# Patient Record
Sex: Male | Born: 1940 | Race: White | Hispanic: No | Marital: Married | Smoking: Former smoker
Health system: Southern US, Community
[De-identification: ages and names within clinical notes are randomized; demographics above are authoritative.]

## PROBLEM LIST (undated history)

## (undated) DIAGNOSIS — I5032 Chronic diastolic (congestive) heart failure: Secondary | ICD-10-CM

## (undated) DIAGNOSIS — I482 Chronic atrial fibrillation, unspecified: Secondary | ICD-10-CM

## (undated) DIAGNOSIS — E119 Type 2 diabetes mellitus without complications: Secondary | ICD-10-CM

## (undated) DIAGNOSIS — K56699 Other intestinal obstruction unspecified as to partial versus complete obstruction: Secondary | ICD-10-CM

## (undated) DIAGNOSIS — I2699 Other pulmonary embolism without acute cor pulmonale: Secondary | ICD-10-CM

## (undated) DIAGNOSIS — J9621 Acute and chronic respiratory failure with hypoxia: Secondary | ICD-10-CM

## (undated) DIAGNOSIS — K5120 Ulcerative (chronic) proctitis without complications: Secondary | ICD-10-CM

## (undated) DIAGNOSIS — K5190 Ulcerative colitis, unspecified, without complications: Secondary | ICD-10-CM

## (undated) DIAGNOSIS — U071 COVID-19: Secondary | ICD-10-CM

## (undated) DIAGNOSIS — Z85828 Personal history of other malignant neoplasm of skin: Secondary | ICD-10-CM

## (undated) DIAGNOSIS — I4891 Unspecified atrial fibrillation: Secondary | ICD-10-CM

## (undated) DIAGNOSIS — J84112 Idiopathic pulmonary fibrosis: Secondary | ICD-10-CM

## (undated) DIAGNOSIS — I2510 Atherosclerotic heart disease of native coronary artery without angina pectoris: Secondary | ICD-10-CM

## (undated) HISTORY — DX: Ulcerative colitis, unspecified, without complications: K51.90

## (undated) HISTORY — PX: OTHER SURGICAL HISTORY: SHX169

## (undated) HISTORY — DX: Personal history of other malignant neoplasm of skin: Z85.828

## (undated) HISTORY — DX: Unspecified atrial fibrillation: I48.91

## (undated) HISTORY — DX: Type 2 diabetes mellitus without complications: E11.9

## (undated) HISTORY — DX: Atherosclerotic heart disease of native coronary artery without angina pectoris: I25.10

## (undated) HISTORY — DX: Ulcerative (chronic) proctitis without complications: K51.20

---

## 2003-09-02 ENCOUNTER — Emergency Department (HOSPITAL_COMMUNITY): Admission: EM | Admit: 2003-09-02 | Discharge: 2003-09-02 | Payer: Self-pay | Admitting: Emergency Medicine

## 2003-09-02 ENCOUNTER — Encounter: Payer: Self-pay | Admitting: Emergency Medicine

## 2003-09-03 ENCOUNTER — Inpatient Hospital Stay (HOSPITAL_COMMUNITY): Admission: RE | Admit: 2003-09-03 | Discharge: 2003-09-11 | Payer: Self-pay | Admitting: Internal Medicine

## 2007-03-03 ENCOUNTER — Ambulatory Visit: Payer: Self-pay | Admitting: Gastroenterology

## 2007-03-03 LAB — CONVERTED CEMR LAB
ALT: 14 units/L (ref 0–40)
AST: 13 units/L (ref 0–37)
Albumin: 3.7 g/dL (ref 3.5–5.2)
Alkaline Phosphatase: 39 units/L (ref 39–117)
Amylase: 42 units/L (ref 27–131)
BUN: 15 mg/dL (ref 6–23)
Basophils Absolute: 0 10*3/uL (ref 0.0–0.1)
Basophils Relative: 0.5 % (ref 0.0–1.0)
Bilirubin, Direct: 0.1 mg/dL (ref 0.0–0.3)
CO2: 29 meq/L (ref 19–32)
Calcium: 9 mg/dL (ref 8.4–10.5)
Chloride: 106 meq/L (ref 96–112)
Creatinine, Ser: 0.9 mg/dL (ref 0.4–1.5)
Eosinophils Absolute: 0.3 10*3/uL (ref 0.0–0.6)
Eosinophils Relative: 3.3 % (ref 0.0–5.0)
Folate: >20 ng/mL
GFR calc Af Amer: 109 mL/min
GFR calc non Af Amer: 90 mL/min
Glucose, Bld: 108 mg/dL — ABNORMAL HIGH (ref 70–99)
HCT: 40.9 % (ref 39.0–52.0)
Hemoglobin: 14 g/dL (ref 13.0–17.0)
Lipase: 22 units/L (ref 11.0–59.0)
Lymphocytes Relative: 25.9 % (ref 12.0–46.0)
MCHC: 34.1 g/dL (ref 30.0–36.0)
MCV: 94.7 fL (ref 78.0–100.0)
Monocytes Absolute: 0.8 10*3/uL — ABNORMAL HIGH (ref 0.2–0.7)
Monocytes Relative: 9.6 % (ref 3.0–11.0)
Neutro Abs: 4.8 10*3/uL (ref 1.4–7.7)
Neutrophils Relative %: 60.7 % (ref 43.0–77.0)
Platelets: 310 10*3/uL (ref 150–400)
Potassium: 4.2 meq/L (ref 3.5–5.1)
RBC: 4.32 M/uL (ref 4.22–5.81)
RDW: 12.3 % (ref 11.5–14.6)
Sed Rate: 17 mm/hr (ref 0–20)
Sodium: 140 meq/L (ref 135–145)
TSH: 2.52 microintl units/mL (ref 0.35–5.50)
Tissue Transglutaminase Ab, IgA: <3 units (ref <5)
Total Bilirubin: 0.6 mg/dL (ref 0.3–1.2)
Total Protein: 6.6 g/dL (ref 6.0–8.3)
Vitamin B-12: 437 pg/mL (ref 211–911)
WBC: 8 10*3/uL (ref 4.5–10.5)

## 2007-03-06 ENCOUNTER — Encounter: Payer: Self-pay | Admitting: Gastroenterology

## 2007-03-10 ENCOUNTER — Encounter (INDEPENDENT_AMBULATORY_CARE_PROVIDER_SITE_OTHER): Payer: Self-pay | Admitting: Specialist

## 2007-03-10 ENCOUNTER — Ambulatory Visit: Payer: Self-pay | Admitting: Gastroenterology

## 2007-03-14 ENCOUNTER — Ambulatory Visit: Payer: Self-pay | Admitting: Cardiology

## 2008-07-31 ENCOUNTER — Ambulatory Visit: Payer: Self-pay | Admitting: Cardiology

## 2008-08-06 ENCOUNTER — Ambulatory Visit: Payer: Self-pay | Admitting: Cardiology

## 2008-08-13 ENCOUNTER — Ambulatory Visit: Payer: Self-pay | Admitting: Cardiology

## 2008-08-20 ENCOUNTER — Ambulatory Visit: Payer: Self-pay | Admitting: Cardiology

## 2008-08-23 ENCOUNTER — Ambulatory Visit: Payer: Self-pay | Admitting: Cardiology

## 2008-09-03 ENCOUNTER — Ambulatory Visit: Payer: Self-pay | Admitting: Cardiology

## 2009-01-13 ENCOUNTER — Emergency Department (HOSPITAL_COMMUNITY): Admission: EM | Admit: 2009-01-13 | Discharge: 2009-01-13 | Payer: Self-pay | Admitting: Emergency Medicine

## 2009-01-14 ENCOUNTER — Telehealth: Payer: Self-pay | Admitting: Gastroenterology

## 2009-03-15 ENCOUNTER — Ambulatory Visit: Payer: Self-pay | Admitting: Cardiology

## 2009-03-15 ENCOUNTER — Encounter: Payer: Self-pay | Admitting: Cardiology

## 2009-03-15 ENCOUNTER — Inpatient Hospital Stay (HOSPITAL_COMMUNITY): Admission: EM | Admit: 2009-03-15 | Discharge: 2009-03-19 | Payer: Self-pay | Admitting: Cardiology

## 2009-03-16 ENCOUNTER — Ambulatory Visit: Payer: Self-pay | Admitting: Gastroenterology

## 2009-03-16 ENCOUNTER — Encounter: Payer: Self-pay | Admitting: Cardiology

## 2009-04-02 ENCOUNTER — Encounter: Payer: Self-pay | Admitting: Cardiology

## 2009-04-09 ENCOUNTER — Ambulatory Visit: Payer: Self-pay | Admitting: Cardiology

## 2009-04-14 ENCOUNTER — Encounter: Payer: Self-pay | Admitting: Cardiology

## 2009-04-25 ENCOUNTER — Encounter: Payer: Self-pay | Admitting: Cardiology

## 2009-05-14 DIAGNOSIS — I482 Chronic atrial fibrillation, unspecified: Secondary | ICD-10-CM

## 2009-05-14 DIAGNOSIS — I2510 Atherosclerotic heart disease of native coronary artery without angina pectoris: Secondary | ICD-10-CM

## 2009-05-14 DIAGNOSIS — Z85828 Personal history of other malignant neoplasm of skin: Secondary | ICD-10-CM

## 2009-05-28 ENCOUNTER — Encounter (INDEPENDENT_AMBULATORY_CARE_PROVIDER_SITE_OTHER): Payer: Self-pay | Admitting: *Deleted

## 2009-06-23 ENCOUNTER — Encounter: Payer: Self-pay | Admitting: *Deleted

## 2009-12-12 ENCOUNTER — Telehealth (INDEPENDENT_AMBULATORY_CARE_PROVIDER_SITE_OTHER): Payer: Self-pay | Admitting: *Deleted

## 2009-12-16 ENCOUNTER — Encounter: Payer: Self-pay | Admitting: Cardiology

## 2010-03-30 ENCOUNTER — Ambulatory Visit: Payer: Self-pay | Admitting: Cardiology

## 2010-03-30 ENCOUNTER — Encounter: Payer: Self-pay | Admitting: Cardiology

## 2010-03-31 ENCOUNTER — Encounter: Payer: Self-pay | Admitting: Cardiology

## 2010-04-24 ENCOUNTER — Encounter: Payer: Self-pay | Admitting: Cardiology

## 2010-04-27 ENCOUNTER — Ambulatory Visit: Payer: Self-pay | Admitting: Cardiology

## 2010-04-30 ENCOUNTER — Telehealth (INDEPENDENT_AMBULATORY_CARE_PROVIDER_SITE_OTHER): Payer: Self-pay | Admitting: *Deleted

## 2010-05-07 ENCOUNTER — Encounter: Payer: Self-pay | Admitting: Physician Assistant

## 2010-05-07 ENCOUNTER — Ambulatory Visit: Payer: Self-pay | Admitting: Cardiology

## 2010-05-07 DIAGNOSIS — R0602 Shortness of breath: Secondary | ICD-10-CM | POA: Insufficient documentation

## 2010-05-07 DIAGNOSIS — R55 Syncope and collapse: Secondary | ICD-10-CM | POA: Insufficient documentation

## 2010-05-14 ENCOUNTER — Encounter: Payer: Self-pay | Admitting: Cardiology

## 2010-05-15 ENCOUNTER — Encounter: Payer: Self-pay | Admitting: Cardiology

## 2010-05-19 ENCOUNTER — Ambulatory Visit: Payer: Self-pay | Admitting: Cardiology

## 2010-05-21 ENCOUNTER — Encounter: Payer: Self-pay | Admitting: Cardiology

## 2010-05-25 ENCOUNTER — Encounter: Payer: Self-pay | Admitting: Cardiology

## 2010-05-28 ENCOUNTER — Telehealth (INDEPENDENT_AMBULATORY_CARE_PROVIDER_SITE_OTHER): Payer: Self-pay | Admitting: *Deleted

## 2010-06-03 ENCOUNTER — Encounter: Payer: Self-pay | Admitting: Cardiology

## 2010-06-08 ENCOUNTER — Encounter (INDEPENDENT_AMBULATORY_CARE_PROVIDER_SITE_OTHER): Payer: Self-pay | Admitting: *Deleted

## 2010-12-08 NOTE — Assessment & Plan Note (Signed)
Summary: per dr. Andee Lineman- seen in ER on 6-17  Medications Added NITROSTAT 0.4 MG SUBL (NITROGLYCERIN) 1 under tongue every up to 3 doses for chest pain,if no relief proceed to ED VALIUM 5 MG TABS (DIAZEPAM) Take 1 tablet by mouth three times a day as needed VICODIN 5-500 MG TABS (HYDROCODONE-ACETAMINOPHEN) as needed AMOXICILLIN 500 MG CAPS (AMOXICILLIN) Take 1 tablet by mouth three times a day AMLODIPINE BESYLATE 5 MG TABS (AMLODIPINE BESYLATE) Take 1 tablet by mouth once a day SIMVASTATIN 20 MG TABS (SIMVASTATIN) Take 1 tab by mouth at bedtime      Allergies Added: NKDA  Visit Type:  ED follow-up Primary Provider:  [facility]([place])   History of Present Illness: the patient is a 70 year old male who was recently seen in the emergency room department because of substernal chest pain.  The patient apparently was in the hyperbaric chamber for a wound to the left foot and had an episode of chest discomfort neck and throat pain.  The patient took two sublingual nitroglycerin which relieved his pain.  The patient denied any chest discomfort at the time when he was seen in the emergency room.  There are no acute ischemic changes his vital signs were within normal limits.  His oxygen saturation was within normal limits.  A cardiac component was within normal limits and the emergency room physician did not report any EKG changes.  Subsequently patient was scheduled for Cardiolite imaging study the patient had normal ejection fraction with no evidence of ischemia.  The patient states that since he has been discharged from emergency room has had no further chest pain.  He does report myalgias related to Lipitor.  He also reports some reflux symptoms.  The patient has a prior history of non-ST elevation myocardial infarction.  His last catheterization was in the 2010 and was found to have an occluded RCA at the prior stent site.yet good collaterals from the left to the right.  The  patient also reports significant anxiety.  The patient does have history of dizzy spells and has worn a Holter monitor in the past which showed no significant arrhythmia.    The patient is a prior history of pulmonary embolism and ulcerative colitis.  He also has permanent atrial fibrillation.  Interestingly however when his EKG during stress testing had normal sinus rhythm.  Preventive Screening-Counseling & Management  Alcohol-Tobacco     Smoking Status: quit     Year Quit: 1980's  Current Medications (verified): 1)  Apriso 0.375 Gm Xr24h-Cap (Mesalamine) .... Take 1 Tablet By Mouth Four Times A Day 2)  Aspir-Low 81 Mg Tbec (Aspirin) .... Take 1 Tablet By Mouth Once A Day 3)  Nitrostat 0.4 Mg Subl (Nitroglycerin) .Marland Kitchen.. 1 Under Tongue Every Up To 3 Doses For Chest Pain,if No Relief Proceed To Ed 4)  Valium 5 Mg Tabs (Diazepam) .... Take 1 Tablet By Mouth Three Times A Day As Needed 5)  Vicodin 5-500 Mg Tabs (Hydrocodone-Acetaminophen) .... As Needed 6)  Amoxicillin 500 Mg Caps (Amoxicillin) .... Take 1 Tablet By Mouth Three Times A Day 7)  Amlodipine Besylate 5 Mg Tabs (Amlodipine Besylate) .... Take 1 Tablet By Mouth Once A Day 8)  Simvastatin 20 Mg Tabs (Simvastatin) .... Take 1 Tab By Mouth At Bedtime  Allergies (verified): No Known Drug Allergies  Comments:  Nurse/Medical Assistant: The patient's medications and allergies were verbally reviewed with the patient and were updated in the Medication and Allergy Lists.  Past History:  Past Medical History: Last updated: 05/14/2009 ATRIAL FIBRILLATION (ICD-427.31) DM (ICD-250.00) SKIN CANCER, HX OF (ICD-V10.83) ULCERATIVE COLITIS (ICD-556.9) CAD (ICD-414.00)  Past Surgical History: Last updated: 05/14/2009  Multiple left leg surgeries secondary to   traumas, umbilical hernia repair  Family History: Last updated: 05/14/2009  No inflammatory bowel disease.  Grandfather had   colorectal cancer.   Social History: Last  updated: 05/14/2009  He is married.  He has 2 children.  He does not drink   alcohol, smoke cigarettes.   Social History: Smoking Status:  quit  Review of Systems       The patient complains of chest pain, dizziness, and anxiety.  The patient denies fatigue, malaise, fever, weight gain/loss, vision loss, decreased hearing, hoarseness, palpitations, shortness of breath, prolonged cough, wheezing, sleep apnea, coughing up blood, abdominal pain, blood in stool, nausea, vomiting, diarrhea, heartburn, incontinence, blood in urine, muscle weakness, joint pain, leg swelling, rash, skin lesions, headache, fainting, depression, enlarged lymph nodes, easy bruising or bleeding, and environmental allergies.    Vital Signs:  Patient profile:   70 year old male Height:      72 inches Weight:      257 pounds BMI:     34.98 Pulse rate:   75 / minute BP sitting:   144 / 73  (left arm) Cuff size:   large  Vitals Entered By: Carlye Grippe (April 27, 2010 10:34 AM)  Nutrition Counseling: Patient's BMI is greater than 25 and therefore counseled on weight management options.  Physical Exam  Additional Exam:  General: Well-developed, well-nourished in no distress head: Normocephalic and atraumatic eyes PERRLA/EOMI intact, conjunctiva and lids normal nose: No deformity or lesions mouth normal dentition, normal posterior pharynx neck: Supple, no JVD.  No masses, thyromegaly or abnormal cervical nodes lungs: Normal breath sounds bilaterally without wheezing.  Normal percussion heart: regular rate and rhythm with normal S1 and S2, no S3 or S4.  PMI is normal.  No pathological murmurs abdomen: Normal bowel sounds, abdomen is soft and nontender without masses, organomegaly or hernias noted.  No hepatosplenomegaly musculoskeletal: Back normal, normal gait muscle strength and tone normal pulsus: Pulse is normal in all 4 extremities Extremities: No peripheral pitting edema neurologic: Alert and oriented x  3 skin: Intact without lesions or rashes cervical nodes: No significant adenopathy psychologic: Normal affect    Impression & Recommendations:  Problem # 1:  ATRIAL FIBRILLATION (ICD-427.31) the patient reportedly has history of permanent atrial fibrillation however during his last stress testhe was in normal sinus rhythm.  In the emergency room however he was in atrial fibrillation.  The patient has a history of dizziness but reports no definite syncopal episodes or presyncope His updated medication list for this problem includes:    Aspir-low 81 Mg Tbec (Aspirin) .Marland Kitchen... Take 1 tablet by mouth once a day  Problem # 2:  CAD (ICD-414.00) the patient has known coronary artery disease.  He had chest pain in the hyperbaric chamber the cardiac enzymes were negative and there were no EKG changes.  We will intensify the patient's antianginal regimen with amlodipine 5 mg p.o. daily.  He will also be started on statin 20 mg p.o. nightly.  A lipid panel LFTs will be obtained in 4 weeks. His updated medication list for this problem includes:    Aspir-low 81 Mg Tbec (Aspirin) .Marland Kitchen... Take 1 tablet by mouth once a day    Nitrostat 0.4 Mg Subl (Nitroglycerin) .Marland Kitchen... 1 under tongue every up to 3 doses  for chest pain,if no relief proceed to ed    Amlodipine Besylate 5 Mg Tabs (Amlodipine besylate) .Marland Kitchen... Take 1 tablet by mouth once a day  Future Orders: T-Lipid Profile ([id number]) ... 05/18/2010 T-Hepatic Function [id number]) ... 05/18/2010  Problem # 3:  ULCERATIVE COLITIS (ICD-556.9) Assessment: Comment Only  Patient Instructions: 1)  Amlodipine 5mg  daily 2)  Simvastatin 20mg  at bedtime  3)  Labs just before next visit - already scheduled for Monday, July 18 at 3:15. 4)  Follow up in  as above. Prescriptions: SIMVASTATIN 20 MG TABS (SIMVASTATIN) Take 1 tab by mouth at bedtime  #30 x 6   Entered by:   Hoover Brunette, LPN   Authorized by:   Lewayne Bunting, MD, [facility]   Signed by:   Hoover Brunette, LPN on  24/40/1027   Method used:   Electronically to        [facility]  [address] [address] [id number] (retail)       [address]       [place], [place]  [address]       Ph: [phone] or [phone]       Fax: [phone]   RxID:   [id number] AMLODIPINE BESYLATE 5 MG TABS (AMLODIPINE BESYLATE) Take 1 tablet by mouth once a day  #30 x 6   Entered by:   Hoover Brunette, LPN   Authorized by:   Lewayne Bunting, MD, [facility]   Signed by:   Hoover Brunette, LPN on 32/20/2542   Method used:   Electronically to        [facility]  [address] [id number] (retail)       [address]       [place], [place]  [address]       Ph: [phone] or [phone]       Fax: [phone]   RxID:   [id number]   I have personnaly reviewed all medications changes and approved new prescriptions and refills. Lewayne Bunting, MD, [facility]  April 27, 2010 11:08 AM

## 2010-12-08 NOTE — Progress Notes (Signed)
Summary: d/c cardionet monitor   Phone Note Outgoing Call   Summary of Call: Spoke with daughter Irish Elders) States she is going to call [employer] to let them know she will be returning monitor.  Dad has wore x 1 wk. approximately.  States lead had came off, wasn't aware.  Can't hear monitor beeping so he took it off.   Hoover Brunette, LPN  May 28, 2010 11:35 AM   Follow-up for Phone Call        OK make sure we have the one week data.  Follow-up by: Lewayne Bunting, MD, [facility],  May 28, 2010 4:45 PM

## 2010-12-08 NOTE — Miscellaneous (Signed)
Summary: CHEST XRAY  Clinical Lists Changes  Orders: Added new Test order of T-Chest x-ray, 2 views ([id number]) - Signed

## 2010-12-08 NOTE — Letter (Signed)
Summary: Appointment -missed  [facility] [address]   [place], [place] [address]   Phone: [phone]  Fax: [phone]     May 25, 2010 MRN: [id number]     ALYAAN BUDZYNSKI [address] [place], [place]  [address]     Dear Mr. APUZZO,  Our records indicate you missed your appointment on May 25, 2010                        with Dr.  Andee Lineman.   It is very important that we reach you to reschedule this appointment. We look forward to participating in your health care needs.   Please contact us at the number listed above at your earliest convenience to reschedule this appointment.   Sincerely,    Glass blower/designer

## 2010-12-08 NOTE — Progress Notes (Signed)
Summary: Dgt concerned about syncope   Phone Note Call from Patient Call back at Home Phone [phone] Call back at [phone]   Summary of Call: Pt's dgt, Irish Elders, left message on my voicemail asking for a return call. She states she would like to discuss her father's condition with someone. She states he has been blacking out and isn't sure he's told us the "whole" situation. She states he has had multiple falls that he doesn't remember including a fall today. She wants to know if further testing needs to be done for this such as carotids or possibly a CT of the head.   Attempted to reach pt's dgt. Left message to notify pt that we could not discuss the pt's medical care with her because the pt has not signed a release of information stating we can speak to her. Notified pt's dgt in the message that nurse would send a message to the MD to notify him of her concerns but that any communication would have to be with the pt, unless he signs a release stating we can speak with her.  Initial call taken by: Cyril Loosen, RN, BSN,  April 30, 2010 5:05 PM  Follow-up for Phone Call        I suggest that we obtain aCardioNet monitor and a possible ventilation perfusion scan if he confirms that he truly had falls and syncope.  However this was not the history given to me in the office.  I saw the patient because he had chest pain in the hyperbaric chamber.  He does have a long-standing history of dizziness which has been evaluated several years ago with a Holter monitor.  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  I suggest we obtain a d-dimer level and then the VQ scan if positive.  Would also guaiaced and apply CardioNet monitor on him and have him see me back in 3 weeks again in the office.  A would also call the patient to make sure that he indeed has episodes of falls and loss of consciousness.  If not we should reschedule the appointment in  the next 4 couple of weeks which had been his daughter so he can get an adequate history. Follow-up by: Lewayne Bunting, MD, [facility],  April 30, 2010 6:57 PM  Additional Follow-up for Phone Call Additional follow up Details #1::        Daughter notified per Dr. Andee Lineman to bring in for OV to discuss above.  OV scheduled for 6/30 at 8:30.   Hoover Brunette, LPN  May 05, 2010 4:13 PM

## 2010-12-08 NOTE — Letter (Signed)
Summary: [facility] D/C DR. PARSONS  [facility] D[facility]C DR. [facility]SONS   Imported By: Zachary George 05/06/2010 18:26:18  _____________________________________________________________________  External Attachment:    Type:   Image     Comment:   External Document

## 2010-12-08 NOTE — Miscellaneous (Signed)
Summary: Orders Update - VQ Scan  Clinical Lists Changes  Orders: Added new Referral order of Nuclear Med (Nuc Med) - Signed

## 2010-12-08 NOTE — Assessment & Plan Note (Signed)
Summary: syncope --agh  Medications Added DIAZEPAM 10 MG TABS (DIAZEPAM) take one by mouth prior to HBO HYDROCODONE-ACETAMINOPHEN 10-500 MG TABS (HYDROCODONE-ACETAMINOPHEN) take 1-2 by mouth every 6-8 hours as needed BENADRYL 25 MG CAPS (DIPHENHYDRAMINE HCL) as needed      Allergies Added: NKDA  Visit Type:  Follow-up Primary Provider:  [facility]([place])  CC:  daughter want to discuss recent c/o SOB, fatigue, and frequent falls.  History of Present Illness: the patient is a 70 year old male with a history of substernal chest pain while in hyperbaric chamber for a wound to the left foot. He was evaluated on 6-22,001 in the office. He was in atrial fibrillation at that time. The patient also has a history of coronary artery disease. He was evaluated in emergency room however his EKG and enzymes were negative. Finger in spite of the patient's antianginal regimen with amlodipine and start him on statin. I did not feel he needs a stress test of time.  His daughter is now weaned him. She is a Engineer, civil (consulting) who works for Dr. Hyacinth Meeker in [place]. She is concerned about his frequent falls. The patient did not mention this during the last office visit. Apparently had an episode of fall last week when he was walking. He does not recall the circumstances it appeared to have blacked out and found himself on the ground. When he stood back up he felt quite dizzy. He denies any palpitations however. In the office today the patient again is quite drowsy and dizzy. However it took Valium, hydrocodone and several tendrils last night. Had a hard time waking up this morning. He has not been placed on Coumadin because of prior history of bleeding with ulcerative colitis. He also stated that yesterday had a bad episode of diarrhea as well as this morning. Recheck orthostatics in the office in a patient was not orthostatic.  The patient appeared confused in the office today and had some memory loss.  His daughter is concerned about from embolic disease from his atrial fibrillation which is a valid consideration. It is also unclear the patient has arrhythmias. He does have a prior history of PE and DVT but clinically did not appear to have a DVT in his legs.    Preventive Screening-Counseling & Management  Alcohol-Tobacco     Smoking Status: quit     Year Quit: 1980's  Current Medications (verified): 1)  Apriso 0.375 Gm Xr24h-Cap (Mesalamine) .... Take 1 Tablet By Mouth Four Times A Day 2)  Aspir-Low 81 Mg Tbec (Aspirin) .... Take 1 Tablet By Mouth Once A Day 3)  Nitrostat 0.4 Mg Subl (Nitroglycerin) .Marland Kitchen.. 1 Under Tongue Every Up To 3 Doses For Chest Pain,if No Relief Proceed To Ed 4)  Diazepam 10 Mg Tabs (Diazepam) .... Take One By Mouth Prior To Hbo 5)  Hydrocodone-Acetaminophen 10-500 Mg Tabs (Hydrocodone-Acetaminophen) .... Take 1-2 By Mouth Every 6-8 Hours As Needed 6)  Amoxicillin 500 Mg Caps (Amoxicillin) .... Take 1 Tablet By Mouth Three Times A Day 7)  Amlodipine Besylate 5 Mg Tabs (Amlodipine Besylate) .... Take 1 Tablet By Mouth Once A Day 8)  Simvastatin 20 Mg Tabs (Simvastatin) .... Take 1 Tab By Mouth At Bedtime 9)  Benadryl 25 Mg Caps (Diphenhydramine Hcl) .... As Needed  Allergies (verified): No Known Drug Allergies  Comments:  Nurse/Medical Assistant: The patient's medication bottles and allergies were reviewed with the patient and were updated in the Medication and Allergy Lists.  Past History:  Past Medical  History: Last updated: 05/14/2009 ATRIAL FIBRILLATION (ICD-427.31) DM (ICD-250.00) SKIN CANCER, HX OF (ICD-V10.83) ULCERATIVE COLITIS (ICD-556.9) CAD (ICD-414.00)  Past Surgical History: Last updated: 05/14/2009  Multiple left leg surgeries secondary to   traumas, umbilical hernia repair  Family History: Last updated: 05/14/2009  No inflammatory bowel disease.  Grandfather had   colorectal cancer.   Social History: Last updated:  05/14/2009  He is married.  He has 2 children.  He does not drink   alcohol, smoke cigarettes.   Risk Factors: Smoking Status: quit (05/07/2010)  Review of Systems       The patient complains of fatigue, shortness of breath, and fainting.  The patient denies malaise, fever, weight gain/loss, vision loss, decreased hearing, hoarseness, chest pain, palpitations, prolonged cough, wheezing, sleep apnea, coughing up blood, abdominal pain, blood in stool, nausea, vomiting, diarrhea, heartburn, incontinence, blood in urine, muscle weakness, joint pain, leg swelling, rash, skin lesions, headache, dizziness, depression, anxiety, enlarged lymph nodes, easy bruising or bleeding, and environmental allergies.    Vital Signs:  Patient profile:   70 year old male Height:      72 inches Weight:      256 pounds O2 Sat:      96 % on Room air Pulse rate:   77 / minute Pulse (ortho):   68 / minute BP sitting:   110 / 76  (left arm) BP standing:   119 / 78 Cuff size:   large  Vitals Entered By: Carlye Grippe (May 07, 2010 8:39 AM)  O2 Flow:  Room air  Serial Vital Signs/Assessments:  Time      Position  BP       Pulse  Resp  Temp     By 9:29 AM   Lying RA  114/76   62                    Gayle [address] LPN 1:61 AM   Sitting   124/78   [address] LPN 0:96 AM   Standing  119/78   68                    Gayle [place], LPN  Comments: 0:45 AM AFTER 2 MIN:  124/71  71 AFTER 3 MIN:  125/87  81 By: Hoover Brunette, LPN   CC: daughter want to discuss recent c/o SOB,fatigue,frequent falls   Physical Exam  Additional Exam:  General: Well-developed, well-nourished in no distress head: Normocephalic and atraumatic eyes PERRLA/EOMI intact, conjunctiva and lids normal nose: No deformity or lesions mouth normal dentition, normal posterior pharynx neck: Supple, no JVD.  No masses, thyromegaly or abnormal cervical nodes lungs: Normal breath sounds bilaterally without wheezing.  Normal  percussion heart: regular rate and rhythm with normal S1 and S2, no S3 or S4.  PMI is normal.  No pathological murmurs abdomen: Normal bowel sounds, abdomen is soft and nontender without masses, organomegaly or hernias noted.  No hepatosplenomegaly musculoskeletal: Back normal, normal gait muscle strength and tone normal pulsus: Pulse is normal in all 4 extremities Extremities: No peripheral pitting edema neurologic: Alert and oriented x 3 skin: Intact without lesions or rashes cervical nodes: No significant adenopathy psychologic: Normal affect    Impression & Recommendations:  Problem # 1:  SYNCOPE AND COLLAPSE (ICD-780.2) is not clear why the patient had syncope. He was not orthostatic. We will check  a cardiac monitor to rule out any significant arrhythmias. He has presumed normal LV function. The patient does have a history of non-ST elevation myocardial infarction with a chronically occluded RCA but with good left large collaterals. Given his history of pulmonary embolism we will also check a d-dimer and if positive we will proceed with lower extremity venous Dopplers. According to the Wells criteria his score is 1.5, in essence a score in between PE unlikely versus low risk. If the patient's lower extremity venous Dopplers are within normal limits that he can proceed with ventilation/perfusion scan. Unless the ventilation/perfusion scan is high probability, the diagnosis of PE can be refuted.I am also concerned the patient could be overmedicating himself with sedatives. His updated medication list for this problem includes:    Aspir-low 81 Mg Tbec (Aspirin) .Marland Kitchen... Take 1 tablet by mouth once a day    Nitrostat 0.4 Mg Subl (Nitroglycerin) .Marland Kitchen... 1 under tongue every up to 3 doses for chest pain,if no relief proceed to ed    Amlodipine Besylate 5 Mg Tabs (Amlodipine besylate) .Marland Kitchen... Take 1 tablet by mouth once a day  Orders: MRI without contrast (MRI w/o contrast) T- * Misc. Laboratory  test [id number]) Cardionet/Event Monitor (Cardionet/Event)  Problem # 2:  ATRIAL FIBRILLATION (ICD-427.31) the patient could have rapid tachyarrhythmias. In the office however atrial fibrillation has been controlled. We'll check a CardioNet monitor. We will also obtain an MRI MRA of the brain. The patient will need Valium 10 mg prior to study due to his claustrophobia. His updated medication list for this problem includes:    Aspir-low 81 Mg Tbec (Aspirin) .Marland Kitchen... Take 1 tablet by mouth once a day  Orders: T- * Misc. Laboratory test [id number]) Cardionet/Event Monitor (Cardionet/Event)  Problem # 3:  ULCERATIVE COLITIS (ICD-556.9) patient will start on Coumadin because of ulcerative colitis with bleeding episodes  Problem # 4:  CAD (ICD-414.00) no clear evidence of angina. Will hold off on stress testing for now. His updated medication list for this problem includes:    Aspir-low 81 Mg Tbec (Aspirin) .Marland Kitchen... Take 1 tablet by mouth once a day    Nitrostat 0.4 Mg Subl (Nitroglycerin) .Marland Kitchen... 1 under tongue every up to 3 doses for chest pain,if no relief proceed to ed    Amlodipine Besylate 5 Mg Tabs (Amlodipine besylate) .Marland Kitchen... Take 1 tablet by mouth once a day  Other Orders: Venous Duplex Lower Extremity (Venous Dup Lower E)  Patient Instructions: 1)  MRI/MRA of brain - no contrast, can take Valium 10mg  30 minutes before  2)  Lab:  D-Dimer 3)  Cardionet monitor  4)  Follow up in  3 months

## 2010-12-08 NOTE — Progress Notes (Signed)
Summary: med preload  Medications Added LIPITOR 40 MG TABS (ATORVASTATIN CALCIUM) Take 1 tablet by mouth once a day APRISO 0.375 GM XR24H-CAP (MESALAMINE) Take 1 tablet by mouth four times a day ASPIR-LOW 81 MG TBEC (ASPIRIN) Take 1 tablet by mouth once a day FERROUS SULFATE 325 (65 FE) MG TABS (FERROUS SULFATE) Take 1 tablet by mouth two times a day PREDNISONE 20 MG TABS (PREDNISONE) Take 1&1/2 tablet by mouth once a day LOMOTIL 2.5-0.025 MG TABS (DIPHENOXYLATE-ATROPINE) Take 1 tablet by mouth two times a day as needed COLOCORT 100 MG/60ML ENEM (HYDROCORTISONE) one enema PR at bedtime            New/Updated Medications: LIPITOR 40 MG TABS (ATORVASTATIN CALCIUM) Take 1 tablet by mouth once a day APRISO 0.375 GM XR24H-CAP (MESALAMINE) Take 1 tablet by mouth four times a day ASPIR-LOW 81 MG TBEC (ASPIRIN) Take 1 tablet by mouth once a day FERROUS SULFATE 325 (65 FE) MG TABS (FERROUS SULFATE) Take 1 tablet by mouth two times a day PREDNISONE 20 MG TABS (PREDNISONE) Take 1&1/2 tablet by mouth once a day LOMOTIL 2.5-0.025 MG TABS (DIPHENOXYLATE-ATROPINE) Take 1 tablet by mouth two times a day as needed COLOCORT 100 MG/60ML ENEM (HYDROCORTISONE) one enema PR at bedtime

## 2010-12-08 NOTE — Letter (Signed)
Summary: Appointment -missed  [facility] [address]   [place], [place] [address]   Phone: [phone]  Fax: [phone]     December 16, 2009 MRN: [id number]     KALIF KATTNER [address] [place], [place]  [address]     Dear Mr. CAVITT,  Our records indicate you missed your appointment on December 16, 2009                        with Dr.   Andee Lineman.   It is very important that we reach you to reschedule this appointment. We look forward to participating in your health care needs.   Please contact us at the number listed above at your earliest convenience to reschedule this appointment.   Sincerely,    Glass blower/designer

## 2010-12-08 NOTE — Letter (Signed)
Summary: Letter/ CLEARANCE  Letter/ CLEARANCE   Imported By: Dorise Hiss 05/04/2010 10:08:30  _____________________________________________________________________  External Attachment:    Type:   Image     Comment:   External Document

## 2010-12-08 NOTE — Letter (Signed)
Summary: Engineer, materials at [facility]  [address]   [place], [place] [address]   Phone: [phone]  Fax: [phone]        June 08, 2010 MRN: [id number]   KEEVIN PANEBIANCO [address] [place], [place]  [address]   Dear Mr. VIVIANI,  Your test ordered by Selena Batten has been reviewed by your physician (or physician assistant) and was found to be normal or stable. Your physician (or physician assistant) felt no changes were needed at this time.  ____ Echocardiogram  ____ Cardiac Stress Test  ____ Lab Work  ____ Peripheral vascular study of arms, legs or neck  ____ CT scan or X-ray  ____ Lung or Breathing test  __X__ Other:  Cardionet monitor - heart rate within normal limits & atrial fibrillation with rate control    Thank you.   Hoover Brunette, LPN    Duane Boston, M.D., F.A.C.C. Thressa Sheller, M.D., F.A.C.C. Oneal Grout, M.D., F.A.C.C. Cheree Ditto, M.D., F.A.C.C. Daiva Nakayama, M.D., F.A.C.C. Kenney Houseman, M.D., F.A.C.C. Jeanne Ivan, PA-C

## 2010-12-08 NOTE — Procedures (Signed)
Summary: Holter and Event/ CARDIONET END OF SERVICE SUMMARY REPORT  Holter and Event/ CARDIONET END OF SERVICE SUMMARY REPORT   Imported By: Dorise Hiss 06/05/2010 09:01:20  _____________________________________________________________________  External Attachment:    Type:   Image     Comment:   External Document  Appended Document: Holter and Event/ CARDIONET END OF SERVICE SUMMARY REPORT heart rate monitor within normal limits. atrial fibrillation with rate control.  Appended Document: Holter and Event/ CARDIONET END OF SERVICE SUMMARY REPORT Patient notified by letter.

## 2010-12-08 NOTE — Consult Note (Signed)
Summary: CARDIOLOGY CONSULT/ [facility]  CARDIOLOGY CONSULT/ [facility]   Imported By: Zachary George 05/06/2010 18:22:40  _____________________________________________________________________  External Attachment:    Type:   Image     Comment:   External Document

## 2011-02-16 LAB — CBC
HCT: 29 % — ABNORMAL LOW (ref 39.0–52.0)
HCT: 30.5 % — ABNORMAL LOW (ref 39.0–52.0)
Hemoglobin: 10 g/dL — ABNORMAL LOW (ref 13.0–17.0)
Hemoglobin: 10.4 g/dL — ABNORMAL LOW (ref 13.0–17.0)
Hemoglobin: 9.7 g/dL — ABNORMAL LOW (ref 13.0–17.0)
MCHC: 32.8 g/dL (ref 30.0–36.0)
MCHC: 33 g/dL (ref 30.0–36.0)
MCHC: 33.4 g/dL (ref 30.0–36.0)
MCV: 84 fL (ref 78.0–100.0)
MCV: 84.7 fL (ref 78.0–100.0)
Platelets: 307 10*3/uL (ref 150–400)
Platelets: 317 10*3/uL (ref 150–400)
Platelets: 349 10*3/uL (ref 150–400)
RBC: 3.77 MIL/uL — ABNORMAL LOW (ref 4.22–5.81)
RDW: 16.3 % — ABNORMAL HIGH (ref 11.5–15.5)
RDW: 16.3 % — ABNORMAL HIGH (ref 11.5–15.5)
RDW: 16.7 % — ABNORMAL HIGH (ref 11.5–15.5)
WBC: 12.3 10*3/uL — ABNORMAL HIGH (ref 4.0–10.5)
WBC: 12.6 10*3/uL — ABNORMAL HIGH (ref 4.0–10.5)
WBC: 15.2 10*3/uL — ABNORMAL HIGH (ref 4.0–10.5)

## 2011-02-16 LAB — CARDIAC PANEL(CRET KIN+CKTOT+MB+TROPI)
CK, MB: 35.5 ng/mL — ABNORMAL HIGH (ref 0.3–4.0)
CK, MB: 74.1 ng/mL — ABNORMAL HIGH (ref 0.3–4.0)
Relative Index: 17.5 — ABNORMAL HIGH (ref 0.0–2.5)
Relative Index: 20.8 — ABNORMAL HIGH (ref 0.0–2.5)
Relative Index: 22.2 — ABNORMAL HIGH (ref 0.0–2.5)
Total CK: 160 U/L (ref 7–232)
Total CK: 328 U/L — ABNORMAL HIGH (ref 7–232)
Total CK: 334 U/L — ABNORMAL HIGH (ref 7–232)
Troponin I: 0.88 ng/mL (ref 0.00–0.06)
Troponin I: 3.24 ng/mL (ref 0.00–0.06)
Troponin I: 4.88 ng/mL (ref 0.00–0.06)

## 2011-02-16 LAB — GLUCOSE, CAPILLARY
Glucose-Capillary: 121 mg/dL — ABNORMAL HIGH (ref 70–99)
Glucose-Capillary: 144 mg/dL — ABNORMAL HIGH (ref 70–99)
Glucose-Capillary: 163 mg/dL — ABNORMAL HIGH (ref 70–99)
Glucose-Capillary: 207 mg/dL — ABNORMAL HIGH (ref 70–99)
Glucose-Capillary: 240 mg/dL — ABNORMAL HIGH (ref 70–99)

## 2011-02-16 LAB — DIFFERENTIAL
Basophils Relative: 0 % (ref 0–1)
Lymphocytes Relative: 8 % — ABNORMAL LOW (ref 12–46)
Monocytes Absolute: 0.7 10*3/uL (ref 0.1–1.0)
Monocytes Relative: 6 % (ref 3–12)
Neutro Abs: 10.6 10*3/uL — ABNORMAL HIGH (ref 1.7–7.7)
Neutrophils Relative %: 86 % — ABNORMAL HIGH (ref 43–77)

## 2011-02-16 LAB — BASIC METABOLIC PANEL
BUN: 12 mg/dL (ref 6–23)
BUN: 12 mg/dL (ref 6–23)
BUN: 15 mg/dL (ref 6–23)
CO2: 24 mEq/L (ref 19–32)
CO2: 28 mEq/L (ref 19–32)
Chloride: 104 mEq/L (ref 96–112)
Chloride: 106 mEq/L (ref 96–112)
Chloride: 108 mEq/L (ref 96–112)
Creatinine, Ser: 0.76 mg/dL (ref 0.4–1.5)
GFR calc non Af Amer: >60 mL/min (ref >60)
Glucose, Bld: 128 mg/dL — ABNORMAL HIGH (ref 70–99)
Glucose, Bld: 148 mg/dL — ABNORMAL HIGH (ref 70–99)
Glucose, Bld: 189 mg/dL — ABNORMAL HIGH (ref 70–99)
Potassium: 4 mEq/L (ref 3.5–5.1)
Potassium: 4.2 mEq/L (ref 3.5–5.1)
Sodium: 138 mEq/L (ref 135–145)
Sodium: 139 mEq/L (ref 135–145)

## 2011-02-16 LAB — IRON AND TIBC
Saturation Ratios: 6 % — ABNORMAL LOW (ref 20–55)
TIBC: 333 ug/dL (ref 215–435)
UIBC: 314 ug/dL

## 2011-02-16 LAB — LIPID PANEL
HDL: 30 mg/dL — ABNORMAL LOW (ref >39)
Total CHOL/HDL Ratio: 4.6 RATIO
VLDL: 15 mg/dL (ref 0–40)

## 2011-02-16 LAB — HEMOGLOBIN A1C: Hgb A1c MFr Bld: 6.6 % — ABNORMAL HIGH (ref 4.6–6.1)

## 2011-02-18 LAB — BASIC METABOLIC PANEL
BUN: 10 mg/dL (ref 6–23)
Chloride: 103 mEq/L (ref 96–112)
Creatinine, Ser: 0.92 mg/dL (ref 0.4–1.5)
GFR calc non Af Amer: >60 mL/min (ref >60)
Glucose, Bld: 118 mg/dL — ABNORMAL HIGH (ref 70–99)
Potassium: 3.7 mEq/L (ref 3.5–5.1)

## 2011-02-18 LAB — CBC
HCT: 34.3 % — ABNORMAL LOW (ref 39.0–52.0)
MCV: 88 fL (ref 78.0–100.0)
Platelets: 286 10*3/uL (ref 150–400)
RDW: 15.9 % — ABNORMAL HIGH (ref 11.5–15.5)

## 2011-02-18 LAB — DIFFERENTIAL
Basophils Absolute: 0.1 10*3/uL (ref 0.0–0.1)
Eosinophils Absolute: 0.6 10*3/uL (ref 0.0–0.7)
Eosinophils Relative: 9 % — ABNORMAL HIGH (ref 0–5)
Neutrophils Relative %: 47 % (ref 43–77)
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
## 2011-03-23 NOTE — Cardiovascular Report (Signed)
NAME:  Ronald Mayo, Ronald Mayo NO.:  [id number]   MEDICAL RECORD NO.:  [id number]           PATIENT TYPE:   LOCATION:                                 FACILITY:   PHYSICIAN:  Marca Ancona, MD      DATE OF BIRTH:  1941-09-28   DATE OF PROCEDURE:  03/17/2009  DATE OF DISCHARGE:                            CARDIAC CATHETERIZATION   PROCEDURES:  1. Left heart catheterization.  2. Coronary angiography.  3. Left ventriculography.   INDICATION:  Non-ST elevation MI in a patient with known coronary artery  disease.  The patient additionally has ulcerative colitis with a flare  of his colitis and bloody diarrhea.  He did present with chest pain and  elevated troponin in addition to his GI symptoms.   PROCEDURE NOTE:  After informed consent was obtained, the right groin  was sterilely prepped and draped.  A 1% lidocaine was used to locally  anesthetize the right groin.  The right common femoral artery was  accessed using Seldinger technique and a 5-French arterial sheath was  placed.  The right coronary artery was engaged using the 5-French JR4  catheter.  The left coronary artery was engaged using the 5-French MP  catheter and the left ventricle was entered using the 5-French angled  pigtail catheter.  There were no complications.   FINDINGS:  1. Hemodynamics:  Aorta 93/59, left ventricle 100/18.  2. Left ventriculography:  EF was 55%, there did appear to be mild      inferior hypokinesis.  3. Coronary angiography:  The coronary system is right dominant.      There are stents in the RCA extending from the proximal RCA to the      mid RCA.  There is a total occlusion of the mid RCA within the      distal stent.  The PLV and the PDA fill by left-to-right      collaterals from the left coronary artery system.  The left main is      free from significant disease.  The proximal to mid LAD is heavily      calcified.  There is 40-50% mid LAD stenosis.  There is a small      first  diagonal with no significant disease.  There is a moderate      second diagonal with 50% ostial stenosis.  The distal LAD has mild      luminal irregularities.  The left circumflex system shows a small      diffusely diseased first obtuse marginal.  There is a large second      obtuse marginal with mild luminal irregularities.  The continuation      of the AV circumflex beyond the second obtuse marginal shows a 50%      stenosis.  This is a relatively small vessel.   ASSESSMENT AND PLAN:  This is a 70 year old with a history of ulcerative  colitis who presents with a flare of his ulcerative colitis and bloody  diarrhea in addition to chest pain and elevated cardiac enzymes  suggestive of non-ST elevation myocardial  infarction.  Heart  catheterization shows a totally occluded right coronary artery at the  site of the prior stent in the mid right coronary artery.  There are  collaterals from the left to the PDA and PLV.  This  lesion does not seem acute.  There is diffuse mild-to-moderate disease  in the left coronary system.  No stenosis appears greater than 50%.  We  will plan on medical management at this time.  He will continue on  aspirin 81 mg daily, Crestor 40 mg daily, and a beta-blocker.      Marca Ancona, MD  Electronically Signed     DM/MEDQ  D:  03/17/2009  T:  03/18/2009  Job:  [id number]

## 2011-03-23 NOTE — Assessment & Plan Note (Signed)
[facility]                          [facility] OFFICE NOTE   MERVIL, WACKER                       MRN:          [id number]  DATE:04/09/2009                            DOB:          Sep 29, 1941    HISTORY OF PRESENT ILLNESS:  The patient is very pleasant 70 year old  male with a history of prior non-ST-elevation myocardial infarction.  The patient has known coronary disease as well as ulcerative colitis.  The patient underwent a cardiac catheterization on Mar 17, 2009.  He was  found to have an occluded RCA at prior stent site.  He had good  collaterals from the left going to the right.  Otherwise, he had non  diffuse disease.  His EF was 55%.  The patient states he is doing well  now.  He has no chest pain, shortness of breath, orthopnea, or PND.  The  only problem that he reports is that he feels dizzy at times when  walking.  He has some lightheadedness, but no definite syncope.   MEDICATIONS:  1. Lipitor 40 mg p.o. nightly.  2. Apriso 0.375 ER q.i.d.  3. Aspirin 81 daily.  4. Ferrous sulfate 325 mg p.o. b.i.d.  5. Prednisone 20 mg one-and-half daily.  6. Diphenoxylate and atropine 1 tablet p.o. b.i.d.  7. Hydrocortisone enema nightly.   PHYSICAL EXAMINATION:  VITAL SIGNS:  Blood pressure 117/71, heart rate  70, and weight 225 pounds.  NECK:  Normal carotid upstroke and no carotid bruits.  LUNGS:  Clear breath sounds bilaterally.  HEART:  Regular rate and rhythm.  Normal S1 and S2.  No murmur, rubs or  gallops.  ABDOMEN:  Soft and nontender.  No rebound or guarding.  Good bowel  sounds.  EXTREMITIES:  No cyanosis, clubbing, or edema.  NEUROLOGIC:  The patient is alert and oriented.  Grossly nonfocal.   PROBLEM LIST:  1. Coronary artery disease.  See details above.  Status post cardiac      catheterization.  2. Prior history of pulmonary embolism.  3. Ulcerative colitis.  4. Dizziness and weakness, rule out arrhythmia.  5.  Permanent atrial fibrillation.  6. The patient is not Coumadin candidate as previously documented due      to bleeding from his ulcerative colitis.   PLAN:  1. At this point in time, we will further investigate the dizzy spells      the patient has with a Holter monitor as they occur on a daily      basis particularly when he is walking.  2. We will also obtain EKG in the office.  3. The patient has requested a refill on Xanax and I gave prescription      for 30 days only.  4. The patient will follow up with [place] after review of the Holter      monitor.     Learta Codding, MD,FACC  Electronically Signed    GED/MedQ  DD: 04/09/2009  DT: 04/10/2009  Job #: [id number]
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
## 2011-03-23 NOTE — Assessment & Plan Note (Signed)
[facility]                          [facility] NOTE   Ronald Mayo, Ronald Mayo                       MRN:          [id number]  DATE:08/23/2008                            DOB:          07-02-1941    PRIMARY CARDIOLOGIST:  Learta Codding, MD,FACC (new).   REASON FOR VISIT:  Post-hospital followup.   Ronald Mayo is a pleasant 70 year old male, new to our practice here in  [place], but whom we just saw recently here at [facility] in  consultation.  He presented with right-sided pleuritic chest pain and  was found to have acute, bilateral pulmonary emboli.  He was started on  Coumadin, which he had been on the past for paroxysmal atrial  fibrillation, and was discharged on Lovenox overlap.  He has since been  following in our Coumadin Clinic and has expressed a desire to establish  with our cardiology group here in [place].   Ronald Mayo does have coronary artery disease, having suffered prior  myocardial infarctions, and has been treated with coronary artery  stenting in [place].  During this admission, serial cardiac markers  were all within normal limits.   Ronald Mayo also has atrial fibrillation, which now appears permanent.  He  had previously been treated with Coumadin, but this was then  discontinued secondary to hematochezia from ulcerative colitis.  Our  recommendation during this recent brief stay was to not resume low-dose  aspirin, given the increased risk of bleeding in conjunction with  Coumadin, which he would need to remain on indefinitely.   Ronald Mayo was also placed on an ACE inhibitor, but this was then  discontinued secondary to relative hypotension.  He also is not on a  statin and also has diabetes mellitus.   Clinically, the patient has been doing quite well.  He denies any chest  pain, tachy palpitations, PND, or orthopnea.  He does, however, have  bilateral lower extremity edema, which appears to be chronic.   EKG in our  office today indicates atrial fibrillation at 59 bpm with no  acute changes.   CURRENT MEDICATIONS:  1. Metoprolol 50 b.i.d.  2. Coumadin 5 mg, as directed.  3. Actos 15 daily.  4. Glimepiride 4 daily.  5. Asacol 800 mg t.i.d.   PHYSICAL EXAMINATION:  VITAL SIGNS:  Blood pressure 115/68, pulse 60,  irregular, and weight 250.8.  GENERAL:  A 70 year old male, obese, sitting upright, and in no  distress.  HEENT:  Normocephalic, atraumatic.  NECK:  Palpable carotid pulses without bruits; no JVD at 90 degrees.  LUNGS:  Mild, bibasilar crackles without wheezes.  HEART:  Irregular irregular.  No significant murmurs.  ABDOMEN:  Protuberant, nontender.  EXTREMITIES:  A 2+ bilateral, nonpitting edema.  NEURO:  No focal deficit.   Ronald Mayo did have a 2-D echo which suggested preserved LVF (EF 55-60%).  However, this was also interpreted as indicating biventricular  enlargement, thus prompting the consultation.  The study was interpreted  by Dr. Andee Lineman, who noted normal wall motion but with moderately severe  dilatation of both ventricles.  No significant valvular  abnormalities  were noted, and there was mild pulmonary hypertension.   IMPRESSION:  1. Status post acute bilateral pulmonary emboli.      a.     Coumadin initiated.  2. Cardiomyopathy.      a.     Moderately severe biventricular enlargement/preserved left       ventricular function (EF 55-60%).  3. Permanent atrial fibrillation.      a.     Previously on Coumadin, subsequently discontinued secondary       to hematochezia/ulcerative colitis.  4. Coronary artery disease, quiescent.      a.     History of myocardial infarctions/coronary artery stenting       in [place].  5. Relative hypotension on ACE inhibitor.  6. Type 2 diabetes mellitus.  7. Bilateral lower extremity edema.  8. Ulcerative colitis.  9. Permanent atrial fibrillation.      a.     Well-controlled on metoprolol.   PLAN:  1. Continue Coumadin indefinitely,  to be monitored and managed in our      clinic.  Consequently, it has been recently noted that the patient      is not to be placed back on low-dose aspirin, given the increased      risk of bleeding with his history of ulcerative colitis, on dual      therapy.  2. We would strongly suggest discontinuation of Actos and      consideration of an alternative diabetic oral agent (i.e.      metformin), given his significant cardiovascular disease.      Moreover, he presents with persistent lower extremity edema, which      can clearly be exacerbated by Actos.  3. We will start Lasix 40 mg daily for treatment of lower extremity      edema, as well as prophylaxis for chronic congestive heart failure.  4. Followup BMET in 1 week.  We will also check a fasting lipid      profile.  5. Schedule return clinic followup with myself and Dr. Andee Lineman in 3      months.      Gene Serpe, PA-C  Electronically Signed      Learta Codding, MD,FACC  Electronically Signed   GS/MedQ  DD: 08/23/2008  DT: 08/24/2008  Job #: [id number]   cc:   Dr. Molly Maduro

## 2011-03-23 NOTE — H&P (Signed)
NAME:  Ronald Mayo, Ronald Mayo NO.:  [id number]   MEDICAL RECORD NO.:  [id number]          PATIENT TYPE:  INP   LOCATION:  2923                         FACILITY:  [facility]   PHYSICIAN:  Rollene Rotunda, MD, FACCDATE OF BIRTH:  10-13-1941   DATE OF ADMISSION:  03/15/2009  DATE OF DISCHARGE:                              HISTORY & PHYSICAL   PRIMARY:  None.   CARDIOLOGIST:  In [place], [place].   REASON FOR PRESENTATION:  The patient with chest pain.   HISTORY OF PRESENT ILLNESS:  The patient is a very pleasant 70 year old  gentleman with a history of coronary disease.  He apparently has had  stents on a couple of occasions in [place].  He reports three  catheterizations with last one being a couple of years ago.  I have none  of these records.  He has not had any cardiac evaluation since a  catheterization.  He does not really take any cardiac meds.  He has not  been having any chest discomfort.  He is limited in his activities from  an old leg wound requiring multiple surgeries.  He does a little bit of  ambulation.  He gets around in his yard.  He had not been having any  recent chest discomfort.  He does have ulcerative colitis and had a  recent flare with GI bleeding yesterday reporting red blood per rectum.  He actually went to [facility] on Friday.  He said he was in the ER all night  long.  They sent him home with some steroids and plans to followup.  He  did not sleep.  He was out in his yard and then came back in this  afternoon and ate something.  He then developed 9/10 chest discomfort.  He reported it as substernal and heavy.  He had some radiation to his  jaw and to his arms.  It was similar to his previous angina.  It was not  like reflux or other pain.  He had some old nitroglycerin that may not  have been potent.  he took these and it did not help.  He called EMS and  reportedly was very hypertensive with systolic in the 240s by his  report.  He get some  nitroglycerin by EMS and was transported to  [place].  There, he was still very uncomfortable.  He was treated with  IV nitroglycerin and morphine.  His pain came down to 5/10.  His EKG was  nonacute, though it shows chronic AFib.  His enzymes were negative.  Because of a past history of pulmonary emboli, he did have a CT and  there was no evidence of pulmonary embolism on this.  He was transferred  to [facility] where he is now pain free and feeling much better.  He did  have some shortness of breath with this discomfort.  He did have some  nausea.  He was apparently very diaphoretic at one point as well.   PAST MEDICAL HISTORY:  Coronary artery disease (no data available),  ulcerative colitis (rectal bleeding on Friday.  He has been  treated with  a steroid taper and was given more steroids at [facility]), history of  pulmonary embolism about 6 months ago (he had to come off Coumadin  because of rectal bleeding), skin cancer, diabetes mellitus (he has not  taken any medications), chronic atrial fibrillation; not Coumadin  candidate.   PAST SURGICAL HISTORY:  Multiple left leg surgeries secondary to  traumas, umbilical hernia repair.   ALLERGIES:  None.   MEDICATIONS:  This patient was taking Asacol as diabetes medication.  He  has not been taking any of these.  He has been taking steroids.   FAMILY HISTORY:  None.   SOCIAL HISTORY:  He is married.  He has 2 children.  He does not drink  alcohol, smoke cigarettes.   REVIEW OF SYSTEMS:  As stated in the HPI and positive for chronic  diarrhea with recent bright red blood per rectum.  Negative for all  other systems.   PHYSICAL EXAMINATION:  GENERAL:  The patient is pleasant and in no  distress.  VITAL SIGNS:  Blood pressure 119/66, heart rate 73 and irregular.  HEENT:  Eyelids unremarkable, pupils equal, round, and reactive to  light, fundi not visualized, oral mucosa unremarkable.  NECK:  No jugular venous distention at 45 degrees,  carotid upstroke  brisk and symmetric, no bruits, no thyromegaly.  LYMPHATICS:  No cervical, axillary, inguinal adenopathy.  LUNGS:  Clear to auscultation bilaterally.  BACK:  No costovertebral angle tenderness.  CHEST:  Unremarkable.  HEART:  PMI not displaced or sustained, S1 and S2 within normal limits.  No S3, no clicks, no rubs, no murmurs.  ABDOMEN:  Obese, positive bowel sounds normal in frequency and pitch.  No bruits, rebound, or guarding.  No midline pulsatile mass.  No  hepatomegaly, no splenomegaly.  SKIN:  No rashes, no nodules.  EXTREMITIES:  Pulses 2+ throughout, chronic left lower extremity muscle  wasting.  NEUROLOGIC:  Oriented to person, place, and time, cranial nerves II  through XII grossly intact, motor grossly intact.   EKG, atrial fibrillation, rate 70s, axis within normal, intervals within  normal limits, no acute ST wave changes.   LABORATORY DATA:  WBC 8.6, hemoglobin 10.5, platelets 416, sodium 39,  potassium 4.5, BUN 15, creatinine 1.1.   Chest CT negative for pulmonary emboli.   Chest x-ray, mild left atelectasis.   ASSESSMENT/PLAN:  1. Chest, the patient's chest comfort is consistent with unstable      angina.  Given this, he will need a cardiac catheterization.  I      will keep him on IV nitroglycerin.  He will get aspirin.  Avoid      heparin because of his rectal bleeding.  If he needs a stent, it      would need to be a non drug-eluting stent.  We have to convince him      to take Plavix; however, prior to considering this.  He will need      secondary risk reduction.  2. Ulcerative colitis.  We will put him on 60 mg of prednisone and      follow the taper suggestions prescribed at [facility].  3. Atrial fibrillation.  He is not a Coumadin candidate.  He seems to      have reasonable rate control.  I will continue beta blocker.  4. Diabetes.  Use sliding scale insulin.  Check hemoglobin A1c.  5. Risk reduction.  We will check a lipid profile.  At  that, he will  take a statin.  We will try to talk him into this.     Rollene Rotunda, MD, [facility]  Electronically Signed    JH/MEDQ  D:  03/15/2009  T:  03/16/2009  Job:  [id number]

## 2011-03-26 NOTE — Consult Note (Signed)
NAME:  Ronald Mayo, Ronald Mayo                          ACCOUNT NO.:  [id number]   MEDICAL RECORD NO.:  [id number]                  PATIENT TYPE:   LOCATION:                                       FACILITY:   PHYSICIAN:  Lionel December, M.D.                 DATE OF BIRTH:  04-13-1941   DATE OF CONSULTATION:  08/27/2003  DATE OF DISCHARGE:                                   CONSULTATION   PRESENTING COMPLAINT:  Persistent bloody diarrhea.   HISTORY OF PRESENT ILLNESS:  Ronald Mayo is a 70 year old Caucasian male, patient  of Dr. Margo Common, was seen in our office last week with persistent diarrhea of  two months duration.  He has history of ulcerative colitis which was  diagnosed about three years ago.  His last colonoscopy was by Dr. Allena Katz in  [place] and he was treated with Rowasa enemas and Asacol.  Along the way,  he also received Medrol Dosepak and rapidly improved.  He stopped his Asacol  after a few months.  When he presented last week with bloody diarrhea, we  felt that he had relapse of his ulcerative colitis.  Even though he has been  having frequent bowel movements, he did not appear to be sick or toxic.  We  requested his records from Dr. Eliane Decree office but have not received them  yet.  He was begun on prednisone 30 mg q.a.m.  He states he is not feeling  any better.  He states he is having 25-30 bowel movements daily.  He has  both nocturnal and diarrheal diarrhea, probably some because he has small  amount of liquid bloody stool.  He has not had a formed stool in over two  months.  He also complains of lower abdominal discomfort and bowel urgency.  He has not lost any weight in the last five days.  He denies nausea,  vomiting, fever or chills.  He actually has a good appetite.  He has  discontinued his Coumadin which he has been taking because of history of  cerebral vascular accidents.   CURRENT MEDICATIONS:  1. Amaryl 2 mg b.i.d.  2. ASA 81 mg daily.  3. Plavix 75 mg daily.  4.  Zocor 20 mg daily.  5. Altace 5 mg daily.  6. Betapace 80 mg b.i.d.  7. Tricor 160 mg daily.  8. Hematinic MVI daily.  9. Prednisone 30 mg daily.  10.      Bentyl 10 mg t.i.d.  11.      Imodium OTC on p.r.n. basis.   Glucose has been running a little over 200.  It has gone up since he has  been on prednisone.   PAST MEDICAL HISTORY:  1. Diabetes mellitus for at least 15 years.  2. Coronary artery disease.  3. Myocardial infarction in 1990 and again, in 1994 and he had coronary     stent or stents placed last  year.  4. Hypercholesterolemia.  5. Left knee arthroscopy.  6. Left ankle and heel surgery.  7. History of having had a cerebral vascular accident in the past.   ALLERGIES:  None known.   FAMILY HISTORY:  Negative for inflammatory bowel disease.   SOCIAL HISTORY:  He is married and has children.  He is retired.  He smokes  cigarettes, up to three packs a day but quit five years ago.  He drinks  alcohol occasionally.  Previously, he used to alcohol in excessive amount,  but that was over 20 years ago.   PHYSICAL EXAMINATION:  GENERAL:  A pleasant, well-developed, mildly obese  Caucasian male who is in no acute distress.  He weighs 261 pounds.  His  weight on August 23, 2003 was 262 pounds.  He is 6 feet tall.  VITAL SIGNS:  Pulse 86 per minute, blood pressure 100/70.  He is afebrile.  HEENT:  Conjunctivae is pink, sclerae is nonicteric.  NECK:  Without adenopathy or thyromegaly.  ABDOMEN:  Obese, bowel sounds are normal.  Palpation reveals mild tenderness  at left lower quadrant, no organomegaly or masses.  RECTAL:  Deferred.  EXTREMITIES:  Does not have peripheral edema or clubbing.   ASSESSMENT:  Ronald Mayo's bloody diarrhea is felt to be due to relapse of  ulcerative colitis.  Gastrointestinal records have not been received today.  He is having frequent bowel movements, however, these are small volume and  he does not appear to be dehydrated or toxic.  He apparently has  been  treated for hemorrhoids with topical steroids but that has not had a benefit  at present.  Rectal bleeding is a concern.  I do not feel that he is  bleeding from hemorrhoids.   RECOMMENDATIONS:  Will arrange for a total colonoscopy to be performed next  week.  He will continue prednisone at a dose of 20 or 30 mg per day.  If his  glucose levels remain high, he can drop the dose to 20, otherwise, stay on  30 until his colonoscopy.  Will start him on Asacol at 1.2 grams p.o. b.i.d.  and Rowasa enema one per rectum at bedtime.  Five doses given to the  patient.  Further recommendation will be made based on endoscopic findings.         ___________________________________________                                            Lionel December, M.D.   NR/MEDQ  D:  08/27/2003  T:  08/28/2003  Job:  [id number]   cc:   Wyvonnia Lora, M.D.

## 2011-03-26 NOTE — Letter (Signed)
April 18, 2007    Mr. Dionne Ano. [employer] [address]  [place], [place]  [address]   RE:  DETRICK, DANI  MRN:  [id number]  /  DOB:  Jul 04, 1941   Dear Mr. Brallier:   It is imperative that you return to our office for followup of your  colonoscopy if you wish to have your rather severe colitis and partial  colonic obstruction treated.  We have not been able to reach you since  your colonoscopy with biopsies on Mar 10, 2007.  Please contact our  office for followup appointment.  If you have any problems with our  office and would like another physician, we would be glad to forward you  the name of competent gastrologists in this area.   If we do not hear from you in the next several weeks, we will be forced  to withdraw from your medical care because of noncompliance.    Sincerely,      Vania Rea. Jarold Motto, MD, Caleen Essex, FAGA  Electronically Signed    DRP/MedQ  DD: 04/18/2007  DT: 04/18/2007  Job #: [id number]   CC:    Arta Bruce, MD

## 2011-03-26 NOTE — Op Note (Signed)
NAME:  Ronald, Mayo                          ACCOUNT NO.:  [id number]   MEDICAL RECORD NO.:  [id number]                   PATIENT TYPE:  INP   LOCATION:  A326                                 FACILITY:  [facility]   PHYSICIAN:  Lionel December, M.D.                 DATE OF BIRTH:  1941/04/18   DATE OF PROCEDURE:  09/03/2003  DATE OF DISCHARGE:                                 OPERATIVE REPORT   PROCEDURE:  Total colonoscopy.   INDICATIONS FOR PROCEDURE:  Ronald Mayo is a 70 year old Caucasian male who is  undergoing diagnostic colonoscopy.  He has history of ulcerative colitis,  but he discontinued his Asacol.  He was seen in the office on 08/23/2003.  He was begun on prednisone 30 mg q.a.m. and Bentyl, and we requested his  records.  He came to the office on 08/27/2003 when I saw him.  Plans were  made for him to undergo colonoscopy.  He was asked to stay on prednisone 20  to 30 mg per day unless his glucose levels were high, in which case he would  drop the dose.  He was begun on Asacol 1.2 g p.o. b.i.d. as well as Rowasa  enemas.  He is not doing well.  He stopped his prednisone about three days  ago on his own.  He apparently was in the emergency room yesterday afternoon  and was seen by Dr. Rosalia Hammers.  He did not tell her that he was being treated for  UC.  They did a CT and found that his colon was thick, and then the patient  informed her that he is being treated for UC.  She felt he was stable to go  home and to return for outpatient colonoscopy.   The procedure and risks were reviewed with the patient, and informed consent  was obtained.   PREOPERATIVE MEDICATIONS:  Demerol 25 mg IV, Versed 4 mg IV.   FINDINGS:  The procedure was performed in the endoscopy suite.  The  patient's vital signs and O2 saturations were monitored during the procedure  and remained stable.  The patient was placed in the left lateral recumbent  position and rectal examination performed.  No abnormality noted on  external  or digital exam.  There was some blood and mucous on the gloved finger.  The  Olympus videoscope was placed into the rectum where the mucosa was noted to  be diffusely involved with inflammatory process.  There was friability,  edema, loss of vascularity, erosions, and ulcers.  The scope was passed to  the sigmoid colon which was also virtually completely involved.  The scope  was carefully passed into the proximal transverse colon.  The transverse  colon was also involved in this acute process.  I did not attempt to advance  the scope to the cecum.  A stool sample was taken and sent to the lab for C.  diff toxin, __________ culture, and O&P.  Biopsy was taken from the distal  sigmoid colon and rectum and submitted in separate containers.  I did not  attempt retroflexion.  The endoscope was withdrawn.  The patient tolerated  the procedure well.   FINAL DIAGNOSIS:  Acute colitis, felt to be ulcerative colitis.  Considering  his symptoms, I feel he has permanent disease.   RECOMMENDATIONS:  1. He will be admitted for IV therapy.  Will start him on Solu-Medrol 80 mg     IV q.12h., and until stool studies are back, will also place him on Cipro     4 mg IV q.12h. and Flagyl 500 mg IV q.6h.  2. Use Nubain for analgesia and Phenergan for nausea.  3. Will hold his aspirin, Plavix, and other medications but leave him on his     Altace and Betapace.  4. CBC and Chem 20 will be checked today.  5. Will start him on sliding scale insulin coverage.  May consider a low     dose long-acting insulin therapy after 24 hours.  6. He will be closely monitored for signs of worsening or abdominal     distension.  We will also need to watch him for toxic megacolon.      ___________________________________________                                            Lionel December, M.D.   NR/MEDQ  D:  09/03/2003  T:  09/03/2003  Job:  [id number]   cc:   Wyvonnia Lora  [address]  [place]  [place]  [address]  Fax: [phone]

## 2011-03-26 NOTE — Discharge Summary (Signed)
NAME:  Ronald Mayo, Ronald Mayo                          ACCOUNT NO.:  [id number]   MEDICAL RECORD NO.:  [id number]                   PATIENT TYPE:  INP   LOCATION:  A326                                 FACILITY:  [facility]   PHYSICIAN:  Tana Coast, P.A.                  DATE OF BIRTH:  [birth year]   DATE OF ADMISSION:  09/03/2003  DATE OF DISCHARGE:  08/17/202004                                 DISCHARGE SUMMARY   ADMISSION PRIMARY DIAGNOSIS:  Fulminate ulcerative colitis.   SECONDARY DIAGNOSES:  Diabetes mellitus.   DISCHARGE DIAGNOSES:  1. Fulminate ulcerative colitis.  2. Diabetes mellitus.   PROCEDURE:  On September 03, 2003 colonoscopy by Dr. Lionel December revealed  acute colitis, thought to be ulcerative colitis based on findings and  patient's symptoms it was felt that he needed to be admitted for IV therapy  including Solu-Medrol.   HISTORY OF PRESENT ILLNESS:  Ronald Mayo is a 70 year old gentleman, patient of  Dr. Margo Common. He was seen by Dr. Karilyn Cota, in the office, a week prior to his  admission for persistent diarrhea of 2 months duration.  He had a history of  ulcerative colitis diagnosed 3 years prior.  He had been treated with Rowasa  enemas and Asacol by Dr. Allena Katz in [place].  He stopped his Asacol a few  months ago.  He had relapse with bloody diarrhea.  The patient was advised  to continue prednisone 20 or 30 mg daily and to resume his Asacol 1.2 gm  b.i.d. and Rowasa enemas 1 per rectum.  He was arranged for outpatient  colonoscopy.  On the day of colonoscopy, based on his endoscopic findings  and patient's symptoms he was told that he needed to have admission with IV  therapy.   HOSPITAL COURSE:  The patient was admitted on September 03, 2003 after  colonoscopy for IV therapy. He was started on  Solu-Medrol 80 mg q.12h.,  Cipro 400 mg IV q.12h. and Flagyl 500 mg IV q.6h.  Within the patient became  feeling better and had less lower abdominal pain. He continued to pass some  blood in his stools.  Admission labs revealed a WBC of 12.3, hemoglobin 15,  hematocrit 44.3, platelets 76,000.  Glucose was 162.  Albumin 2.9.  LFTs  were otherwise normal.  C. difficile toxin titer was negative as well as  stool culture and O&P.  Pathology from colonoscopy revealed acute colitis  with crypt abscesses.  The patient was felt to have thrombocytopenia  secondary to a GC.  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  Gradually, with treatment his platelet count did return  to normal.  It was 296,000 at the time of discharge.  Throughout his  hospital stay did increase.  It was felt to be due to the steroids.  It was  17,300 at the time of discharge.  By day 2 of the hospitalization the patient continued to feel well. He was  switched to oral Flagyl and Solu-Medrol dose was decreased.  He was given  insulin given elevated glucose levels.  He continued to do well on IV Solu-  Medrol, but when switched to oral prednisone on day 3 of the hospitalization  he was noted to have a set back; passing blood per rectum and having  abdominal cramps and diarrhea.  He received a total of 4 more days of IV  Solu-Medrol prior to discharge.  He gradually continued to improve, and  slowly improved throughout the hospital stay; and, at the time of discharge,  was having a very scant amount of hematochezia and only 2-3 loose bowel  movements daily. He noted much improvement in his abdominal pain.  It was  felt, given that he was going to be on steroids and with his diabetes  mellitus that he would have to go home with insulin for better control.   Other pertinent labs included:  INR of 1.2.  On the day of discharge the BUN  was 17, creatinine 1, glucose 197, potassium 4.8, sodium 137.  Hemoglobin  14.4, hematocrit 43.6, platelets 296,000.   The patient was discharged on September 11, 2003.  He was advised to resume a  modified carbohydrate diet with low residue.  He was  advised to discontinue  his Plavix, aspirin, Amaryl and Altace.  He would continue his Zocor,  Betapace, Tricor, multivitamin, Imodium and Bentyl.  He was sent home on  insulin 70/30 to take 14 units subcu q.a.m., prescription provided. He was  asked to notify MD if his blood glucose level was greater than 200.   DISCHARGE MEDICATIONS:  1. Also he was sent home on Rowasa enemas to take 1 per rectum q.h.s.,     prescription for #14 given.  2. Flagyl 250 mg 1 p.o. t.i.d. for the next 7 days, prescription provided.  3. Prednisone 10 mg to take 4 tablets in the morning and 2 tablets in the     evening, #100 with 1 refill given.  4. Vicodin 5/500 mg one tablet 3 times a day as needed, prescription given     for #20 with 0 refills.  5. He is to resume his Asacol 3 tablets twice a day.   DISCHARGE INSTRUCTIONS:  1. Appointment was made for him to follow up at our office on September 19, 2003 at 11:30 a.m.  2. The patient was instructed to notify the doctor if his blood glucose     level was greater than 200;  if he had continued abdominal pain,     worsening diarrhea, or blood in his stool.   CONDITION:  At the time of discharge he was deemed to be stable and was  discharged to home.     ___________________________________________                                         Tana Coast, P.A.   LL/MEDQ  D:  09/12/2003  T:  09/12/2003  Job:  [id number]   cc:   Wyvonnia Lora  [address]  [place]  [place] [address]  Fax: [phone]

## 2011-03-26 NOTE — Assessment & Plan Note (Signed)
[facility]                         GASTROENTEROLOGY OFFICE NOTE   JEBADIAH, IMPERATO                       MRN:          [id number]  DATE:03/03/2007                            DOB:          1941/09/20    Ms. Wilkie is a 70 year old, retired, white male brought in today by his  daughter for evaluation of constipation with rather severe abdominal gas  and bloating.   Mr. Bogus has a somewhat involved past history which seems to revolve  around ulcerative colitis for many years treated by Dr. Allena Katz in  [place] and [place] in [place]. I do have an operative report of  September 03, 2003 from Dr. Karilyn Cota where the patient was admitted with  ulcerative colitis requiring corticosteroid therapy. At that time, he  had CT scan of his abdomen that showed a thickened colon which was  confirmed by colonoscopy but the patient and his daughter deny that he  actually inflammatory bowel disease, and he certainly is on no therapy  for such. In fact, his current complaint is one of increasing  constipation to the point that he saw Dr. Norval Gable and was placed on  Amitiza 24 mcg twice a day and is now only taking it once a day with  good results. His bowels are moving well but has rather severe abdominal  gas and bloating. He denies melena or hematochezia. He does have adult  onset diabetes mellitus and hypercholesterolemia and obesity. His  daughter relates that he follows a regular diet and has no specific food  intolerances but he does use a lot of Glucerna. He has had no real  anorexia, weight loss, fever, chills, skin rashes, joint pains, oral  stomatitis, or visual difficulties. He has not had barium studies or  colonoscopy in many years. He has a vague history of having had possibly  some colon polyps removed in [place]. He denies any history of  hepatitis, pancreatitis, but relates that he did have an episode of  gallstones many years ago and almost was  operated on.   PAST MEDICAL HISTORY:  Remarkable for hypertensive cardiovascular  disease with previous MI's and he has had angioplasty and stent  placement. He is not anticoagulated but does take an aspirin tablet. In  the past, he has had problems with cardiac arrhythmia and congestive  heart failure. He says he is doing well at this time. He has adult onset  diabetes, hypercholesterolemia, degenerative arthritis and chronic  anxiety and depression. He has had multiple orthopedic procedures on his  knees and ankles.   MEDICATIONS:  1. Lipitor 20 mg a day.  2. Tricor 48 mg a day.  3. Avandaryl 4 mg 1 a day.  4. Niaspan 500 mg a day.  5. Aspirin 81 mg a day.  6. Amitiza 24 mcg daily.  7. Multivitamins.  8. Colon cleanser daily.  9. Acidophilus capsules daily.  10.Digestive enzyme tablets daily.   He denies drug allergies.   FAMILY HISTORY:  Noncontributory.   SOCIAL HISTORY:  The patient is married but lives by himself. He has a  high school education. He denies  abuse of alcohol or cigarettes. In  fact, has not drunk in the last 20 years.   REVIEW OF SYSTEMS:  Remarkable for mild deafness, chronic low back pain,  arthralgias in his lower extremities and chronic insomnia.   LABORATORY DATA:  Recent lab data on February 09, 2007 showed normal SGOT,  SGPT, lipid panel, hemoglobin A1c was 5.8%, and PSA was 1.8 nanograms  percent.   PHYSICAL EXAMINATION:  GENERAL:  He is a healthy-appearing, white male,  appearing his stated age in no acute distress. He is 5 feet 11 inches  tall and weighs 255 pounds.  VITAL SIGNS:  Blood pressure 112/60 and pulse was 76 and regular.  HEENT:  I could not appreciate stigmata of chronic liver disease.  CHEST:  His chest was clear.  CARDIAC:  He was in a regular rhythm without murmur, gallop or rub.  ABDOMEN:  I could not appreciate any definite hepatosplenomegaly,  abdominal masses or localized tenderness. Bowel sounds were present.  EXTREMITIES:   Peripheral extremities were unremarkable without edema,  phlebitis, or swollen joint.  NEUROLOGIC:  His mental status was clear.  RECTUM:  Inspection of the rectum was unremarkable as was rectal exam.  There was liquidy stool present that was trace to +1 guaiac positive.   ASSESSMENT:  1. Rather marked abdominal gas and bloating perhaps related to      malabsorption of nondigestible carbohydrates - rule out chronic      inflammatory bowel disease with element of bacterial overgrowth      syndrome. There is certainly no reason in his history to suggest      chronic malabsorption syndrome.  2. Guaiac positive stools related to #1.  3. Adult onset diabetes mellitus.  4. Hypertensive cardiovascular disease with hyperlipidemia and      previous myocardial infarctions and coronary artery stenting.  5. Degenerative joint disease.  6. History of chronic anxiety and depression.  7. History of benign prostatic hypertrophy.   RECOMMENDATIONS:  1. Continue all medications as listed above per primary care      physician.  2. Outpatient colonoscopy at his convenience.  3. Check CBC, sed rate, iron levels, sprue panel, amylase, lipase, and      serum carotene level.  4. Consider further workup and therapy depending on lab results and      colonoscopy exam.   I forgot to mention above the patient is on chronic probiotic therapy.     Vania Rea. Jarold Motto, MD, Caleen Essex, FAGA  Electronically Signed    DRP/MedQ  DD: 03/03/2007  DT: 03/03/2007  Job #: [id number]   cc:   Donnetta Hutching, MD

## 2012-04-19 ENCOUNTER — Encounter (INDEPENDENT_AMBULATORY_CARE_PROVIDER_SITE_OTHER): Payer: Self-pay | Admitting: Internal Medicine

## 2012-04-19 ENCOUNTER — Ambulatory Visit (INDEPENDENT_AMBULATORY_CARE_PROVIDER_SITE_OTHER): Payer: Medicare PPO | Admitting: Internal Medicine

## 2012-04-19 VITALS — BP 120/84 | HR 72 | Temp 97.8°F | Ht 72.0 in | Wt 254.1 lb

## 2012-04-19 DIAGNOSIS — K5120 Ulcerative (chronic) proctitis without complications: Secondary | ICD-10-CM

## 2012-04-19 DIAGNOSIS — K625 Hemorrhage of anus and rectum: Secondary | ICD-10-CM

## 2012-04-19 NOTE — Progress Notes (Signed)
Subjective:     Patient ID: Ronald Mayo, male   DOB: [birth year], 71 y.o.   MRN: [id number]  HPI Ronald Mayo is a 71 yr old male presenting today with c/o that he has had bloody diarrhea x 3 weeks. He is having anywhere from 15-20 stools a day.  He is presently not taking any medication for his UC.  His last colonoscopy was in 2011 which was incomplete; Active colitis involving rectum and sigmoid colon.  Exam could not be completed because of sigmoid stricture with pseudopolyp formation.  He has tried [employer] but this has not helped.   Review of Systems see hpi No current outpatient prescriptions on file.   Past Medical History  Diagnosis Date  . 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 Diabetes mellitus   . CAD (coronary artery disease)    Past Surgical History  Procedure Date  . Cardiac stents    Family Status  Relation Status Death Age  . Mother Deceased   . Father Deceased   . Sister Alive     good health  . Brother Alive     good health.   History   Social History  . Marital Status: Single    Spouse Name: N/A    Number of Children: N/A  . Years of Education: N/A   Occupational History  . Not on file.   Social History Main Topics  . Smoking status: Never Smoker   . Smokeless tobacco: Not on file  . Alcohol Use: No  . Drug Use: No  . Sexually Active: Not on file   Other Topics Concern  . Not on file   Social History Narrative  . No narrative on file   Allergies not on file No Known Allergies      Objective:   Physical Exam Filed Vitals:   04/19/12 1454  Height: 6' (1.829 m)  Weight: 254 lb 1.6 oz (115.259 kg)  Alert and oriented. Skin warm and dry. Oral mucosa is moist.   . Sclera anicteric, conjunctivae is pink. Thyroid not enlarged. No cervical lymphadenopathy. Lungs clear. Heart regular rate and rhythm.  Abdomen is soft. Bowel sounds are positive. No hepatomegaly. No abdominal masses felt. No tenderness.Stool is brown and guaiac positive   No edema to lower extremities.       Assessment:     Active UC., non-compliant. Grossly positive today in office.  I discussed this case with Dr. Karilyn Cota.     Plan:     Prednisone 30mg  x 1 week then 20mg  till OV in 3 weeks. Asacol HD 800mg  2 tabs twice a day. Samples of 8 boxes given to patient. CBC today.

## 2012-04-19 NOTE — Progress Notes (Signed)
Subjective:     Patient ID: Ronald Mayo, male   DOB: 1941/03/05, 71 y.o.   MRN: [id number]  HPIPatient here today for f/u of his UC. He has not been seen in about 2 years.  He says he is having rectal bleeding c 3 weeks.. He has bright red rectal bleeding with bowel movements. He is having 15-20 stools a day. He tried [employer] which did not help.  Stools are very watery. Diagnosed over 10 yrs ago.   Colonoscopy attempted in 2011 which revealed active colitis involving rectum and sigmoid colon. Exam not complete because  Of sigmoid colon stricture with pseudopolyp formation.    Review of Systems see hpi No current outpatient prescriptions on file.   Past Medical History  Diagnosis Date  . 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 Diabetes mellitus   . CAD (coronary artery disease)    Past Surgical History  Procedure Date  . Cardiac stents    Family Status  Relation Status Death Age  . Mother Deceased   . Father Deceased   . Sister Alive     good health  . Brother Alive     good health.   History   Social History  . Marital Status: Single    Spouse Name: N/A    Number of Children: N/A  . Years of Education: N/A   Occupational History  . Not on file.   Social History Main Topics  . Smoking status: Never Smoker   . Smokeless tobacco: Not on file  . Alcohol Use: No  . Drug Use: No  . Sexually Active: Not on file   Other Topics Concern  . Not on file   Social History Narrative  . No narrative on file   Allergies not on file      Objective:   Physical Exam Filed Vitals:   04/19/12 1454  Height: 6' (1.829 m)  Weight: 254 lb 1.6 oz (115.259 kg)  Alert and oriented. Skin warm and dry. Oral mucosa is moist.   . Sclera anicteric, conjunctivae is pink. Thyroid not enlarged. No cervical lymphadenopathy. Lungs clear. Heart regular rate and rhythm.  Abdomen is soft. Bowel sounds are positive. No hepatomegaly. No abdominal masses felt. No tenderness. Stool  brown and guaiac positive  No edema to lower extremities.  .       Assessment:    Active UC, non compliance with medication    Plan:    cbc

## 2012-04-19 NOTE — Patient Instructions (Addendum)
CBC today. Asacol 800mg  2 tabs twice a day. Prednisone 30mg  x 1 week then 20mg  till OV.

## 2012-04-20 LAB — CBC WITH DIFFERENTIAL/PLATELET
Basophils Absolute: 0.1 10*3/uL (ref 0.0–0.1)
Eosinophils Relative: 6 % — ABNORMAL HIGH (ref 0–5)
Lymphocytes Relative: 26 % (ref 12–46)
Neutro Abs: 4.7 10*3/uL (ref 1.7–7.7)
Neutrophils Relative %: 54 % (ref 43–77)
Platelets: 300 10*3/uL (ref 150–400)
RDW: 13.2 % (ref 11.5–15.5)
WBC: 8.7 10*3/uL (ref 4.0–10.5)

## 2012-04-25 ENCOUNTER — Encounter (INDEPENDENT_AMBULATORY_CARE_PROVIDER_SITE_OTHER): Payer: Self-pay

## 2012-05-01 ENCOUNTER — Encounter: Payer: Self-pay | Admitting: Cardiology

## 2012-05-09 ENCOUNTER — Encounter (INDEPENDENT_AMBULATORY_CARE_PROVIDER_SITE_OTHER): Payer: Self-pay | Admitting: Internal Medicine

## 2012-05-09 ENCOUNTER — Ambulatory Visit (INDEPENDENT_AMBULATORY_CARE_PROVIDER_SITE_OTHER): Payer: Medicare PPO | Admitting: Internal Medicine

## 2012-05-09 VITALS — BP 120/62 | HR 64 | Temp 97.6°F | Ht 71.0 in | Wt 258.9 lb

## 2012-05-09 DIAGNOSIS — K5120 Ulcerative (chronic) proctitis without complications: Secondary | ICD-10-CM

## 2012-05-09 MED ORDER — MESALAMINE 800 MG PO TBEC: 1600.0000 mg | DELAYED_RELEASE_TABLET | Freq: Two times a day (BID) | ORAL | Status: DC

## 2012-05-09 NOTE — Progress Notes (Signed)
Subjective:     Patient ID: Ronald Mayo, male   DOB: 09/13/1941, 71 y.o.   MRN: [id number]  HPIWilliam is here today for f/u of his UC. He says he feels better. He is having 3-4  Stools a day.  He denies seeing any blood. Appetite is good. No weight loss. No abdominal pain.  He was diagnosed over 10 yrs ago with UC.   Colonoscopy attempted in 2011 which revealed active colitis involving rectum and sigmoid colon. Exam not complete because Of sigmoid colon stricture with pseudopolyp formation.    CBC    Component Value Date/Time   WBC 8.7 04/19/2012 1550   RBC 4.76 04/19/2012 1550   HGB 15.1 04/19/2012 1550   HCT 45.2 04/19/2012 1550   PLT 300 04/19/2012 1550   MCV 95.0 04/19/2012 1550   MCH 31.7 04/19/2012 1550   MCHC 33.4 04/19/2012 1550   RDW 13.2 04/19/2012 1550   LYMPHSABS 2.3 04/19/2012 1550   MONOABS 1.1* 04/19/2012 1550   EOSABS 0.5 04/19/2012 1550   BASOSABS 0.1 04/19/2012 1550       Review of Systems see hpi Current Outpatient Prescriptions  Medication Sig Dispense Refill  . amLODipine (NORVASC) 5 MG tablet Take 5 mg by mouth daily.      Marland Kitchen aspirin 81 MG tablet Take 81 mg by mouth daily.      . diazepam (VALIUM) 10 MG tablet Take 10 mg by mouth every 6 (six) hours as needed. Take one tablet by mouth 30 minutes prior to HBO      . diphenhydrAMINE (SOMINEX) 25 MG tablet Take 25 mg by mouth as needed.      . fenofibrate (TRICOR) 145 MG tablet Take 145 mg by mouth daily.       Marland Kitchen glimepiride (AMARYL) 4 MG tablet Take 4 mg by mouth daily before breakfast.       . HYDROcodone-acetaminophen (LORTAB) 10-500 MG per tablet Take 1 tablet by mouth every 6 (six) hours as needed.      . mesalamine (APRISO) 0.375 G 24 hr capsule Take 375 mg by mouth 4 (four) times daily.      . metformin (FORTAMET) 1000 MG (OSM) 24 hr tablet 1,000 mg 2 (two) times daily with a meal.       . nitroGLYCERIN (NITROSTAT) 0.4 MG SL tablet Place 0.4 mg under the tongue every 5 (five) minutes as needed.      .  pantoprazole (PROTONIX) 40 MG tablet Take 40 mg by mouth daily.       . simvastatin (ZOCOR) 20 MG tablet Take 20 mg by mouth every evening.        Past Surgical History  Procedure Date  . Cardiac stents   . Leg surgeries     multiple  . Traumas, umbilical hernia repair    No Known Allergies Family Status  Relation Status Death Age  . Mother Deceased   . Father Deceased   . Sister Alive     good health  . Brother Alive     good health.   History   Social History  . Marital Status: Married    Spouse Name: N/A    Number of Children: 2  . Years of Education: N/A   Occupational History  . Not on file.   Social History Main Topics  . Smoking status: Never Smoker   . Smokeless tobacco: Not on file  . Alcohol Use: No  . Drug Use: No  . Sexually Active: Not  on file   Other Topics Concern  . Not on file   Social History Narrative  . No narrative on file   No Known Allergies     Objective:   Physical Exam   Filed Vitals:   05/09/12 1120  Height: 5\' 11"  (1.803 m)  Weight: 258 lb 14.4 oz (117.436 kg)   Alert and oriented. Skin warm and dry. Oral mucosa is moist.   . Sclera anicteric, conjunctivae is pink. Thyroid not enlarged. No cervical lymphadenopathy. Lungs clear. Heart regular rate and rhythm.  Abdomen is soft. Bowel sounds are positive. No hepatomegaly. No abdominal masses felt. No tenderness.  No edema to lower extremities.       Assessment:   UC flare resolving   He feels much better at th is time.    Plan:    Prednisone 15mg  x 1 week, 10mg  x 1 week, 5mg  x 1 week then stop.  Continue Asacol 800mg  two tabs twice a day.  Samples given to patient.   OV in 3 months.

## 2012-05-09 NOTE — Patient Instructions (Signed)
Asacol 800mg  2 tabs twice a day. Prednisone 15mg  x 1 week, then 10mg  x 1 week, then 5mg  x 1 week. Rx for Asacol 800mg  two tabs BID eprescribed. OV in 3 months.

## 2012-05-10 ENCOUNTER — Ambulatory Visit (INDEPENDENT_AMBULATORY_CARE_PROVIDER_SITE_OTHER): Payer: Medicare PPO | Admitting: Internal Medicine

## 2012-08-09 ENCOUNTER — Encounter (INDEPENDENT_AMBULATORY_CARE_PROVIDER_SITE_OTHER): Payer: Self-pay | Admitting: Internal Medicine

## 2012-08-09 ENCOUNTER — Ambulatory Visit (INDEPENDENT_AMBULATORY_CARE_PROVIDER_SITE_OTHER): Payer: Medicare PPO | Admitting: Internal Medicine

## 2012-08-09 VITALS — BP 104/66 | HR 80 | Temp 97.6°F | Ht 72.0 in | Wt 262.0 lb

## 2012-08-09 DIAGNOSIS — K5120 Ulcerative (chronic) proctitis without complications: Secondary | ICD-10-CM

## 2012-08-09 NOTE — Progress Notes (Signed)
Subjective:     Patient ID: Ronald Mayo, male   DOB: 1941/10/21, 71 y.o.   MRN: [id number]  HPI Presents today for f/u of his UC., Occasionally has abdominal pain. Thinks it may be related to being constipated. He like the Asacol 800mg  HD.  He says he feels good. He is having 3 stools. Stools are formed.  No melena or bright red rectal bleeding. No mucous. Diagnosed with UC over 10 yrs ago. Colonoscopy attempted in 2011 which revealed active colitis involving rectum and sigmoid colon. Exam not complete because Of sigmoid colon stricture with pseudopolyp formation.  He occasionally has acid reflux but not on a daily basis.  Appetite is good. No weight loss.  He tells me he craves sweets.    Component Value Date/Time   WBC 8.7 04/19/2012 1550   RBC 4.76 04/19/2012 1550   HGB 15.1 04/19/2012 1550   HCT 45.2 04/19/2012 1550   PLT 300 04/19/2012 1550   MCV 95.0 04/19/2012 1550   MCH 31.7 04/19/2012 1550   MCHC 33.4 04/19/2012 1550   RDW 13.2 04/19/2012 1550   LYMPHSABS 2.3 04/19/2012 1550   MONOABS 1.1* 04/19/2012 1550   EOSABS 0.5 04/19/2012 1550   BASOSABS 0.1 04/19/2012 1550      Review of Systems see hpi Current Outpatient Prescriptions  Medication Sig Dispense Refill  . aspirin 81 MG tablet Take 81 mg by mouth daily.      . fenofibrate (TRICOR) 145 MG tablet Take 145 mg by mouth daily.       Marland Kitchen glimepiride (AMARYL) 4 MG tablet Take 4 mg by mouth daily before breakfast.       . Mesalamine (ASACOL HD) 800 MG TBEC Take 2 tablets (1,600 mg total) by mouth 2 (two) times daily before a meal.  120 tablet  3  . DISCONTD: mesalamine (APRISO) 0.375 G 24 hr capsule Take 375 mg by mouth 4 (four) times daily.       Past Medical History  Diagnosis Date  . 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 Diabetes mellitus   . CAD (coronary artery disease)   . Personal history of other malignant neoplasm of skin   . Atrial fibrillation    History   Social History  . Marital Status: Married   Spouse Name: N/A    Number of Children: 2  . Years of Education: N/A   Occupational History  . Not on file.   Social History Main Topics  . Smoking status: Never Smoker   . Smokeless tobacco: Not on file  . Alcohol Use: No  . Drug Use: No  . Sexually Active: Not on file   Other Topics Concern  . Not on file   Social History Narrative  . No narrative on file   No Known Allergies Family Status  Relation Status Death Age  . Mother Deceased   . Father Deceased   . Sister Alive     good health  . Brother Alive     good health.        Objective:   Physical Exam Filed Vitals:   08/09/12 1051  BP: 104/66  Pulse: 80  Temp: 97.6 F (36.4 C)  Height: 6' (1.829 m)  Weight: 262 lb (118.842 kg)   Alert and oriented. Skin warm and dry. Oral mucosa is moist.   . Sclera anicteric, conjunctivae is pink. Thyroid not enlarged. No cervical lymphadenopathy. Lungs clear. Heart regular rate and rhythm.  Abdomen is soft. Bowel  sounds are positive. No hepatomegaly. No abdominal masses felt. No tenderness.  No edema to lower extremities.       Assessment:    UC in remission. Patient is doing good. No GI symptoms. Occasionally has acid reflux.    Plan:    OV 6 months with Dr. Karilyn Cota. Continue the Asacol . Advised to continue Protonix for his acid reflux.

## 2012-08-09 NOTE — Patient Instructions (Addendum)
OV in 6 months. Continue present medications 

## 2012-11-04 ENCOUNTER — Other Ambulatory Visit (INDEPENDENT_AMBULATORY_CARE_PROVIDER_SITE_OTHER): Payer: Self-pay | Admitting: Internal Medicine

## 2013-02-06 ENCOUNTER — Encounter (INDEPENDENT_AMBULATORY_CARE_PROVIDER_SITE_OTHER): Payer: Self-pay | Admitting: Internal Medicine

## 2013-02-06 ENCOUNTER — Ambulatory Visit (INDEPENDENT_AMBULATORY_CARE_PROVIDER_SITE_OTHER): Payer: Medicare PPO | Admitting: Internal Medicine

## 2013-02-06 VITALS — BP 130/80 | HR 82 | Temp 97.3°F | Resp 20 | Ht 72.0 in | Wt 267.7 lb

## 2013-02-06 DIAGNOSIS — K5190 Ulcerative colitis, unspecified, without complications: Secondary | ICD-10-CM

## 2013-02-06 NOTE — Progress Notes (Addendum)
Presenting complaint;  Followup for ulcerative colitis.  Subjective:  Patient is 72 year old Caucasian male who has over 10 year history of ulcerative colitis was therefore scheduled visit. He was last seen by Ms. Dorene Ar NP 6 months ago. He rarely misses mesalamine dose. He feels well. He denies abdominal pain rectal bleeding or diarrhea. He generally has 2-3 BMs per day. His appetite is very good. He has gained 7 pounds since his last visit. He states he's been having left leg pain with the last few weeks and not able to walk or exercise. He is planning to see his orthopedic surgeon. He's had surgery on his left leg and has muscle wasting. He is also having difficulty getting his prescription filled and is getting samples. If possible he does not want to be switched over to another preparation as it is working very well. He states he had blood work about 2 months ago by his PCP. He did take Apriso last year but it did not work.  Current Medications: Current Outpatient Prescriptions  Medication Sig Dispense Refill  . ASACOL HD 800 MG TBEC TAKE 2 TABLETS TWICE A DAY BEFORE A MEAL  120 tablet  5  . aspirin 81 MG tablet Take 81 mg by mouth daily.      Marland Kitchen buPROPion (WELLBUTRIN) 75 MG tablet Take 75 mg by mouth daily.      . fenofibrate (TRICOR) 145 MG tablet Take 145 mg by mouth daily.       Marland Kitchen glimepiride (AMARYL) 4 MG tablet Take 4 mg by mouth daily before breakfast.        No current facility-administered medications for this visit.     Objective: Blood pressure 130/80, pulse 82, temperature 97.3 F (36.3 C), temperature source Oral, resp. rate 20, height 6' (1.829 m), weight 267 lb 11.2 oz (121.428 kg). Patient is alert and in no acute distress. He has severe hearing impairment. Conjunctiva is pink. Sclera is nonicteric Oropharyngeal mucosa is normal. No neck masses or thyromegaly noted. Cardiac exam with regular rhythm normal S1 and S2. No murmur or gallop noted. Lungs are clear  to auscultation. Abdomen is full but soft and nontender without organomegaly or masses.  No LE edema or clubbing noted. He has muscle wasting to left calf and peroneal muscles.   Assessment:  #1. Ulcerative colitis appears to be in remission. Disease duration more than 10 years. Last colonoscopy was in March 2011 and was incomplete secondary to sigmoid colon stricture which was not dilated due to active disease. He is due for surveillance colonoscopy. He would like to wait until fall.    Plan:  Continue Asacol HD at 1.6 g by mouth twice a day. Samples given. If this medication is not approved by his insurance will switch him to another mesalamine which is on his plan. We'll request copy of his blood work from PCP. Office visit in 6 months.

## 2013-02-06 NOTE — Patient Instructions (Signed)
Can take Tylenol up to 2 g per day as needed. Remember you cannot take OTC NSAIDs(arthritis medications such as Advil and Aleve) but can stay on low-dose aspirin. Will request copy of recent blood work from your primary care physician for review.

## 2013-03-14 ENCOUNTER — Encounter (INDEPENDENT_AMBULATORY_CARE_PROVIDER_SITE_OTHER): Payer: Self-pay

## 2013-03-14 ENCOUNTER — Telehealth (INDEPENDENT_AMBULATORY_CARE_PROVIDER_SITE_OTHER): Payer: Self-pay | Admitting: *Deleted

## 2013-03-14 DIAGNOSIS — K5190 Ulcerative colitis, unspecified, without complications: Secondary | ICD-10-CM

## 2013-03-14 NOTE — Telephone Encounter (Signed)
Per Dr.Rehman the patient will need to have labs drawn. 

## 2013-03-20 LAB — CBC WITH DIFFERENTIAL/PLATELET
Basophils Relative: 1 % (ref 0–1)
Hemoglobin: 14.9 g/dL (ref 13.0–17.0)
MCHC: 34.4 g/dL (ref 30.0–36.0)
Monocytes Relative: 8 % (ref 3–12)
Neutro Abs: 4.5 10*3/uL (ref 1.7–7.7)
Neutrophils Relative %: 59 % (ref 43–77)
RBC: 4.7 MIL/uL (ref 4.22–5.81)
WBC: 7.6 10*3/uL (ref 4.0–10.5)

## 2013-03-29 ENCOUNTER — Telehealth (INDEPENDENT_AMBULATORY_CARE_PROVIDER_SITE_OTHER): Payer: Self-pay | Admitting: *Deleted

## 2013-03-29 NOTE — Telephone Encounter (Signed)
Per Dr.Rehman we may call in Balsalazide 2.25 grams take 1 by mouth three times a day 1 month with 5 refills This was called to the [facility] in [place] and was left on Pharmacist Voicemail. I ask that she call me back with any questions.

## 2013-05-07 ENCOUNTER — Encounter (INDEPENDENT_AMBULATORY_CARE_PROVIDER_SITE_OTHER): Payer: Self-pay

## 2013-08-07 ENCOUNTER — Ambulatory Visit (INDEPENDENT_AMBULATORY_CARE_PROVIDER_SITE_OTHER): Payer: Medicare PPO | Admitting: Internal Medicine

## 2013-08-07 ENCOUNTER — Encounter (INDEPENDENT_AMBULATORY_CARE_PROVIDER_SITE_OTHER): Payer: Self-pay | Admitting: Internal Medicine

## 2013-08-07 VITALS — BP 130/70 | HR 80 | Temp 97.3°F | Resp 20 | Ht 72.0 in | Wt 255.3 lb

## 2013-08-07 DIAGNOSIS — F329 Major depressive disorder, single episode, unspecified: Secondary | ICD-10-CM | POA: Insufficient documentation

## 2013-08-07 DIAGNOSIS — F32A Depression, unspecified: Secondary | ICD-10-CM | POA: Insufficient documentation

## 2013-08-07 DIAGNOSIS — K5190 Ulcerative colitis, unspecified, without complications: Secondary | ICD-10-CM

## 2013-08-07 NOTE — Patient Instructions (Addendum)
Please remember to take Asacol HD every day. Please call your primary care physician's office to make an appointment regarding depression.

## 2013-08-07 NOTE — Progress Notes (Signed)
Presenting complaint;  Followup for ulcerative colitis.  Subjective:  Patient is 72 year old Caucasian male has chronic ulcerative colitis and presents for scheduled visit. He was last seen in April 2014. He denies rectal bleeding or abdominal pain. He experiences diarrhea intermittently. He had an episode yesterday and this morning. These spells of diarrhea are not associated with nausea vomiting fever or chills. He does not remember the last time he saw blood with his bowel movements. He ran out of mesalamine samples 2 weeks ago. He states the scope a 6 her daughters and she cannot fold. He is getting samples from the office. He is not having any side effects with this medication. He has lost 12 pounds since his last visit. He states he is cutback on his bread. He feels depressed. He states that Wellbutrin dose was doubled but it's not helping.  Current Medications: Current Outpatient Prescriptions  Medication Sig Dispense Refill  . aspirin 81 MG tablet Take 81 mg by mouth daily.      Marland Kitchen buPROPion (WELLBUTRIN) 75 MG tablet Take 300 mg by mouth daily.       . fenofibrate (TRICOR) 145 MG tablet Take 145 mg by mouth daily.       Marland Kitchen glimepiride (AMARYL) 4 MG tablet Take 4 mg by mouth daily before breakfast.       . Multiple Vitamin (MULTI VITAMIN DAILY PO) Take by mouth as needed.      . ASACOL HD 800 MG TBEC TAKE 2 TABLETS TWICE A DAY BEFORE A MEAL  120 tablet  5   No current facility-administered medications for this visit.     Objective: Blood pressure 130/70, pulse 80, temperature 97.3 F (36.3 C), temperature source Oral, resp. rate 20, height 6' (1.829 m), weight 255 lb 4.8 oz (115.803 kg). Patient is alert and in no acute distress. Conjunctiva is pink. Sclera is nonicteric Oropharyngeal mucosa is normal. No neck masses or thyromegaly noted. Cardiac exam with irregular rhythm normal S1 and S2. No murmur or gallop noted. Lungs are clear to auscultation. Abdomen is full but soft and  nontender without organomegaly or masses. No LE edema or clubbing noted.  Labs/studies Results: CBC from 03/20/2013 WBC 7.6, H&H 14.9 and 43.3 and platelet count 299K.   Assessment:  #1. Chronic ulcerative colitis. Disease duration 13 years. Presently he appears to be in remission. He needs to take oral mesalamine regularly so that he would not end up with relapse. Patient is also due for surveillance colonoscopy.     Plan:  Continue Asacol HD at 1.6 g by mouth twice a day. Samples given. Will need to contact his insurance to find why this medication carries such a high co-pay. Patient advised to make an appointment with PCP as soon as possible regarding management of his depression. Colonoscopy will be scheduled later this year or next. Office visit in 6 months.

## 2013-10-17 ENCOUNTER — Telehealth (INDEPENDENT_AMBULATORY_CARE_PROVIDER_SITE_OTHER): Payer: Self-pay | Admitting: *Deleted

## 2013-10-17 NOTE — Telephone Encounter (Signed)
We have gone through this, a PA was done a few months back. Asacol is not on the patient's formulary. Of the options given Dr.Rehman ordered  The following: Balsalazide 2.25 grams patient is to take 1 by mouth three times daily,1 month and 5 refills. [facility]/ left on the Pharmacist voicemail. Will wait to hear back form Mrs. Thurmond Butts, we currently do not have samples of the Asacol.

## 2013-10-17 NOTE — Telephone Encounter (Signed)
Ronald Mayo has not heard anything back from our office about weather or not his insurance has approved the Asacol. He is out of this medicine and would also like to know if we have any samples. Per [name], the authorization was to be done in Nov. Advised [name] to call his pharmacy and see if they have the approval and medicine on hold. Still would like samples. There return phone number is [phone].

## 2014-02-04 ENCOUNTER — Ambulatory Visit (INDEPENDENT_AMBULATORY_CARE_PROVIDER_SITE_OTHER): Payer: Medicare PPO | Admitting: Internal Medicine

## 2014-02-04 ENCOUNTER — Encounter (INDEPENDENT_AMBULATORY_CARE_PROVIDER_SITE_OTHER): Payer: Self-pay | Admitting: Internal Medicine

## 2014-02-04 VITALS — BP 106/70 | HR 78 | Temp 97.2°F | Resp 20 | Ht 72.0 in | Wt 251.3 lb

## 2014-02-04 DIAGNOSIS — K219 Gastro-esophageal reflux disease without esophagitis: Secondary | ICD-10-CM

## 2014-02-04 DIAGNOSIS — K5190 Ulcerative colitis, unspecified, without complications: Secondary | ICD-10-CM

## 2014-02-04 LAB — CBC
HCT: 42.5 % (ref 39.0–52.0)
HEMOGLOBIN: 14.6 g/dL (ref 13.0–17.0)
MCH: 32 pg (ref 26.0–34.0)
MCHC: 34.4 g/dL (ref 30.0–36.0)
MCV: 93.2 fL (ref 78.0–100.0)
Platelets: 344 10*3/uL (ref 150–400)
RBC: 4.56 MIL/uL (ref 4.22–5.81)
RDW: 13.6 % (ref 11.5–15.5)
WBC: 8.5 10*3/uL (ref 4.0–10.5)

## 2014-02-04 MED ORDER — PANTOPRAZOLE SODIUM 40 MG PO TBEC: 40.0000 mg | DELAYED_RELEASE_TABLET | Freq: Every day | ORAL | Status: DC

## 2014-02-04 NOTE — Progress Notes (Signed)
Presenting complaint;  Followup for ulcerative colitis. Patient complains of diarrhea and rectal bleeding.  Subjective:  Patient has chronic ulcerative colitis and was last seen in September 2014. He is presently not taking oral mesalamine. He has tried all preparations and he believes he gets best results with Asacol HD. However his insurance would not cover this medication. When he ran out of samples he tried balsalazide stopped it after a few doses because it made his diarrhea worse. He is now back with diarrhea. He is having as many as 8 stools per day. He's had 3 episodes of rectal bleeding in 6 weeks. He denies abdominal pain nausea vomiting fever or chills. He has very good appetite. His weight is down by 4 pounds. He recently developed cellulitis of left foot and was treated with Cipro followed by Bactrim.  Current Medications: Outpatient Encounter Prescriptions as of 02/04/2014  Medication Sig  . aspirin 81 MG tablet Take 81 mg by mouth daily.  . citalopram (CELEXA) 20 MG tablet Take 20 mg by mouth daily.   . fenofibrate (TRICOR) 145 MG tablet Take 145 mg by mouth daily.   Marland Kitchen. glimepiride (AMARYL) 4 MG tablet Take 4 mg by mouth daily before breakfast.   . Multiple Vitamin (MULTI VITAMIN DAILY PO) Take by mouth as needed.  . traMADol (ULTRAM) 50 MG tablet Take 50 mg by mouth as needed for moderate pain.   . ASACOL HD 800 MG TBEC TAKE 2 TABLETS TWICE A DAY BEFORE A MEAL  . [DISCONTINUED] buPROPion (WELLBUTRIN) 75 MG tablet Take 300 mg by mouth daily.     Objective: Blood pressure 106/70, pulse 78, temperature 97.2 F (36.2 C), temperature source Oral, resp. rate 20, height 6' (1.829 m), weight 251 lb 4.8 oz (113.989 kg). Patient is alert and in no acute distress. He has severe hearing impairment. Conjunctiva is pink. Sclera is nonicteric Oropharyngeal mucosa is normal. No neck masses or thyromegaly noted. Cardiac exam with regular rhythm normal S1 and S2. No murmur or gallop  noted. Lungs are clear to auscultation. Abdomen is full but soft and nontender without organomegaly or masses.  No LE edema or clubbing noted.    Assessment:  #1. Ulcerative colitis. Symptoms of relapse off therapy. He has been intolerant of oral mesalamine preparations except Asacol HD which is not preferred medication on his insurance plan. He was recently treated with Cipro and Bactrim for left foot cellulitis. Therefore C. difficile colitis also needs to be ruled out. However he does not appear to be toxic.   Plan:  Patient will go back on Asacol HD 800 mg by mouth twice a day. Samples given and will try to get authorization for this medication. Patient will go to lab for CBC and C. difficile by PCR. Office visit in 3 months.

## 2014-02-04 NOTE — Patient Instructions (Addendum)
Physician will call with results of blood work and stool test. We will call your insurance company again to see if they would approve Asacol HD

## 2014-02-13 LAB — CLOSTRIDIUM DIFFICILE BY PCR: Toxigenic C. Difficile by PCR: NOT DETECTED

## 2014-04-02 ENCOUNTER — Telehealth (INDEPENDENT_AMBULATORY_CARE_PROVIDER_SITE_OTHER): Payer: Self-pay | Admitting: *Deleted

## 2014-04-02 NOTE — Telephone Encounter (Signed)
Would like to see if he could get Asacole samples. The return phone number is [phone].

## 2014-04-04 NOTE — Telephone Encounter (Signed)
Patient was called. Samples of Asacol HD @ front desk for the patient per NUR.

## 2014-05-13 ENCOUNTER — Ambulatory Visit (INDEPENDENT_AMBULATORY_CARE_PROVIDER_SITE_OTHER): Payer: Medicare PPO | Admitting: Internal Medicine

## 2014-05-13 ENCOUNTER — Encounter (INDEPENDENT_AMBULATORY_CARE_PROVIDER_SITE_OTHER): Payer: Self-pay | Admitting: Internal Medicine

## 2014-05-13 VITALS — BP 126/76 | HR 72 | Temp 98.0°F | Resp 18 | Ht 72.0 in | Wt 248.7 lb

## 2014-05-13 DIAGNOSIS — K5190 Ulcerative colitis, unspecified, without complications: Secondary | ICD-10-CM

## 2014-05-13 DIAGNOSIS — K51911 Ulcerative colitis, unspecified with rectal bleeding: Secondary | ICD-10-CM

## 2014-05-13 MED ORDER — MESALAMINE 800 MG PO TBEC: 1600.0000 mg | DELAYED_RELEASE_TABLET | Freq: Three times a day (TID) | ORAL | Status: DC

## 2014-05-13 NOTE — Progress Notes (Signed)
Presenting complaint;  Followup for ulcerative colitis.  Subjective:  Patient is 73 year old Caucasian male with chronic ulcerative colitis who presents for scheduled visit. He is still having diarrhea with 3-4 stools per day. Some of his stools are formed but most are loose. At times he sees some blood which he believes is secondary to hemorrhoids. He has sporadic nausea with sweating usually with pain on the left side. He has not experienced vomiting spells. He denies right upper quadrant pain. He has occasional nocturnal diarrhea. He feels Peanuts and walnuts as well as calm it makes his diarrhea worse. He is trying lactose free milk as well as almond milk. He is not having any side effects with oral mesalamine. He states he does not eat healthy foods. On account of bad arthritis is not able to do much. He is taking pantoprazole an as-needed basis.    Current Medications: Outpatient Encounter Prescriptions as of 05/13/2014  Medication Sig  . ASACOL HD 800 MG TBEC TAKE 2 TABLETS TWICE A DAY BEFORE A MEAL  . aspirin 81 MG tablet Take 81 mg by mouth daily.  . citalopram (CELEXA) 20 MG tablet Take 20 mg by mouth daily.   . fenofibrate (TRICOR) 145 MG tablet Take 145 mg by mouth daily.   Marland Kitchen. glimepiride (AMARYL) 4 MG tablet Take 4 mg by mouth daily before breakfast.   . HYDROcodone-acetaminophen (NORCO) 10-325 MG per tablet Take 1 tablet by mouth 3 (three) times daily. PRN  . Multiple Vitamin (MULTI VITAMIN DAILY PO) Take by mouth as needed.  . pantoprazole (PROTONIX) 40 MG tablet Take 1 tablet (40 mg total) by mouth daily.  . [DISCONTINUED] traMADol (ULTRAM) 50 MG tablet Take 50 mg by mouth as needed for moderate pain.      Objective: Blood pressure 126/76, pulse 72, temperature 98 F (36.7 C), temperature source Oral, resp. rate 18, height 6' (1.829 m), weight 248 lb 11.2 oz (112.81 kg). Patient is alert and in no acute distress. He has severe hearing impairment. Conjunctiva is pink.  Sclera is nonicteric Oropharyngeal mucosa is normal. No neck masses or thyromegaly noted. Cardiac exam with regular rhythm normal S1 and S2. No murmur or gallop noted. Lungs are clear to auscultation. Abdomen is full. Bowel sounds are normal. On palpation abdomen is soft and nontender without organomegaly or masses.  No LE edema or clubbing noted.  Labs/studies Results: CBC from 02/04/2014 BBC 8.5, H&H 14.6 and 42.5 and platelet count 344K. C. difficile by PCR was negative.   Assessment:  #1. Chronic ulcerative colitis. He has pancolitis and he is not in remission. If he does not respond to higher dose of oral mesalamine he may need need neuromodulator or biologic therapy. #2. GERD. Heartburn well controlled with when necessary use of pantoprazole.   Plan:  Increase Asacol HD to 1.6 g by mouth 3 times a day. Chin reminded that he cannot take OTC NSAIDs. Office visit in 3 months.

## 2014-05-31 ENCOUNTER — Telehealth (INDEPENDENT_AMBULATORY_CARE_PROVIDER_SITE_OTHER): Payer: Self-pay | Admitting: *Deleted

## 2014-05-31 NOTE — Telephone Encounter (Signed)
[employer] was called @[phone]. Spoke with [employer]. PA done over phone , it will take 24 - 72 hours before we hear if it is approved or not. They will also request clinical info if needed per [facility]. Reference number is [id number].

## 2014-06-24 ENCOUNTER — Telehealth (INDEPENDENT_AMBULATORY_CARE_PROVIDER_SITE_OTHER): Payer: Self-pay | Admitting: *Deleted

## 2014-06-24 NOTE — Telephone Encounter (Signed)
They were told by [employer] for the provider's office to call [phone] to explain that other medications has been use, tried and doesn't work. They will more than likely be approved. The return phone number is [phone]. This is for Asacol.

## 2014-06-27 NOTE — Telephone Encounter (Signed)
[employer] was called. Representative states that we can sen the following information to them for review: The patient's name , address, member ID -H [id number], The reason why the sulfadiazine ,balazalizide , are not working or would not be good for the patient. We are to attach any office notes, ect to support this. Or Dr.Rehman can call [phone] to talk with a representative/expedite.  Patient was called , and a message ws left on his voice mail.

## 2014-07-01 NOTE — Telephone Encounter (Signed)
I will wait for their response.

## 2014-08-13 ENCOUNTER — Encounter (INDEPENDENT_AMBULATORY_CARE_PROVIDER_SITE_OTHER): Payer: Self-pay | Admitting: Internal Medicine

## 2014-08-13 ENCOUNTER — Ambulatory Visit (INDEPENDENT_AMBULATORY_CARE_PROVIDER_SITE_OTHER): Payer: Medicare PPO | Admitting: Internal Medicine

## 2014-08-13 VITALS — BP 128/78 | HR 76 | Temp 97.2°F | Resp 18 | Ht 72.0 in | Wt 238.6 lb

## 2014-08-13 DIAGNOSIS — K219 Gastro-esophageal reflux disease without esophagitis: Secondary | ICD-10-CM

## 2014-08-13 DIAGNOSIS — K51911 Ulcerative colitis, unspecified with rectal bleeding: Secondary | ICD-10-CM

## 2014-08-13 MED ORDER — SULFASALAZINE 500 MG PO TABS: 1000.0000 mg | ORAL_TABLET | Freq: Three times a day (TID) | ORAL | Status: DC

## 2014-08-13 MED ORDER — PREDNISONE 10 MG PO TABS: 30.0000 mg | ORAL_TABLET | Freq: Every day | ORAL | Status: DC

## 2014-08-13 MED ORDER — PANTOPRAZOLE SODIUM 40 MG PO TBEC: 40.0000 mg | DELAYED_RELEASE_TABLET | Freq: Every day | ORAL | Status: AC

## 2014-08-13 MED ORDER — FOLIC ACID 800 MCG PO TABS: 400.0000 ug | ORAL_TABLET | Freq: Every day | ORAL | Status: DC

## 2014-08-13 NOTE — Progress Notes (Signed)
Presenting complaint;  Followup for ulcerative colitis.  Subjective:  Patient is a 73 year old Caucasian male who has chronic ulcerative colitis and is here for scheduled visit. He was last seen 3 months ago. He tried [employer] but had no benefit. He states he has had best response with Asacol HD but his insurance does not cover it and his co-pay is very high but she cannot afford. He is having in stools a day. He's also having nocturnal bowel movements. He is passing blood per rectum daily. He had CBC 10 days ago by his PCP and reportedly was normal. Patient says he has good appetite but he is afraid to read because it makes him have more diarrhea. He denies nausea vomiting fever chills or abdominal pain. He has lost 10 pounds in the last 3 months. He says pantoprazole is working in [employer] new prescription.   Current Medications: Outpatient Encounter Prescriptions as of 08/13/2014  Medication Sig  . citalopram (CELEXA) 20 MG tablet Take 20 mg by mouth daily.   Marland Kitchen. HYDROcodone-acetaminophen (NORCO) 10-325 MG per tablet Take 1 tablet by mouth 3 (three) times daily. PRN  . aspirin 81 MG tablet Take 81 mg by mouth daily.  . fenofibrate (TRICOR) 145 MG tablet Take 145 mg by mouth daily.   Marland Kitchen. glimepiride (AMARYL) 4 MG tablet Take 4 mg by mouth daily before breakfast.   . Mesalamine (ASACOL HD) 800 MG TBEC Take 2 tablets (1,600 mg total) by mouth 3 (three) times daily.  . Multiple Vitamin (MULTI VITAMIN DAILY PO) Take by mouth as needed.  . pantoprazole (PROTONIX) 40 MG tablet Take 1 tablet (40 mg total) by mouth daily.     Objective: Blood pressure 128/78, pulse 76, temperature 97.2 F (36.2 C), temperature source Oral, resp. rate 18, height 6' (1.829 m), weight 238 lb 9.6 oz (108.228 kg). Patient is alert and in no acute distress. He has severe hearing impairment. Conjunctiva is pink. Sclera is nonicteric Oropharyngeal mucosa is normal. No neck masses or thyromegaly noted. Cardiac exam with  regular rhythm normal S1 and S2. No murmur or gallop noted. Lungs are clear to auscultation. Abdomen. Bowel sounds are hyperactive. Abdomen is soft and nontender. No organomegaly or masses noted. He has trace edema around the ankles.   Assessment:  #1. Chronic ulcerative colitis. His disease remains active. High co-pay is hindering in his care. He is also due for a colonoscopy which will be delayed until he is in remission. [facility] active disease in sigmoid colon stricture. #2. GERD. PPI is working.  Plan:  Prednisone 30 mg daily for one week and thereafter top dose by 5 mg every week. Sulfasalazine 1 g by mouth 3 times a day. Folic acid 1 mg by mouth daily. Office visit in 3 months.

## 2014-08-13 NOTE — Patient Instructions (Signed)
Prednisone schedule as follows. 30 mg daily for one week. 25 mg daily for one week. 20 mg daily for one week. 15 mg daily for one week. 10 mg daily for one week. 5 mg daily for one week and stop.

## 2014-08-27 ENCOUNTER — Telehealth: Payer: Self-pay | Admitting: Internal Medicine

## 2014-08-28 ENCOUNTER — Encounter: Payer: Self-pay | Admitting: Physician Assistant

## 2014-09-02 ENCOUNTER — Encounter: Payer: Self-pay | Admitting: Nurse Practitioner

## 2014-09-02 ENCOUNTER — Inpatient Hospital Stay (HOSPITAL_COMMUNITY)
Admission: AD | Admit: 2014-09-02 | Discharge: 2014-10-01 | DRG: 329 | Disposition: A | Discharge status: Home / Self Care | Payer: Medicare PPO | Source: Ambulatory Visit | Attending: Internal Medicine | Admitting: Internal Medicine

## 2014-09-02 ENCOUNTER — Encounter (HOSPITAL_COMMUNITY): Payer: Self-pay

## 2014-09-02 ENCOUNTER — Ambulatory Visit (INDEPENDENT_AMBULATORY_CARE_PROVIDER_SITE_OTHER): Payer: Self-pay | Admitting: Nurse Practitioner

## 2014-09-02 VITALS — BP 110/74 | HR 80 | Ht 72.0 in | Wt 220.0 lb

## 2014-09-02 DIAGNOSIS — E860 Dehydration: Secondary | ICD-10-CM | POA: Diagnosis present

## 2014-09-02 DIAGNOSIS — K5669 Other intestinal obstruction: Secondary | ICD-10-CM | POA: Diagnosis present

## 2014-09-02 DIAGNOSIS — R531 Weakness: Secondary | ICD-10-CM

## 2014-09-02 DIAGNOSIS — K51911 Ulcerative colitis, unspecified with rectal bleeding: Secondary | ICD-10-CM

## 2014-09-02 DIAGNOSIS — I482 Chronic atrial fibrillation, unspecified: Secondary | ICD-10-CM

## 2014-09-02 DIAGNOSIS — Z9049 Acquired absence of other specified parts of digestive tract: Secondary | ICD-10-CM

## 2014-09-02 DIAGNOSIS — K56699 Other intestinal obstruction unspecified as to partial versus complete obstruction: Secondary | ICD-10-CM

## 2014-09-02 DIAGNOSIS — Z6829 Body mass index (BMI) 29.0-29.9, adult: Secondary | ICD-10-CM | POA: Diagnosis not present

## 2014-09-02 DIAGNOSIS — Z800 Family history of malignant neoplasm of digestive organs: Secondary | ICD-10-CM

## 2014-09-02 DIAGNOSIS — K922 Gastrointestinal hemorrhage, unspecified: Secondary | ICD-10-CM | POA: Diagnosis present

## 2014-09-02 DIAGNOSIS — Z85828 Personal history of other malignant neoplasm of skin: Secondary | ICD-10-CM | POA: Diagnosis not present

## 2014-09-02 DIAGNOSIS — D500 Iron deficiency anemia secondary to blood loss (chronic): Secondary | ICD-10-CM | POA: Diagnosis present

## 2014-09-02 DIAGNOSIS — L97429 Non-pressure chronic ulcer of left heel and midfoot with unspecified severity: Secondary | ICD-10-CM | POA: Diagnosis present

## 2014-09-02 DIAGNOSIS — K51912 Ulcerative colitis, unspecified with intestinal obstruction: Principal | ICD-10-CM

## 2014-09-02 DIAGNOSIS — K5190 Ulcerative colitis, unspecified, without complications: Secondary | ICD-10-CM | POA: Diagnosis present

## 2014-09-02 DIAGNOSIS — K651 Peritoneal abscess: Secondary | ICD-10-CM

## 2014-09-02 DIAGNOSIS — D638 Anemia in other chronic diseases classified elsewhere: Secondary | ICD-10-CM | POA: Diagnosis present

## 2014-09-02 DIAGNOSIS — K921 Melena: Secondary | ICD-10-CM | POA: Diagnosis present

## 2014-09-02 DIAGNOSIS — I872 Venous insufficiency (chronic) (peripheral): Secondary | ICD-10-CM | POA: Diagnosis present

## 2014-09-02 DIAGNOSIS — Z7952 Long term (current) use of systemic steroids: Secondary | ICD-10-CM | POA: Diagnosis not present

## 2014-09-02 DIAGNOSIS — Z955 Presence of coronary angioplasty implant and graft: Secondary | ICD-10-CM

## 2014-09-02 DIAGNOSIS — Z87891 Personal history of nicotine dependence: Secondary | ICD-10-CM | POA: Diagnosis not present

## 2014-09-02 DIAGNOSIS — K6289 Other specified diseases of anus and rectum: Secondary | ICD-10-CM | POA: Diagnosis present

## 2014-09-02 DIAGNOSIS — Z79899 Other long term (current) drug therapy: Secondary | ICD-10-CM | POA: Diagnosis not present

## 2014-09-02 DIAGNOSIS — H9190 Unspecified hearing loss, unspecified ear: Secondary | ICD-10-CM | POA: Diagnosis present

## 2014-09-02 DIAGNOSIS — Z95828 Presence of other vascular implants and grafts: Secondary | ICD-10-CM

## 2014-09-02 DIAGNOSIS — G9340 Encephalopathy, unspecified: Secondary | ICD-10-CM | POA: Diagnosis present

## 2014-09-02 DIAGNOSIS — Z452 Encounter for adjustment and management of vascular access device: Secondary | ICD-10-CM

## 2014-09-02 DIAGNOSIS — D62 Acute posthemorrhagic anemia: Secondary | ICD-10-CM | POA: Diagnosis present

## 2014-09-02 DIAGNOSIS — I2510 Atherosclerotic heart disease of native coronary artery without angina pectoris: Secondary | ICD-10-CM | POA: Diagnosis present

## 2014-09-02 DIAGNOSIS — E43 Unspecified severe protein-calorie malnutrition: Secondary | ICD-10-CM

## 2014-09-02 DIAGNOSIS — E119 Type 2 diabetes mellitus without complications: Secondary | ICD-10-CM

## 2014-09-02 DIAGNOSIS — R532 Functional quadriplegia: Secondary | ICD-10-CM | POA: Diagnosis present

## 2014-09-02 DIAGNOSIS — Z86711 Personal history of pulmonary embolism: Secondary | ICD-10-CM | POA: Diagnosis not present

## 2014-09-02 DIAGNOSIS — K219 Gastro-esophageal reflux disease without esophagitis: Secondary | ICD-10-CM | POA: Diagnosis present

## 2014-09-02 DIAGNOSIS — Z9119 Patient's noncompliance with other medical treatment and regimen: Secondary | ICD-10-CM | POA: Diagnosis present

## 2014-09-02 DIAGNOSIS — D72829 Elevated white blood cell count, unspecified: Secondary | ICD-10-CM

## 2014-09-02 DIAGNOSIS — E869 Volume depletion, unspecified: Secondary | ICD-10-CM

## 2014-09-02 HISTORY — DX: Other intestinal obstruction unspecified as to partial versus complete obstruction: K56.699

## 2014-09-02 HISTORY — DX: Ulcerative colitis, unspecified, without complications: K51.90

## 2014-09-02 LAB — CBC
HEMATOCRIT: 40.1 % (ref 39.0–52.0)
Hemoglobin: 14 g/dL (ref 13.0–17.0)
MCH: 32 pg (ref 26.0–34.0)
MCHC: 34.9 g/dL (ref 30.0–36.0)
MCV: 91.8 fL (ref 78.0–100.0)
Platelets: 389 10*3/uL (ref 150–400)
RBC: 4.37 MIL/uL (ref 4.22–5.81)
RDW: 13.1 % (ref 11.5–15.5)
WBC: 9.6 10*3/uL (ref 4.0–10.5)

## 2014-09-02 LAB — COMPREHENSIVE METABOLIC PANEL
ALBUMIN: 2.9 g/dL — AB (ref 3.5–5.2)
ALK PHOS: 58 U/L (ref 39–117)
ALT: 14 U/L (ref 0–53)
AST: 11 U/L (ref 0–37)
Anion gap: 16 — ABNORMAL HIGH (ref 5–15)
BILIRUBIN TOTAL: 0.4 mg/dL (ref 0.3–1.2)
BUN: 20 mg/dL (ref 6–23)
CHLORIDE: 90 meq/L — AB (ref 96–112)
CO2: 26 mEq/L (ref 19–32)
Calcium: 9.2 mg/dL (ref 8.4–10.5)
Creatinine, Ser: 0.99 mg/dL (ref 0.50–1.35)
GFR calc Af Amer: >90 mL/min (ref >90)
GFR calc non Af Amer: 80 mL/min — ABNORMAL LOW (ref >90)
Glucose, Bld: 159 mg/dL — ABNORMAL HIGH (ref 70–99)
POTASSIUM: 4.2 meq/L (ref 3.7–5.3)
Sodium: 132 mEq/L — ABNORMAL LOW (ref 137–147)
Total Protein: 7.1 g/dL (ref 6.0–8.3)

## 2014-09-02 LAB — CLOSTRIDIUM DIFFICILE BY PCR: Toxigenic C. Difficile by PCR: NEGATIVE

## 2014-09-02 LAB — GLUCOSE, CAPILLARY: Glucose-Capillary: 155 mg/dL — ABNORMAL HIGH (ref 70–99)

## 2014-09-02 MED ORDER — SODIUM CHLORIDE 0.9 % IV SOLN
INTRAVENOUS | Status: AC
  Administered 2014-09-02 – 2014-09-03 (×4): via INTRAVENOUS
  Administered 2014-09-04: 500 mL via INTRAVENOUS
  Administered 2014-09-04 – 2014-09-06 (×5): via INTRAVENOUS

## 2014-09-02 MED ORDER — CITALOPRAM HYDROBROMIDE 20 MG PO TABS
20.0000 mg | ORAL_TABLET | Freq: Every day | ORAL | Status: DC
  Administered 2014-09-02 – 2014-09-09 (×8): 20 mg via ORAL
  Filled 2014-09-02 (×9): qty 1

## 2014-09-02 MED ORDER — INFLUENZA VAC SPLIT QUAD 0.5 ML IM SUSY
0.5000 mL | PREFILLED_SYRINGE | INTRAMUSCULAR | Status: AC
  Administered 2014-09-03: 0.5 mL via INTRAMUSCULAR
  Filled 2014-09-02 (×2): qty 0.5

## 2014-09-02 MED ORDER — METHYLPREDNISOLONE SODIUM SUCC 40 MG IJ SOLR
20.0000 mg | Freq: Two times a day (BID) | INTRAMUSCULAR | Status: DC
  Administered 2014-09-02 – 2014-09-03 (×4): 20 mg via INTRAVENOUS
  Filled 2014-09-02 (×5): qty 0.5

## 2014-09-02 MED ORDER — ONDANSETRON HCL 4 MG/2ML IJ SOLN
4.0000 mg | Freq: Four times a day (QID) | INTRAMUSCULAR | Status: DC | PRN
  Administered 2014-09-02 – 2014-09-11 (×2): 4 mg via INTRAVENOUS
  Filled 2014-09-02 (×3): qty 2

## 2014-09-02 MED ORDER — ONDANSETRON HCL 4 MG PO TABS
4.0000 mg | ORAL_TABLET | Freq: Four times a day (QID) | ORAL | Status: DC | PRN
  Administered 2014-09-05: 4 mg via ORAL
  Filled 2014-09-02: qty 1

## 2014-09-02 MED ORDER — INSULIN ASPART 100 UNIT/ML ~~LOC~~ SOLN
0.0000 [IU] | Freq: Three times a day (TID) | SUBCUTANEOUS | Status: DC
Start: 2014-09-02 — End: 2014-09-06
  Administered 2014-09-02 – 2014-09-05 (×5): 3 [IU] via SUBCUTANEOUS
  Administered 2014-09-06: 2 [IU] via SUBCUTANEOUS
  Administered 2014-09-06: 3 [IU] via SUBCUTANEOUS

## 2014-09-02 MED ORDER — HYDROCODONE-ACETAMINOPHEN 5-325 MG PO TABS
1.0000 | ORAL_TABLET | ORAL | Status: DC | PRN
  Administered 2014-09-02 – 2014-09-07 (×16): 2 via ORAL
  Administered 2014-09-08: 1 via ORAL
  Administered 2014-09-08 – 2014-09-10 (×6): 2 via ORAL
  Filled 2014-09-02 (×4): qty 2
  Filled 2014-09-02: qty 1
  Filled 2014-09-02 (×19): qty 2

## 2014-09-02 MED ORDER — FOLIC ACID 800 MCG PO TABS: 400.0000 ug | ORAL_TABLET | Freq: Every day | ORAL | Status: DC

## 2014-09-02 MED ORDER — FOLIC ACID 1 MG PO TABS
0.5000 mg | ORAL_TABLET | Freq: Every day | ORAL | Status: DC
  Administered 2014-09-03 – 2014-09-09 (×7): 0.5 mg via ORAL
  Filled 2014-09-02 (×8): qty 1

## 2014-09-02 MED ORDER — PANTOPRAZOLE SODIUM 40 MG PO TBEC
40.0000 mg | DELAYED_RELEASE_TABLET | Freq: Every day | ORAL | Status: DC
  Administered 2014-09-02 – 2014-09-09 (×8): 40 mg via ORAL
  Filled 2014-09-02 (×10): qty 1

## 2014-09-02 MED ORDER — PNEUMOCOCCAL VAC POLYVALENT 25 MCG/0.5ML IJ INJ
0.5000 mL | INJECTION | INTRAMUSCULAR | Status: AC
  Administered 2014-09-03: 0.5 mL via INTRAMUSCULAR
  Filled 2014-09-02 (×2): qty 0.5

## 2014-09-02 MED ORDER — CETYLPYRIDINIUM CHLORIDE 0.05 % MT LIQD
7.0000 mL | Freq: Two times a day (BID) | OROMUCOSAL | Status: DC
  Administered 2014-09-02 – 2014-09-17 (×30): 7 mL via OROMUCOSAL

## 2014-09-02 NOTE — Progress Notes (Addendum)
HPI :  Patient is a 73 year old male with long-standing ulcerative colitis. History is difficult to obtain as patient is hard of hearing and not a great historian. The best I can tell, patient was seen here a few times in 2008 by Dr. Jarold MottoPatterson (I don't have access to those records at this time). Patient then established care with Dr. [place]arilyn [place]aehman in [place] . We saw him for a hospital consultation in 2010 haven't seen him since.  It is not clear where the patient was receiving care from 2010 to April 2014 when he reestablished care with Dr. Karilyn Cotaehman. Patient tells me he has been on and off Mesalamine agents for years.  He has taken prednisone from time to time as well. Patient has chronic diarrhea but several weeks ago he developed bloody stools, abdominal pain and significant weight loss. Patient saw Dr. Karilyn Cotaehman earlier this month. He was given sulfasalazine and a steroid taper. Patient took 10 days of the prednisone and sulfasalazine and decided he was not getting better and discontinued treatment. He called our office with a desire to reestablish care with us. Dr. Rhea BeltonPyrtle reviewed records, accepted the patient.   Patient describes multiple episodes of bloody diarrhea today. His appetite is poor, weight loss significant. He feels absolutely terrible. Insurance needs prior auth to pay for Asacol HD. Colonoscopy was attempted in 2011. 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 Active colitis in the rectum and sigmoid colon were found.  Past Medical History  Diagnosis Date  . 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 Diabetes mellitus   . CAD (coronary artery disease)   . Personal history of other malignant neoplasm of skin   . Atrial fibrillation     Family History  Problem Relation Age of Onset  . Colon cancer Paternal Grandfather    History  Substance Use Topics  . Smoking status: Former Games developermoker  . Smokeless tobacco: Never Used  . Alcohol Use: No    Current Outpatient Prescriptions  Medication Sig Dispense Refill  . citalopram (CELEXA) 20 MG tablet Take 20 mg by mouth daily.       . fenofibrate (TRICOR) 145 MG tablet Take 145 mg by mouth daily.       . folic acid (FOLVITE) 800 MCG tablet Take 0.5 tablets (400 mcg total) by mouth daily.      Marland Kitchen. glimepiride (AMARYL) 4 MG tablet Take 4 mg by mouth daily before breakfast.       . HYDROcodone-acetaminophen (NORCO) 10-325 MG per tablet Take 1 tablet by mouth 3 (three) times daily. PRN      . Multiple Vitamin (MULTI VITAMIN DAILY PO) Take by mouth as needed.      . pantoprazole (PROTONIX) 40 MG tablet Take 1 tablet (40 mg total) by mouth daily.  30 tablet  11  . predniSONE (DELTASONE) 10 MG tablet Take 3 tablets (30 mg total) by mouth daily with breakfast.  100 tablet  0  . sulfaSALAzine (AZULFIDINE) 500 MG tablet Take 2 tablets (1,000 mg total) by mouth 3 (three) times daily.  180 tablet  5   No current facility-administered medications for this visit.   No Known Allergies   Review of Systems: All systems reviewed and negative except where noted in HPI.   Physical Exam: BP 110/74  Pulse 80  Ht 6' (1.829 m)  Wt 220 lb (99.791 kg)  BMI 29.83 kg/m2 Constitutional: Pleasant,well-developed, white male in no acute distress. HEENT: Normocephalic  and atraumatic. Conjunctivae are normal. No scleral icterus. Hard of hearing Neck supple.  Cardiovascular: Irregular rhythm, normal rate Pulmonary/chest: Effort normal and breath sounds normal. No wheezing, rales or rhonchi. Abdominal: Soft, nondistended, nontender. Bowel sounds active throughout. There are no masses palpable. No hepatomegaly. Extremities: no edema Lymphadenopathy: No cervical adenopathy noted. Neurological: Alert and oriented to person place and time. Skin: Skin is warm and dry. No rashes noted. Psychiatric: Normal mood and affect. Behavior is normal.   ASSESSMENT AND PLAN:  121. 73 year old male with long-standing  ulcerative colitis. There's been some medications compliance issues, mainly due to cost. Patient has gone several months at various times without any treatment. As best I can tell, he has never taken anything other than mesalamine and occasional prednisone. After being off meds for months patient was recently restarted on Mesalamine and given prednisone taper. No improvement after 10 days of treatment, patient stopped meds. He is weak, likely dehydrated and has had significant weight loss. He requires inpatient management now. Will admit to telemetry. Will give IV fluids, check labs, start IV Solu-Medrol, obtain stool studies to rule out C. difficile. Patient may need colonoscopy for further evaluation / restaging of UC. He may eventually need biologic therapy.   2.Hx atrial fibrillation / cardiac stents / diabetes. Admit to tele bed. Sliding scale insulin, hold Amaryl.   3. Hx PE. Will place SCD.   Addendum: Reviewed and agree with initial management. See admission H/P. Beverley FiedlerJay M Pyrtle, MD

## 2014-09-02 NOTE — H&P (Signed)
Patient seen, examined, and I agree with the above documentation, including the assessment and plan. Agree with the above. Will have to regroup on current activity of his ulcerative colitis and to what extent the colon is involved. Agree with exclusion of infectious processes, specifically C. Difficile. Expect he will need colonoscopy prior to discharge and escalation of therapy

## 2014-09-02 NOTE — Patient Instructions (Signed)
Go to [facility] registration desk inside front door. You will be admitted through Dr. Erick BlinksJay Pyrtle and Dr. Stan Headarl Gessner.

## 2014-09-02 NOTE — H&P (Signed)
HPI :  Patient is a 73 year old male with long-standing ulcerative colitis. History is difficult to obtain as patient is hard of hearing and not a great historian. The best I can tell, patient was seen here a few times in 2008 by Dr. Jarold MottoPatterson (I don't have access to those records at this time). Patient then established care with Dr. Karilyn Cotaehman in [place]. We saw him for a hospital consultation in 2010 haven't seen him since. It is not clear where the patient was receiving care from 2010 to April 2014 when he reestablished care with Dr. Karilyn Cotaehman. Patient tells me he has been on and off Mesalamine agents for years. He has taken prednisone from time to time as well. Patient has chronic diarrhea but several weeks ago he developed bloody stools, abdominal pain and significant weight loss. Patient saw Dr. Karilyn Cotaehman earlier this month. He was given sulfasalazine and a steroid taper. Patient took 10 days of the prednisone and sulfasalazine and decided he was not getting better and discontinued treatment. He called our office with a desire to reestablish care with us. Dr. Rhea BeltonPyrtle reviewed records, accepted the patient.   Patient describes multiple episodes of bloody diarrhea today. His appetite is poor, weight loss significant. He feels absolutely terrible. Insurance needs prior auth to pay for [facility] HD. Colonoscopy was attempted in 2011. 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 Active colitis in the rectum and sigmoid colon were found.  Past Medical History   Diagnosis  Date   .  UC (ulcerative colitis confined to rectum)    .  Diabetes mellitus    .  CAD (coronary artery disease)    .  Personal history of other malignant neoplasm of skin    .  Atrial fibrillation     Family History   Problem  Relation  Age of Onset   .  Colon cancer  Paternal Grandfather     History   Substance Use Topics   .  Smoking status:  Former Games developermoker   .  Smokeless tobacco:  Never Used     .  Alcohol Use:  No    Current Outpatient Prescriptions   Medication  Sig  Dispense  Refill   .  citalopram (CELEXA) 20 MG tablet  Take 20 mg by mouth daily.     .  fenofibrate (TRICOR) 145 MG tablet  Take 145 mg by mouth daily.     .  folic acid (FOLVITE) 800 MCG tablet  Take 0.5 tablets (400 mcg total) by mouth daily.     Marland Kitchen.  glimepiride (AMARYL) 4 MG tablet  Take 4 mg by mouth daily before breakfast.     .  HYDROcodone-acetaminophen (NORCO) 10-325 MG per tablet  Take 1 tablet by mouth 3 (three) times daily. PRN     .  Multiple Vitamin (MULTI VITAMIN DAILY PO)  Take by mouth as needed.     .  pantoprazole (PROTONIX) 40 MG tablet  Take 1 tablet (40 mg total) by mouth daily.  30 tablet  11   .  predniSONE (DELTASONE) 10 MG tablet  Take 3 tablets (30 mg total) by mouth daily with breakfast.  100 tablet  0   .  sulfaSALAzine (AZULFIDINE) 500 MG tablet  Take 2 tablets (1,000 mg total) by mouth 3 (three) times daily.  180 tablet  5    No current facility-administered medications for this visit.   No Known Allergies  Review of  Systems:  All systems reviewed and negative except where noted in HPI.  Physical Exam:  BP 110/74  Pulse 80  Ht 6' (1.829 m)  Wt 220 lb (99.791 kg)  BMI 29.83 kg/m2  Constitutional: Pleasant,well-developed, white male in no acute distress.  HEENT: Normocephalic and atraumatic. Conjunctivae are normal. No scleral icterus. Hard of hearing  Neck supple.  Cardiovascular: Irregular rhythm, normal rate  Pulmonary/chest: Effort normal and breath sounds normal. No wheezing, rales or rhonchi.  Abdominal: Soft, nondistended, nontender. Bowel sounds active throughout. There are no masses palpable. No hepatomegaly.  Extremities: no edema  Lymphadenopathy: No cervical adenopathy noted.  Neurological: Alert and oriented to person place and time.  Skin: Skin is warm and dry. No rashes noted.  Psychiatric: Normal mood and affect. Behavior is normal.   ASSESSMENT AND PLAN:  151.  73 year old male with long-standing ulcerative colitis. There's been some medications compliance issues, mainly due to cost. Patient has gone several months at various times without any treatment. As best I can tell, he has never taken anything other than mesalamine and occasional prednisone. After being off meds for months patient was recently restarted on Mesalamine and given prednisone taper. No improvement after 10 days of treatment, patient stopped meds. He is weak, likely dehydrated and has had significant weight loss. He requires inpatient management now. Will admit to telemetry. Will give IV fluids, check labs, start IV Solu-Medrol, obtain stool studies to rule out C. difficile. Patient may need colonoscopy for further evaluation / restaging of UC. He may eventually need biologic therapy.  2.Hx atrial fibrillation / cardiac stents / diabetes. Admit to tele bed. Sliding scale insulin, hold Amaryl.  3. Hx PE. Will place SCD.

## 2014-09-03 DIAGNOSIS — K51912 Ulcerative colitis, unspecified with intestinal obstruction: Secondary | ICD-10-CM | POA: Diagnosis not present

## 2014-09-03 DIAGNOSIS — E43 Unspecified severe protein-calorie malnutrition: Secondary | ICD-10-CM

## 2014-09-03 DIAGNOSIS — K51911 Ulcerative colitis, unspecified with rectal bleeding: Secondary | ICD-10-CM

## 2014-09-03 DIAGNOSIS — I482 Chronic atrial fibrillation: Secondary | ICD-10-CM

## 2014-09-03 LAB — BASIC METABOLIC PANEL
ANION GAP: 14 (ref 5–15)
BUN: 18 mg/dL (ref 6–23)
CALCIUM: 8.8 mg/dL (ref 8.4–10.5)
CO2: 24 meq/L (ref 19–32)
Chloride: 95 mEq/L — ABNORMAL LOW (ref 96–112)
Creatinine, Ser: 0.78 mg/dL (ref 0.50–1.35)
GFR calc Af Amer: >90 mL/min (ref >90)
GFR, EST NON AFRICAN AMERICAN: 88 mL/min — AB (ref >90)
Glucose, Bld: 119 mg/dL — ABNORMAL HIGH (ref 70–99)
POTASSIUM: 4.3 meq/L (ref 3.7–5.3)
Sodium: 133 mEq/L — ABNORMAL LOW (ref 137–147)

## 2014-09-03 LAB — GLUCOSE, CAPILLARY
GLUCOSE-CAPILLARY: 152 mg/dL — AB (ref 70–99)
GLUCOSE-CAPILLARY: 117 mg/dL — AB (ref 70–99)
GLUCOSE-CAPILLARY: 198 mg/dL — AB (ref 70–99)
Glucose-Capillary: 156 mg/dL — ABNORMAL HIGH (ref 70–99)

## 2014-09-03 MED ORDER — PEG-KCL-NACL-NASULF-NA ASC-C 100 G PO SOLR
0.5000 | Freq: Once | ORAL | Status: AC
  Administered 2014-09-04: 100 g via ORAL

## 2014-09-03 MED ORDER — PEG-KCL-NACL-NASULF-NA ASC-C 100 G PO SOLR: 1.0000 | Freq: Once | ORAL | Status: DC

## 2014-09-03 MED ORDER — PEG-KCL-NACL-NASULF-NA ASC-C 100 G PO SOLR
0.5000 | Freq: Once | ORAL | Status: AC
  Administered 2014-09-03: 100 g via ORAL
  Filled 2014-09-03: qty 1

## 2014-09-03 NOTE — Progress Notes (Addendum)
     [facility] Progress Note  Subjective:  Feels better since coming into the hospital yesterday/starting steroids.  Still having small amounts of bloody diarrhea.  Some abdominal pain that is controlled with PO vicodin.  EKG not performed when ordered yesterday so nurse is going to have that done, but in Afib on some of tele strips.  Objective:  Vital signs in last 24 hours: Temp:  [98.2 F (36.8 C)-98.4 F (36.9 C)] 98.2 F (36.8 C) (10/27 0503) Pulse Rate:  [98-103] 98 (10/27 0503) Resp:  [10-18] 18 (10/27 0503) BP: (126-134)/(65-87) 130/65 mmHg (10/27 0503) SpO2:  [94 %-98 %] 98 % (10/27 0503) Weight:  [216 lb 3.2 oz (98.068 kg)] 216 lb 3.2 oz (98.068 kg) (10/26 1330) Last BM Date: 09/02/14 General:  Alert, Well-developed, in NAD Heart:  Irregularly irregular. Pulm:  CTAB.  No W/R/R. Abdomen:  Soft, non-distended.  BS present.  Mild diffuse TTP without R/R/G. Extremities:  Without edema. Neurologic:  Alert and  oriented x4;  grossly normal neurologically. Psych:  Alert and cooperative. Normal mood and affect.  Intake/Output from previous day: 10/26 0701 - 10/27 0700 In: 2241 [P.O.:361; I.V.:1880] Out: 11 [Stool:11]  Lab Results:  Recent Labs  09/02/14 1130  WBC 9.6  HGB 14.0  HCT 40.1  PLT 389   BMET  Recent Labs  09/02/14 1130  NA 132*  K 4.2  CL 90*  CO2 26  GLUCOSE 159*  BUN 20  CREATININE 0.99  CALCIUM 9.2   LFT  Recent Labs  09/02/14 1130  PROT 7.1  ALBUMIN 2.9*  AST 11  ALT 14  ALKPHOS 58  BILITOT 0.4   Assessment / Plan: 631. 73 year old male with long-standing ulcerative colitis, now with flare with abdominal pain and bloody diarrhea along with weight loss. There's been some medications compliance issues, mainly due to cost. Patient has gone several months at various times without any treatment. As best as we can tell, he has never taken anything other than mesalamine and occasional prednisone. After being off meds for  months patient was recently restarted on Mesalamine and given prednisone taper. No improvement after 10 days of treatment, patient stopped meds.  Cdiff negative.  Continue IV Solu-Medrol 20 mg BID for now.  Await repeat BMP results from this AM.  Plan for colonoscopy for further evaluation / restaging of UC tomorrow, 10/28 at 9 AM. He may eventually need biologic therapy.  2.Hx atrial fibrillation / cardiac stents / diabetes:  Continue tele. Sliding scale insulin.  3. Hx PE:  SCD's are hurting his legs so we will discontinue those and order foot pumps. 4. Atrial fibrillation on telemetry. Cards consult. 5. Severe protein calorie malnutrition - will get dietitian to see and recommend diet changes 6. LLE wound - need to examine   LOS: 1 day   ZEHR, JESSICA D.  09/03/2014, 9:43 AM  Pager number [phone]   [facility] Attending  I have also seen and assessed the patient and agree with the above note. He is better since admission but still having abdominal cramps and blody diarrhea. Colonoscopy tomorrow. The risks and benefits as well as alternatives of endoscopic procedure(s) have been discussed and reviewed. All questions answered. The patient agrees to proceed.  Cardiology has seen him - rate control planned I will look at LLE wound tomorrow  Iva Booparl E. Labresha Mellor, MD, Gpddc LLCFACG [facility] [phone] (pager) 09/03/2014 7:37 PM

## 2014-09-03 NOTE — Care Management Note (Addendum)
Page 1 of 2   10/01/2014     11:24:56 AM CARE MANAGEMENT NOTE 10/01/2014  Patient:  Ronald Mayo,Ronald Mayo   Account Number:  [id number]  Date Initiated:  09/03/2014  Documentation initiated by:  Lanier ClamMAHABIR,KATHY  Subjective/Objective Assessment:   73 Y/O Mayo ADMITTED W/ULCERATIVE COLITIS.VH:QIONGEXBMWHX:ULCERATIVE COLITIS.     Action/Plan:   FROM HOME.   Anticipated DC Date:  10/01/2014   Anticipated DC Plan:  HOME W HOME HEALTH SERVICES      DC Planning Services  CM consult      [facility]   Choice offered to / List presented to:  C-4 Adult Children        HH arranged  HH-1 RN  HH-10 DISEASE MANAGEMENT  HH-4 NURSE'S AIDE      [facility]   Status of service:  Completed, signed off Medicare Important Message given?  YES (If response is "NO", the following Medicare IM given date fields will be blank) Date Medicare IM given:  09/09/2014 Medicare IM given by:  Lanier ClamMAHABIR,KATHY Date Additional Medicare IM given:  09/30/2014 Additional Medicare IM given by:  Lorenda IshiharaSUZANNE Zyrion Coey  [facility]  Per UR Regulation:  Reviewed for med. necessity/level of care/duration of stay  If discussed at Long Length of Stay Meetings, dates discussed:   09/10/2014  09/12/2014  09/24/2014  09/26/2014    Comments:  10-01-14 Lorenda IshiharaSuzanne Justiss Gerbino RN CM 1122 Patient and daughter decline SNF offers, want to d/c to home with [facility] agency. Contacted them and [facility]. Faxed information and orders to them.  09/26/14 KATHY MAHABIR RN,BSN NCM [phone] MESENTERIC ABSCESS, S/P COLECTOMY,WEAN TPN.D/C PLAN SNF.  09/24/14 14:20 CM notes pt to go to SNF; CSW arranging.  No other CM needs were communicated.  Freddy JakschSarah Jeffries, BSN, [place] [phone].  [phone]/Rhonda Davis,RN,BSn,CCM: s/p Procedure(s): TOTAL COLECTOMY/PERMANENT ILEOSTOMY Patient Active Problem List    Diagnosis  Date Noted    Ulcerative colitis with intestinal  obstruction      Stricture of sigmoid colon  09/04/2014    Protein-calorie malnutrition, severe  09/03/2014    Ulcerative colitis  09/02/2014    Weakness  09/02/2014    Diabetes  09/02/2014    GERD (gastroesophageal reflux disease)  02/04/2014    Depression  08/07/2013    SYNCOPE AND COLLAPSE  05/07/2010    SHORTNESS OF BREATH  05/07/2010    Coronary atherosclerosis  05/14/2009    Chronic atrial fibrillation  05/14/2009    SKIN CANCER, HX OF  05/14/2009  Post op course at high risk of complications due to chronic medical problems and steroid use. Plan: Hgb stable, start hep gtt   WBC rising.  Will get CT to eval for abscess Cont to ambulate frequently NPO until ileus resolves  [id number]/Assessment/Plan: per chart Rhonda Davis,RN,BSN,CCM POD 1 tac/ileostomy 1. Continue pca 2. Pulmonary toilet 3. afib chronic, do not want to start ac for at least 48 hours after surgery, will have cards see again for recs given gi bleed is now gone 4. Npo, dc ng tube, bid dressing changes, continue tna 5. Sq heparin, protonix 6. Pt consult 7. Stay in stepdown until tomorrow [facility] 09/11/2014      [id number]/Rhonda Davis,RN,BSn,CCM: Chart note: Principal Problem:   Ulcerative colitis with intestinal obstruction Active Problems:   Atrial fibrillation/  GERD (gastroesophageal reflux disease)/  Ulcerative colitis/  Diabetes/  Protein-calorie malnutrition, severe/  Stricture of sigmoid colon  Currently day 1 s/p  total abdominal colectomy with end ileostomy. No chest pain or dyspnea presently, but with post-op pain.  Pt has permanent AF. Rate now is 100-105, but BP somewhat low at 98 to 105 systolic. He has received NS bolus. Currently NPO. Will write order for IV Lopressor 2.5 mg on a PRN basis if HR > 120. As BP increases can then change to every 4 hrs for rate control if BP stable and transition to oral therapy. Consider starting oral anticoagulation in several days when stable from his  abdominal surgery. 09/09/14 KATHY MAHABIR RN,BSN NCM [phone] FOR COLECTOMY/END ILEOSTOMY IN AM.TNA.NO ANTICIPATED D/C NEEDS.  09/03/14 [facility] LL[facility]Y MAHABIR RN, BSN, NCM [phone] MONITOR PROGRESS.NO ANTICIPATED D/C NEEDS.

## 2014-09-03 NOTE — Progress Notes (Addendum)
INITIAL NUTRITION ASSESSMENT  DOCUMENTATION CODES Per approved criteria  -Severe malnutrition in the context of chronic illness  Pt meets criteria for severe MALNUTRITION in the context of chronic illness as evidenced by 14% body weight loss in 3 months, PO intake < 75% for > one month.  INTERVENTION: -Diet advancement per MD; recommend Soft/Low Fiber diet -Would benefit from UC nutrition education once diet advanced -Nutrition supplement as tolerated; recommend Carnation Instant Breakfast for low-fiber/residue option -RD to continue to monitor   NUTRITION DIAGNOSIS: Inadequate oral intake related to nausea/abd pain as evidenced by PO intake < 75% for 2 months, 30 lb weight loss in 3 months.   Goal: Pt to meet >/= 90% of their estimated nutrition needs    Monitor:  Total protein/energy intake, labs, weights, GI profile, diet order, education needs  Reason for Assessment: MST  73 y.o. male  Admitting Dx: <principal problem not specified>  ASSESSMENT: Patient describes multiple episodes of bloody diarrhea today. His appetite is poor, weight loss significant. He feels absolutely terrible. Insurance needs prior auth to pay for As[facility] HD. Colonoscopy was attempted in 2011. 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 Active colitis in the rectum and sigmoid colon were found.  -Pt reported decreased appetite since 07/2014. Noted feelings of nausea, abd pain and loose stools that have inhibited intake -Pt did not provide specific food items on diet recall, reported that he consumed small amount of "junk food" during the day as he was not able to tolerate full meals. Has tried Ensure/Boost drinks before, but was unable to tolerate -Endorsed weight loss. Reported usual body weight of 250 lb; indicating approximately 35 lb weight loss in  3 months (14% body weight loss, severe for time frame) -Pt NPO upon admit, and diet advanced to clear liquids. Pt had not yet  received lunch tray during RD assessment. -Continues with loose bloody stools. C.diff negative. Plan to undergo colonoscopy for further eval -Had questions regarding appropriate diet for colitis/UC, requested RD follow up when diet advanced to solid foods -Would likely benefit from addition of low fiber nutrition supplement-Carnation Instant Breakfast for nutrient replenishment when diet advanced to [facility] or solid  Height: Ht Readings from Last 1 Encounters:  09/02/14 6' (1.829 m)    Weight: Wt Readings from Last 1 Encounters:  09/02/14 216 lb 3.2 oz (98.068 kg)    Ideal Body Weight: 178 lb  % Ideal Body Weight: 121%  Wt Readings from Last 10 Encounters:  09/02/14 216 lb 3.2 oz (98.068 kg)  09/02/14 220 lb (99.791 kg)  08/13/14 238 lb 9.6 oz (108.228 kg)  05/13/14 248 lb 11.2 oz (112.81 kg)  02/04/14 251 lb 4.8 oz (113.989 kg)  08/07/13 255 lb 4.8 oz (115.803 kg)  02/06/13 267 lb 11.2 oz (121.428 kg)  08/09/12 262 lb (118.842 kg)  05/09/12 258 lb 14.4 oz (117.436 kg)  04/19/12 254 lb 1.6 oz (115.259 kg)    Usual Body Weight: 250 lbs  % Usual Body Weight: 86%  BMI:  Body mass index is 29.32 kg/(m^2). Overweight  Estimated Nutritional Needs: Kcal: 2200-2400 Protein: 115-130 gram Fluid: >/= 2200 ml daily  Skin: WDL  Diet Order: Clear Liquid  EDUCATION NEEDS: -Education not appropriate at this time   Intake/Output Summary (Last 24 hours) at 09/03/14 1245 Last data filed at 09/03/14 1042  Gross per 24 hour  Intake   2241 ml  Output     11 ml  Net   2230  ml    Last BM: 10/26   Labs:   Recent Labs Lab 09/02/14 1130 09/03/14 1040  NA 132* 133*  K 4.2 4.3  CL 90* 95*  CO2 26 24  BUN 20 18  CREATININE 0.99 0.78  CALCIUM 9.2 8.8  GLUCOSE 159* 119*    CBG (last 3)   Recent Labs  09/02/14 2058 09/03/14 0802 09/03/14 1115  GLUCAP 156* 152* 117*    Scheduled Meds: . antiseptic oral rinse  7 mL Mouth Rinse BID  . citalopram  20 mg Oral Daily  .  folic acid  0.5 mg Oral Daily  . insulin aspart  0-15 Units Subcutaneous TID WC  . methylPREDNISolone (SOLU-MEDROL) injection  20 mg Intravenous Q12H  . pantoprazole  40 mg Oral Daily  . peg 3350 powder  0.5 kit Oral Once   And  . [START ON 09/04/2014] peg 3350 powder  0.5 kit Oral Once    Continuous Infusions: . sodium chloride 100 mL/hr at 09/03/14 1914    Past Medical History  Diagnosis Date  . 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 Diabetes mellitus   . CAD (coronary artery disease)   . Personal history of other malignant neoplasm of skin   . Atrial fibrillation     Past Surgical History  Procedure Laterality Date  . Cardiac stents    . Leg surgeries      multiple  . [place], umbilical hernia repair      Atlee Abide MS RD LDN Clinical Dietitian [phone]
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
## 2014-09-04 ENCOUNTER — Inpatient Hospital Stay (HOSPITAL_COMMUNITY): Payer: Medicare PPO

## 2014-09-04 ENCOUNTER — Encounter (HOSPITAL_COMMUNITY)
Admission: AD | Disposition: A | Discharge status: Home / Self Care | Payer: Self-pay | Source: Ambulatory Visit | Attending: Internal Medicine

## 2014-09-04 ENCOUNTER — Encounter (HOSPITAL_COMMUNITY): Payer: Self-pay

## 2014-09-04 DIAGNOSIS — K56699 Other intestinal obstruction unspecified as to partial versus complete obstruction: Secondary | ICD-10-CM

## 2014-09-04 DIAGNOSIS — K5669 Other intestinal obstruction: Secondary | ICD-10-CM

## 2014-09-04 HISTORY — PX: COLONOSCOPY: SHX5424

## 2014-09-04 HISTORY — DX: Other intestinal obstruction unspecified as to partial versus complete obstruction: K56.699

## 2014-09-04 LAB — CBC
HEMATOCRIT: 35.7 % — AB (ref 39.0–52.0)
Hemoglobin: 11.7 g/dL — ABNORMAL LOW (ref 13.0–17.0)
MCH: 30.8 pg (ref 26.0–34.0)
MCHC: 32.8 g/dL (ref 30.0–36.0)
MCV: 93.9 fL (ref 78.0–100.0)
Platelets: 386 10*3/uL (ref 150–400)
RBC: 3.8 MIL/uL — ABNORMAL LOW (ref 4.22–5.81)
RDW: 13.6 % (ref 11.5–15.5)
WBC: 7.7 10*3/uL (ref 4.0–10.5)

## 2014-09-04 LAB — BASIC METABOLIC PANEL
ANION GAP: 12 (ref 5–15)
BUN: 15 mg/dL (ref 6–23)
CALCIUM: 8.4 mg/dL (ref 8.4–10.5)
CO2: 26 mEq/L (ref 19–32)
Chloride: 100 mEq/L (ref 96–112)
Creatinine, Ser: 0.86 mg/dL (ref 0.50–1.35)
GFR calc non Af Amer: 85 mL/min — ABNORMAL LOW (ref >90)
Glucose, Bld: 171 mg/dL — ABNORMAL HIGH (ref 70–99)
Potassium: 4.6 mEq/L (ref 3.7–5.3)
SODIUM: 138 meq/L (ref 137–147)

## 2014-09-04 LAB — GLUCOSE, CAPILLARY
GLUCOSE-CAPILLARY: 181 mg/dL — AB (ref 70–99)
GLUCOSE-CAPILLARY: 115 mg/dL — AB (ref 70–99)
Glucose-Capillary: 155 mg/dL — ABNORMAL HIGH (ref 70–99)
Glucose-Capillary: 119 mg/dL — ABNORMAL HIGH (ref 70–99)
Glucose-Capillary: 109 mg/dL — ABNORMAL HIGH (ref 70–99)

## 2014-09-04 SURGERY — COLONOSCOPY
Anesthesia: Moderate Sedation

## 2014-09-04 MED ORDER — FENTANYL CITRATE 0.05 MG/ML IJ SOLN
INTRAMUSCULAR | Status: AC
  Filled 2014-09-04: qty 2

## 2014-09-04 MED ORDER — FENTANYL CITRATE 0.05 MG/ML IJ SOLN
INTRAMUSCULAR | Status: DC | PRN
  Administered 2014-09-04: 25 ug via INTRAVENOUS

## 2014-09-04 MED ORDER — IOHEXOL 300 MG/ML  SOLN
100.0000 mL | Freq: Once | INTRAMUSCULAR | Status: AC | PRN
  Administered 2014-09-04: 100 mL via INTRAVENOUS

## 2014-09-04 MED ORDER — MIDAZOLAM HCL 5 MG/5ML IJ SOLN
INTRAMUSCULAR | Status: DC | PRN
  Administered 2014-09-04: 1 mg via INTRAVENOUS

## 2014-09-04 MED ORDER — SODIUM CHLORIDE 0.9 % IV SOLN: INTRAVENOUS | Status: DC

## 2014-09-04 MED ORDER — MIDAZOLAM HCL 10 MG/2ML IJ SOLN
INTRAMUSCULAR | Status: AC
  Filled 2014-09-04: qty 2

## 2014-09-04 MED ORDER — IOHEXOL 300 MG/ML  SOLN
25.0000 mL | Freq: Once | INTRAMUSCULAR | Status: AC | PRN
  Administered 2014-09-04: 50 mL via ORAL
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
## 2014-09-04 NOTE — Progress Notes (Signed)
    Subjective:  Denies CP or dyspnea; continues with hematocheezia   Objective:  Filed Vitals:   09/03/14 0503 09/03/14 1450 09/03/14 2155 09/04/14 0540  BP: 130/65 114/64 128/74 135/63  Pulse: 98 79 65 59  Temp: 98.2 F (36.8 C) 98.3 F (36.8 C) 97.7 F (36.5 C) 97.5 F (36.4 C)  TempSrc: Oral Oral Oral Oral  Resp: 18 19 20 20   Height:      Weight:      SpO2: 98% 98% 99% 99%    Intake/Output from previous day:  Intake/Output Summary (Last 24 hours) at 09/04/14 0738 Last data filed at 09/04/14 0700  Gross per 24 hour  Intake   2940 ml  Output     22 ml  Net   2918 ml    Physical Exam: Physical exam: Well-developed well-nourished in no acute distress.  Skin is warm and dry.  HEENT is normal.  Neck is supple.  Chest is clear to auscultation with normal expansion.  Cardiovascular exam is irregular Abdominal exam nontender or distended. No masses palpated. Extremities show no edema. neuro grossly intact    Lab Results: Basic Metabolic Panel:  Recent Labs  16/08/9609/27/15 1040 09/04/14 0341  NA 133* 138  K 4.3 4.6  CL 95* 100  CO2 24 26  GLUCOSE 119* 171*  BUN 18 15  CREATININE 0.78 0.86  CALCIUM 8.8 8.4   CBC:  Recent Labs  09/02/14 1130  WBC 9.6  HGB 14.0  HCT 40.1  MCV 91.8  PLT 389     Assessment/Plan:  1 Probable permanent atrial fibrillation-patient is assymptomatic; heart rate is controlled on no meds. Not a candidate for anticoagulation given ongoing hematochezia/UC. Would [place] FU [place]. 2 Ulcerative colitis- per GI. 3 CAD-hold on ASA given active GI bleed. We will sign off; please call with questions.  Olga MillersBrian Crenshaw 09/04/2014, 7:38 AM

## 2014-09-04 NOTE — Op Note (Signed)
[facility] [address] [place], [id number]   COLONOSCOPY PROCEDURE REPORT  PATIENT: Ronald Mayo, Ronald M  MR#: [id number] BIRTHDATE: Mar 15, 1941 , 73  yrs. old GENDER: male ENDOSCOPIST: Iva Booparl E Sherard Sutch, MD, [facility] PROCEDURE DATE:  09/04/2014 PROCEDURE:   Colonoscopy with biopsy -Incomplete  ASA CLASS:   Class III INDICATIONS:ulcerative colitis. MEDICATIONS: Fentanyl 50 mcg IV and Versed 2 mg IV  DESCRIPTION OF PROCEDURE:   After the risks benefits and alternatives of the procedure were thoroughly explained, informed consent was obtained.  The digital rectal exam revealed no rectal mass.   The Pentax Colonoscope [id number]  endoscope was introduced through the anus and advanced to the sigmoid colon. No adverse events experienced.   The quality of the prep was good.  The instrument was then slowly withdrawn as the colon was fully examined.      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 Therewere surrounding polypoid changes also. The stricture was not traversable.  Multiple biopsies were performed using cold forceps.   Severe ulcerative colitis and proctitis in other areas examined.  edema, serpiginous ulcers and all confluent with contact friability.  Retroflexion was not performed due to a narrow rectal vault. The time to cecum=not reached     .  Withdrawal time=not recorded     .  The scope was withdrawn and the procedure completed. COMPLICATIONS: There were no immediate complications.  ENDOSCOPIC IMPRESSION: 1.   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   Severe ulcerative colitis and proctitis in other areas examined.  edema, serpiginous ulcers and all confluent with contact friability  RECOMMENDATIONS: 1.  Stop steroids as he likely needs surgery and they will not fix this 2.  CT abd/pelvis  eSigned:   Iva Booparl E Hampton [place], MD, [facility] 09/04/2014 9:54 AM

## 2014-09-04 NOTE — Progress Notes (Signed)
Nutrition Note  Received consult on 10/28 regarding nutrition assessment for possible TPN candidate. Initial nutrition assessment completed on 10/27 with estimated nutrition needs included.   Pt with severe protein calorie malnutrition d/t severe weight loss and sub-optimal nutrition intake for > one month. If TPN initiated, recommend to monitor refeeding labs and replete as warranted  RD to continue to monitor per protocols. Please re-consult as needed. Lloyd HugerSarah F Ticia Virgo MS RD LDN Clinical Dietitian Pager:[phone]

## 2014-09-05 ENCOUNTER — Encounter (HOSPITAL_COMMUNITY): Payer: Self-pay | Admitting: Internal Medicine

## 2014-09-05 LAB — GLUCOSE, CAPILLARY
GLUCOSE-CAPILLARY: 154 mg/dL — AB (ref 70–99)
Glucose-Capillary: 104 mg/dL — ABNORMAL HIGH (ref 70–99)
Glucose-Capillary: 107 mg/dL — ABNORMAL HIGH (ref 70–99)
Glucose-Capillary: 126 mg/dL — ABNORMAL HIGH (ref 70–99)

## 2014-09-05 NOTE — Consult Note (Signed)
Reason for Consult:Ulcerative colitis with stricture Referring Physician: Dr. Silvano Rusk CC:  Bloody diarrhea, unable to eat or drink, progressive weakness.   Ronald Mayo is an 73 y.o. male.  HPI: This is an elderly male with a long history of Ulcerative colitis.  He says Dr. Velora Heckler told him years ago he would need surgery at some point.  He lives alone, is very deaf, and is very hard for him to remember most of his history or hear questions. It appears he has been on and off Asacol for some time.  He has had issues with insurance, and being able to afford medicines, along with some compliance issues.  Over the last 8-9 weeks he has started seeing blood in his stool, and over the last 3-4 weeks it has been very hard for him to eat at all.  He says even water gives him diarrhea.  He has quit taking all his medicines over the last couple weeks because he could not eat.  He was most recently placed on sulfasalazine and prednisone which he felt was not helping and stopped all his medicines.  He presents now with chronic diarrhea, bloody stools, and unable to eat with weight loss.  He was admitted by Dr. Hilarie Fredrickson, on 10/26, and had colonoscopy today.  This shows:  fibrotic, severe and friable stricture in the sigmoid colon. There were surrounding polypoid changes also.  The stricture was not traversable. Multiple biopsies were performed using cold forceps. Severe ulcerative colitis and proctitis in other areas examined. edema, serpiginous ulcers and all confluent with contact friability.  CT scan on 10/28 shows:  Mild to moderate wall thickening involving the sigmoid and rectum. The descending colon is underdistended, without definite inflammation. There is also wall thickening involving the transverse colon. Hyperemia as evidenced by prominence of the Vasa recta. Ascending colon, terminal ileum, and appendix all normal. Normal small bowel without abdominal ascites.  It is Dr. Celesta Aver opinion he will need a  total colectomy.  We are ask to see.     Past Medical History  Diagnosis Date  UC (ulcerative colitis confined to rectum)/Stricture of sigmoid colon   Diabetes mellitus   Tobacco use 30 years up to 2-3 PPD   Non healing LLE ulcer, followed by wound clinic   Body mass index is 29.32 kg/(m^2).    CAD (coronary artery disease)   Personal history of other malignant neoplasm of skin   Atrial fibrillation/not an anticoagulant candidate   Hard of hearing     Past Surgical History  Procedure Laterality Date  . Cardiac stents    . Leg surgeries      Multiple for nonhealing LLE/foot ulcer  . Traumas, umbilical hernia repair    . Colonoscopy N/A 09/04/2014    Procedure: COLONOSCOPY;  Surgeon: Gatha Mayer, MD;  Location: [facility];  Service: Endoscopy;  Laterality: N/A;    Family History  Problem Relation Age of Onset  . Colon cancer Paternal Grandfather     Social History:  reports that he has quit smoking. He has never used smokeless tobacco. He reports that he does not drink alcohol or use illicit drugs. Tobacco:  30 years up to 2-3PPD, none for 30 years ETOH:  Weekend drinker for some years. Drugs:  None Divorced lives alone Allergies: No Known Allergies  Medications:  Prior to Admission:  Prescriptions prior to admission  Medication Sig Dispense Refill  . citalopram (CELEXA) 20 MG tablet Take 20 mg by mouth daily.       Marland Kitchen  fenofibrate (TRICOR) 145 MG tablet Take 145 mg by mouth daily.       Marland Kitchen glimepiride (AMARYL) 4 MG tablet Take 4 mg by mouth daily before breakfast.       . HYDROcodone-acetaminophen (NORCO) 10-325 MG per tablet Take 1 tablet by mouth 3 (three) times daily as needed for severe pain (pain).       . Multiple Vitamin (MULTI VITAMIN DAILY PO) Take 1 tablet by mouth daily.       . pantoprazole (PROTONIX) 40 MG tablet Take 1 tablet (40 mg total) by mouth daily.  30 tablet  11  . sulfaSALAzine (AZULFIDINE) 500 MG tablet Take 2 tablets (1,000 mg total) by  mouth 3 (three) times daily.  180 tablet  5   Scheduled: . antiseptic oral rinse  7 mL Mouth Rinse BID  . citalopram  20 mg Oral Daily  . folic acid  0.5 mg Oral Daily  . insulin aspart  0-15 Units Subcutaneous TID WC  . pantoprazole  40 mg Oral Daily   Continuous: . sodium chloride 100 mL/hr at 09/05/14 1501   NID:[id number], ondansetron (ZOFRAN) IV, ondansetron Anti-infectives   None      Results for orders placed during the hospital encounter of 09/02/14 (from the past 48 hour(s))  GLUCOSE, CAPILLARY     Status: Abnormal   Collection Time    09/03/14  5:01 PM      Result Value Ref Range   Glucose-Capillary 198 (*) 70 - 99 mg/dL  GLUCOSE, CAPILLARY     Status: Abnormal   Collection Time    09/04/14  1:11 AM      Result Value Ref Range   Glucose-Capillary 155 (*) 70 - 99 mg/dL   Comment 1 Documented in Chart     Comment 2 Notify RN    BASIC METABOLIC PANEL     Status: Abnormal   Collection Time    09/04/14  3:41 AM      Result Value Ref Range   Sodium 138  137 - 147 mEq/L   Potassium 4.6  3.7 - 5.3 mEq/L   Chloride 100  96 - 112 mEq/L   CO2 26  19 - 32 mEq/L   Glucose, Bld 171 (*) 70 - 99 mg/dL   BUN 15  6 - 23 mg/dL   Creatinine, Ser 0.86  0.50 - 1.35 mg/dL   Calcium 8.4  8.4 - 10.5 mg/dL   GFR calc non Af Amer 85 (*) >90 mL/min   GFR calc Af Amer >90  >90 mL/min   Comment: (NOTE)     The eGFR has been calculated using the CKD EPI equation.     This calculation has not been validated in all clinical situations.     eGFR's persistently <90 mL/min signify possible Chronic Kidney     Disease.   Anion gap 12  5 - 15  CBC     Status: Abnormal   Collection Time    09/04/14  3:41 AM      Result Value Ref Range   WBC 7.7  4.0 - 10.5 K/uL   RBC 3.80 (*) 4.22 - 5.81 MIL/uL   Hemoglobin 11.7 (*) 13.0 - 17.0 g/dL   HCT 35.7 (*) 39.0 - 52.0 %   MCV 93.9  78.0 - 100.0 fL   MCH 30.8  26.0 - 34.0 pg   MCHC 32.8  30.0 - 36.0 g/dL   RDW 13.6  11.5 - 15.5 %    Platelets 386  150 - 400 K/uL  GLUCOSE, CAPILLARY     Status: Abnormal   Collection Time    09/04/14  7:40 AM      Result Value Ref Range   Glucose-Capillary 181 (*) 70 - 99 mg/dL   Comment 1 Notify RN     Comment 2 Documented in Chart    GLUCOSE, CAPILLARY     Status: Abnormal   Collection Time    09/04/14 11:54 AM      Result Value Ref Range   Glucose-Capillary 119 (*) 70 - 99 mg/dL   Comment 1 Notify RN     Comment 2 Documented in Chart    GLUCOSE, CAPILLARY     Status: Abnormal   Collection Time    09/04/14  5:15 PM      Result Value Ref Range   Glucose-Capillary 109 (*) 70 - 99 mg/dL   Comment 1 Notify RN     Comment 2 Documented in Chart    GLUCOSE, CAPILLARY     Status: Abnormal   Collection Time    09/04/14  9:43 PM      Result Value Ref Range   Glucose-Capillary 115 (*) 70 - 99 mg/dL  GLUCOSE, CAPILLARY     Status: Abnormal   Collection Time    09/05/14  7:44 AM      Result Value Ref Range   Glucose-Capillary 104 (*) 70 - 99 mg/dL  GLUCOSE, CAPILLARY     Status: Abnormal   Collection Time    09/05/14 12:01 PM      Result Value Ref Range   Glucose-Capillary 107 (*) 70 - 99 mg/dL    Ct Abdomen Pelvis W Contrast  09/04/2014   CLINICAL DATA:  Ulcerative colitis with intestinal obstruction K51.912 (ICD-10-CM). 73 year old male with past medical history of diabetes, coronary artery disease, history of atrial fibrillation, history of PE, history of syncope and ulcerative colitis present with exacerbation of ulcerative colitis and found to be in a-fibeval for abd pain and colitis  EXAM: CT ABDOMEN AND PELVIS WITH CONTRAST  TECHNIQUE: Multidetector CT imaging of the abdomen and pelvis was performed using the standard protocol following bolus administration of intravenous contrast.  CONTRAST:  85m OMNIPAQUE IOHEXOL 300 MG/ML SOLN, 102mOMNIPAQUE IOHEXOL 300 MG/ML SOLN  COMPARISON:  Plain films 01/13/2009.  Most recent CT of 03/14/2007  FINDINGS: Lower chest: Clear lung  bases. Mild cardiomegaly. Right coronary artery atherosclerosis. No pericardial or pleural effusion.  Hepatobiliary: Minimal exclusion of the hepatic dome. Mild hepatic steatosis suspected. No focal liver lesion. Focal steatosis adjacent the falciform ligament. Normal gallbladder, without biliary ductal dilatation.  Pancreas: Mild pancreatic atrophy. A tiny cystic focus in the pancreatic body on image 22 of series 2. Alternatively, this could represent interdigitation of peripancreatic fat. Appearance was likely present on the prior exam, suggesting a benign etiology.  Spleen: Normal  Adrenals/Urinary Tract: Normal adrenal glands. Bilateral renal cysts and too small to characterize lesions. No hydronephrosis. Normal ureters and urinary bladder.  Stomach/Bowel: Normal stomach, without wall thickening. Mild to moderate wall thickening involving the sigmoid and rectum. The descending colon is underdistended, without definite inflammation. There is also wall thickening involving the transverse colon. Hyperemia as evidenced by prominence of the Vasa recta. Ascending colon, terminal ileum, and appendix all normal. Normal small bowel without abdominal ascites.  Vascular/Lymphatic: Moderate aortic and branch vessel atherosclerosis. No retroperitoneal or retrocrural adenopathy. No pelvic adenopathy.  Reproductive:  Mild prostatomegaly.  Other: No significant free fluid. Fat containing left inguinal  hernia.  Musculoskeletal: No acute osseous abnormality. 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  IMPRESSION: 1. Left-sided colitis, likely representing active ulcerative colitis. Infectious could look similar. Ischemia felt unlikely, given distribution and clinical history. 2. Atherosclerosis, including within the coronary arteries. 3. Hepatic steatosis.   Electronically Signed   By: Abigail Miyamoto M.D.   On: 09/04/2014 17:06    Review of Systems  Constitutional: Positive for weight  loss. Negative for fever and chills.       His ex wife is here, but she cannot add to history with him in room because she says he will get mad.   He lives alone.  HENT: Negative.   Eyes: Negative.   Respiratory: Positive for cough.   Cardiovascular: Negative.   Gastrointestinal: Positive for diarrhea and blood in stool. Negative for abdominal pain.  Genitourinary: Negative.   Musculoskeletal: Negative.   Skin: Negative.        Non healing LLE/foot ulcer Followed by wound clinic in [place].  Neurological: Positive for weakness. Negative for dizziness.  Endo/Heme/Allergies: Negative.   Psychiatric/Behavioral: Positive for memory loss.   Blood pressure 138/71, pulse 94, temperature 98 F (36.7 C), temperature source Oral, resp. rate 20, height 6' (1.829 m), weight 98.068 kg (216 lb 3.2 oz), SpO2 96.00%. Physical Exam  Constitutional: He is oriented to person, place, and time. No distress.  Edlerly WM NAD, but chronically ill.    HENT:  Head: Normocephalic and atraumatic.  Nose: Nose normal.  He is very hard of hearing.  Eyes: EOM are normal. Pupils are equal, round, and reactive to light. Right eye exhibits no discharge. Left eye exhibits no discharge. No scleral icterus.  Neck: Normal range of motion. Neck supple. No JVD present. No tracheal deviation present. No thyromegaly present.  Cardiovascular: Normal rate, normal heart sounds and intact distal pulses.  Exam reveals no gallop.   No murmur heard. Irregular rhythm   Respiratory: Effort normal and breath sounds normal. No respiratory distress. He has no wheezes. He has no rales. He exhibits no tenderness.  GI: Soft. Bowel sounds are normal. He exhibits no distension and no mass. There is no tenderness. There is no rebound and no guarding.  Musculoskeletal: He exhibits no edema.  Compression stocking on Left leg  Lymphadenopathy:    He has no cervical adenopathy.  Neurological: He is alert and oriented to person, place, and time.  No cranial nerve deficit.  Skin: Skin is warm and dry. No rash noted. He is not diaphoretic. No erythema. No pallor.  Psychiatric: He has a normal mood and affect. His behavior is normal. Judgment and thought content normal.  He cannot remember allot of his past.  He will not let his wife talk, (they are separated) even when he cannot hear.    Assessment/Plan: 1.  Ulcerative colitis with significant stricture 2.  AODM 3.  CAD with prior stents 4. Atrial fibrillation/not an anticoagulant candidate because of Ulcerative colitis 5. Tobacco use, 60 + pack year history, none for 30 years 6.  Non healing LLE/foot wound 7.  Hard of hearing 8.  BMI 29 9.  Deconditioning and probable malnutrition 10.  History of compliance and lives alone.  Plan:  We will wait for biopsies.  If this cannot be reversed medically he will most likely require a colectomy.  He will need medical clearance for this surgery and I would get case management involved.  If he requires an ileostomy this may prove to be a  very difficult post op course, both in and out of the hospital. He does not appear to have much of a support system outside the hospital. Labs in AM.  Dr. Hassell Done will be seeing him tomorrow for our service.   Otisha Spickler 09/05/2014, 3:33 PM

## 2014-09-05 NOTE — Progress Notes (Signed)
Patient ID: Ronald Mayo, male   DOB: Dec 07, 1940, 73 y.o.   MRN: [id number]  [facility] Progress Note  Subjective: Pain meds controlling pain, hasn't had any pos- no Bm since procedure  Ct scan left sided colitis and involving transverse colon, no evidence for malignancy Bx pending  Objective:  Vital signs in last 24 hours: Temp:  [97.5 F (36.4 C)-98.1 F (36.7 C)] 98.1 F (36.7 C) (10/29 0517) Pulse Rate:  [78-88] 88 (10/29 0517) Resp:  [18-20] 18 (10/29 0517) BP: (124-135)/(55-75) 135/75 mmHg (10/29 0517) SpO2:  [97 %-99 %] 99 % (10/29 0517) Last BM Date: 09/05/14 General:   Alert,  Well-developed, older WM   in NAD Heart:  Regular rate and rhythm; no murmurs Pulm;clear Abdomen:  Soft, mildly tender left abdomen,and nondistended. Normal bowel sounds, without guarding, and without rebound.   Extremities:  Without edema. Neurologic:  Alert and  oriented x4;  grossly normal neurologically. Psych:  Alert and cooperative. Normal mood and affect.  Intake/Output from previous day: 10/28 0701 - 10/29 0700 In: 2400 [I.V.:2400] Out: 14 [Urine:6; Stool:8] Intake/Output this shift:    Lab Results:  Recent Labs  09/02/14 1130 09/04/14 0341  WBC 9.6 7.7  HGB 14.0 11.7*  HCT 40.1 35.7*  PLT 389 386   BMET  Recent Labs  09/02/14 1130 09/03/14 1040 09/04/14 0341  NA 132* 133* 138  K 4.2 4.3 4.6  CL 90* 95* 100  CO2 26 24 26   GLUCOSE 159* 119* 171*  BUN 20 18 15   CREATININE 0.99 0.78 0.86  CALCIUM 9.2 8.8 8.4   LFT  Recent Labs  09/02/14 1130  PROT 7.1  ALBUMIN 2.9*  AST 11  ALT 14  ALKPHOS 58  BILITOT 0.4    Assessment / Plan: #1 73 yo male with severe  Ulcerative colitis and stricture- unable to pass scope beyond stricture Bx pending, Ct shows active colitis involving left colon and transverse,no evidence for malignancy Pt will probably need colectomy- await bx, consult surgery, advance diet Steroids have been stopped because of  possible surgery and healing issues    LOS: 3 days   Amy Esterwood  09/05/2014, 11:27 AM   [facility] Attending  I have also seen and assessed the patient and agree with the above note.  Biopsies show ulcer and UC changes - no cancer or dysplasia. It is my sense that medication will not treat this stricture that he has. Cannot dilate either due to perforation risks. Does not seem to be a terrible surgical candidate but definite issues with his health and social situation.  Will see what Dr. Daphine DeutscherMartin thinks. Though he has some increase in surgical risks, surgery (colectomy) should cure the disease.  Iva Booparl E. Ronaldo Crilly, MD, Antionette FairyFACG  Gastroenterology [phone] (pager) 09/05/2014 6:23 PM

## 2014-09-05 NOTE — Progress Notes (Signed)
Quick Note:  Handled at hospital ______

## 2014-09-06 ENCOUNTER — Inpatient Hospital Stay (HOSPITAL_COMMUNITY): Payer: Medicare PPO

## 2014-09-06 LAB — TYPE AND SCREEN
ABO/RH(D): A NEG
Antibody Screen: NEGATIVE

## 2014-09-06 LAB — CBC
HCT: 32.8 % — ABNORMAL LOW (ref 39.0–52.0)
Hemoglobin: 11 g/dL — ABNORMAL LOW (ref 13.0–17.0)
MCH: 31.5 pg (ref 26.0–34.0)
MCHC: 33.5 g/dL (ref 30.0–36.0)
MCV: 94 fL (ref 78.0–100.0)
PLATELETS: 336 10*3/uL (ref 150–400)
RBC: 3.49 MIL/uL — ABNORMAL LOW (ref 4.22–5.81)
RDW: 13.3 % (ref 11.5–15.5)
WBC: 9.2 10*3/uL (ref 4.0–10.5)

## 2014-09-06 LAB — COMPREHENSIVE METABOLIC PANEL
ALBUMIN: 2.3 g/dL — AB (ref 3.5–5.2)
ALT: 16 U/L (ref 0–53)
ANION GAP: 8 (ref 5–15)
AST: 10 U/L (ref 0–37)
Alkaline Phosphatase: 46 U/L (ref 39–117)
BUN: 9 mg/dL (ref 6–23)
CO2: 28 mEq/L (ref 19–32)
CREATININE: 0.82 mg/dL (ref 0.50–1.35)
Calcium: 8.2 mg/dL — ABNORMAL LOW (ref 8.4–10.5)
Chloride: 100 mEq/L (ref 96–112)
GFR calc Af Amer: >90 mL/min (ref >90)
GFR calc non Af Amer: 86 mL/min — ABNORMAL LOW (ref >90)
Glucose, Bld: 130 mg/dL — ABNORMAL HIGH (ref 70–99)
POTASSIUM: 4.1 meq/L (ref 3.7–5.3)
Sodium: 136 mEq/L — ABNORMAL LOW (ref 137–147)
Total Bilirubin: <0.2 mg/dL — ABNORMAL LOW (ref 0.3–1.2)
Total Protein: 5.6 g/dL — ABNORMAL LOW (ref 6.0–8.3)

## 2014-09-06 LAB — ABO/RH: ABO/RH(D): A NEG

## 2014-09-06 LAB — PREALBUMIN: PREALBUMIN: 12.7 mg/dL — AB (ref 17.0–34.0)

## 2014-09-06 LAB — GLUCOSE, CAPILLARY
GLUCOSE-CAPILLARY: 164 mg/dL — AB (ref 70–99)
Glucose-Capillary: 131 mg/dL — ABNORMAL HIGH (ref 70–99)
Glucose-Capillary: 153 mg/dL — ABNORMAL HIGH (ref 70–99)
Glucose-Capillary: 218 mg/dL — ABNORMAL HIGH (ref 70–99)

## 2014-09-06 LAB — MAGNESIUM: Magnesium: 1.9 mg/dL (ref 1.5–2.5)

## 2014-09-06 MED ORDER — SODIUM CHLORIDE 0.9 % IJ SOLN
10.0000 mL | Freq: Two times a day (BID) | INTRAMUSCULAR | Status: DC
Start: 2014-09-06 — End: 2014-10-01
  Administered 2014-09-10 – 2014-09-11 (×2): 10 mL
  Administered 2014-09-12: 20 mL
  Administered 2014-09-12 – 2014-09-28 (×11): 10 mL

## 2014-09-06 MED ORDER — SODIUM CHLORIDE 0.9 % IV SOLN
INTRAVENOUS | Status: DC
  Administered 2014-09-06 – 2014-09-08 (×2): via INTRAVENOUS

## 2014-09-06 MED ORDER — TRACE MINERALS CR-CU-F-FE-I-MN-MO-SE-ZN IV SOLN
INTRAVENOUS | Status: AC
  Administered 2014-09-06: 18:00:00 via INTRAVENOUS
  Filled 2014-09-06: qty 1000

## 2014-09-06 MED ORDER — SODIUM CHLORIDE 0.9 % IJ SOLN
10.0000 mL | INTRAMUSCULAR | Status: DC | PRN
  Administered 2014-09-07: 10 mL
  Administered 2014-09-09: 20 mL
  Administered 2014-09-10 – 2014-09-21 (×5): 10 mL
  Administered 2014-09-22: 20 mL
  Administered 2014-09-22 – 2014-10-01 (×9): 10 mL
  Filled 2014-09-06 (×16): qty 40

## 2014-09-06 MED ORDER — FAT EMULSION 20 % IV EMUL
250.0000 mL | INTRAVENOUS | Status: AC
  Administered 2014-09-06: 250 mL via INTRAVENOUS
  Filled 2014-09-06: qty 250

## 2014-09-06 MED ORDER — INSULIN ASPART 100 UNIT/ML ~~LOC~~ SOLN
0.0000 [IU] | Freq: Three times a day (TID) | SUBCUTANEOUS | Status: DC
  Administered 2014-09-06: 3 [IU] via SUBCUTANEOUS
  Administered 2014-09-06: 5 [IU] via SUBCUTANEOUS
  Administered 2014-09-07: 2 [IU] via SUBCUTANEOUS
  Administered 2014-09-07: 3 [IU] via SUBCUTANEOUS
  Administered 2014-09-07: 2 [IU] via SUBCUTANEOUS
  Administered 2014-09-07: 3 [IU] via SUBCUTANEOUS
  Administered 2014-09-08: 5 [IU] via SUBCUTANEOUS
  Administered 2014-09-08 – 2014-09-09 (×7): 3 [IU] via SUBCUTANEOUS
  Administered 2014-09-10: 4 [IU] via SUBCUTANEOUS
  Administered 2014-09-10: 11 [IU] via SUBCUTANEOUS
  Administered 2014-09-10: 3 [IU] via SUBCUTANEOUS

## 2014-09-06 NOTE — Consult Note (Signed)
Patient seen and examined.  Agree with PA's note. This is a difficult situation given his other comorbid conditions.  Ideally,he would be able to have a total proctocolectomy with a j-pouch.  Realistically, he might be able to tolerate a proctocolectomy and ileostomy depending upon his preoperative cardiac risk evaluation results.  Will wait on path report before discussing definitive surgery.

## 2014-09-06 NOTE — Consult Note (Signed)
[facility] Medical Consultation  Ronald QuarryWilliam M Mayo WUJ:[id number] DOB: 10-03-1941 DOA: 09/02/2014 PCP: Aniceto BossSETTLE,PAUL C, MD   Requesting physician: GI team  Date of consultation: 09/06/2014  Reason for consultation: medical clearance   Impression/Recommendations Active Problems:   Ulcerative colitis - Needs surgery, surgery team following - cleared for intervention from medical standpoint - management per primary team    DM type II - Amaryl on hold for now - agree with SSI in the meantime    Protein-calorie malnutrition, severe  - secondary to progressive nature of UC   Stricture of sigmoid colon   Anemia of chronic disease - Hg and Hct remain stable over the past 24 hours - slight drop in hg since admission likely dilutional from IVF  - CBC in AM  I will followup again tomorrow. Please contact me if I can be of assistance in the meanwhile. Thank you for this consultation.  Debbora PrestoMAGICK-Steele Stracener, MD  [facility] Pager [phone]  If 7PM-7AM, please contact night-coverage [email or web address] Password [email or web address]  Chief Complaint: bloody diarrhea   HPI:  73 year old male with ulcerative colitis on sulfasalazine, DM type II (on Amaryl), presented 10/26 with main concern of several weeks duration or persistent diarrhea and more recently blood in stool. This has been associated with poor oral intake, malaise, weight loss, subjective fevers, chills, intermittent abd cramping, non radiation, with no specific alleviating or aggravating factors. Ct abd and pelvis c/w active UC. GI admitted pt and [facility] consulted for medical clearance in an anticipation of surgical intervention.   Radiological Exams on Admission: Ct Abdomen Pelvis W Contrast 09/04/2014    Left-sided colitis, likely representing active ulcerative colitis. Infectious could look similar. Ischemia felt unlikely, given distribution and clinical history. Atherosclerosis, including within the coronary arteries. Hepatic steatosis.      Review of Systems:  Constitutional: Negative for fever, chills, diaphoresis, activity change, appetite change and fatigue.  HENT: Negative for ear pain, nosebleeds, congestion, facial swelling, rhinorrhea, neck pain, neck stiffness and ear discharge.   Eyes: Negative for pain, discharge, redness, itching and visual disturbance.  Respiratory: Negative for cough, choking, chest tightness, shortness of breath, wheezing and stridor.   Cardiovascular: Negative for chest pain, palpitations and leg swelling.  Gastrointestinal: Negative for abdominal distention.  Genitourinary: Negative for dysuria, urgency, frequency, hematuria, flank pain Musculoskeletal: Negative for back pain, joint swelling, arthralgias and gait problem.  Neurological: Negative for dizziness, tremors, seizures Hematological: Negative for adenopathy. Does not bruise/bleed easily.  Psychiatric/Behavioral: Negative for hallucinations  Past Medical History  Diagnosis Date  . 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 Diabetes mellitus   . CAD (coronary artery disease)   . Personal history of other malignant neoplasm of skin   . Atrial fibrillation   . Stricture of sigmoid colon 09/04/2014   Past Surgical History  Procedure Laterality Date  . Cardiac stents    . Leg surgeries      multiple  . Traumas, umbilical hernia repair    . Colonoscopy N/A 09/04/2014    Procedure: COLONOSCOPY;  Surgeon: Iva Booparl E Gessner, MD;  Location: [facility];  Service: Endoscopy;  Laterality: N/A;   Social History:  reports that he has quit smoking. He has never used smokeless tobacco. He reports that he does not drink alcohol or use illicit drugs.  No Known Allergies Family History  Problem Relation Age of Onset  . Colon cancer Paternal Grandfather     Medication Sig  citalopram (CELEXA) 20 MG tablet Take 20 mg by mouth  daily.   fenofibrate (TRICOR) 145 MG tablet Take 145 mg by mouth daily.   glimepiride (AMARYL) 4 MG tablet Take  4 mg by mouth daily before breakfast.   HYDROcodone-acetaminophen (NORCO) 10-325 MG per tablet Take 1 tablet by mouth 3 (three) times daily as needed for severe pain (pain).   pantoprazole (PROTONIX) 40 MG tablet Take 1 tablet (40 mg total) by mouth daily.  sulfaSALAzine (AZULFIDINE) 500 MG tablet Take 2 tablets (1,000 mg total) by mouth 3 (three) times daily.   Physical Exam: Blood pressure 120/62, pulse 69, temperature 97.7 F (36.5 C), temperature source Oral, resp. rate 18, height 6' (1.829 m), weight 98.068 kg (216 lb 3.2 oz), SpO2 98.00%. Filed Vitals:   09/06/14 1504  BP: 120/62  Pulse: 69  Temp: 97.7 F (36.5 C)  Resp: 18   Physical Exam  Constitutional: Appears well-developed and well-nourished. No distress.  HENT: Normocephalic. External right and left ear normal. Oropharynx is clear and moist.  Eyes: Conjunctivae and EOM are normal. PERRLA, no scleral icterus.  Neck: Normal ROM. Neck supple. No JVD. No tracheal deviation. No thyromegaly.  CVS: RRR, S1/S2 +, no murmurs, no gallops, no carotid bruit.  Pulmonary: Effort and breath sounds normal, no stridor, rhonchi, wheezes, rales.  Abdominal: Soft. BS +,  no distension, tenderness, rebound or guarding.  Musculoskeletal: Normal range of motion. No edema and no tenderness.  Lymphadenopathy: No lymphadenopathy noted, cervical, inguinal. Neuro: Alert. Normal reflexes, muscle tone coordination. No cranial nerve deficit. Skin: Skin is warm and dry. No rash noted. Not diaphoretic. No erythema. No pallor.  Psychiatric: Normal mood and affect. Behavior, judgment, thought content normal.   Labs on Admission:  Basic Metabolic Panel:  Recent Labs Lab 09/02/14 1130 09/03/14 1040 09/04/14 0341 09/06/14 0105  NA 132* 133* 138 136*  K 4.2 4.3 4.6 4.1  CL 90* 95* 100 100  CO2 26 24 26 28   GLUCOSE 159* 119* 171* 130*  BUN 20 18 15 9   CREATININE 0.99 0.78 0.86 0.82  CALCIUM 9.2 8.8 8.4 8.2*  MG  --   --   --  1.9   Liver  Function Tests:  Recent Labs Lab 09/02/14 1130 09/06/14 0105  AST 11 10  ALT 14 16  ALKPHOS 58 46  BILITOT 0.4 <0.2*  PROT 7.1 5.6*  ALBUMIN 2.9* 2.3*   CBC:  Recent Labs Lab 09/02/14 1130 09/04/14 0341 09/06/14 0105  WBC 9.6 7.7 9.2  HGB 14.0 11.7* 11.0*  HCT 40.1 35.7* 32.8*  MCV 91.8 93.9 94.0  PLT 389 386 336   CBG:  Recent Labs Lab 09/05/14 1201 09/05/14 1703 09/05/14 2151 09/06/14 0738 09/06/14 1151  GLUCAP 107* 154* 126* 131* 153*   EKG: not done   Time spent: 45 minutes   MAGICK-Dayanara Sherrill [facility] Pager [phone]  If 7PM-7AM, please contact night-coverage [email or web address] Password [email or web address] 09/06/2014, 3:59 PM

## 2014-09-06 NOTE — Progress Notes (Signed)
2 Days Post-Op  Subjective: Stable, he says everytime you push on his stomach he squirts from his bottom and it is bloody.  In pretty good spirits.  Objective: Vital signs in last 24 hours: Temp:  [97.4 F (36.3 C)-98.7 F (37.1 C)] 97.4 F (36.3 C) (10/30 1001) Pulse Rate:  [77-103] 77 (10/30 1001) Resp:  [18-20] 18 (10/30 1001) BP: (120-138)/(54-71) 120/54 mmHg (10/30 1001) SpO2:  [96 %-99 %] 98 % (10/30 1001) Last BM Date: 09/05/14 6+00 PO recorded 5 stools recorded AFEBRILE,VSS,  LABS ok, pREALBUMIN 12.7  Intake/Output from previous day: 10/29 0701 - 10/30 0700 In: 2971.7 [P.O.:600; I.V.:2371.7] Out: 3 [Urine:3] Intake/Output this shift: Total I/O In: 320 [P.O.:320] Out: -   General appearance: alert, cooperative and no distress GI: soft, non-tender; bowel sounds normal; no masses,  no organomegaly and having bloody loose watery fluid from the rectum.  Lab Results:   Recent Labs  09/04/14 0341 09/06/14 0105  WBC 7.7 9.2  HGB 11.7* 11.0*  HCT 35.7* 32.8*  PLT 386 336    BMET  Recent Labs  09/04/14 0341 09/06/14 0105  NA 138 136*  K 4.6 4.1  CL 100 100  CO2 26 28  GLUCOSE 171* 130*  BUN 15 9  CREATININE 0.86 0.82  CALCIUM 8.4 8.2*   PT/INR No results found for this basename: LABPROT, INR,  in the last 72 hours   Recent Labs Lab 09/02/14 1130 09/06/14 0105  AST 11 10  ALT 14 16  ALKPHOS 58 46  BILITOT 0.4 <0.2*  PROT 7.1 5.6*  ALBUMIN 2.9* 2.3*     Lipase     Component Value Date/Time   LIPASE 22.0 03/03/2007 0941     Studies/Results: Ct Abdomen Pelvis W Contrast  09/04/2014   CLINICAL DATA:  Ulcerative colitis with intestinal obstruction K51.912 (ICD-10-CM). 73 year old male with past medical history of diabetes, coronary artery disease, history of atrial fibrillation, history of PE, history of syncope and ulcerative colitis present with exacerbation of ulcerative colitis and found to be in a-fibeval for abd pain and colitis  EXAM:  CT ABDOMEN AND PELVIS WITH CONTRAST  TECHNIQUE: Multidetector CT imaging of the abdomen and pelvis was performed using the standard protocol following bolus administration of intravenous contrast.  CONTRAST:  50mL OMNIPAQUE IOHEXOL 300 MG/ML SOLN, 100mL OMNIPAQUE IOHEXOL 300 MG/ML SOLN  COMPARISON:  Plain films 01/13/2009.  Most recent CT of 03/14/2007  FINDINGS: Lower chest: Clear lung bases. Mild cardiomegaly. Right coronary artery atherosclerosis. No pericardial or pleural effusion.  Hepatobiliary: Minimal exclusion of the hepatic dome. Mild hepatic steatosis suspected. No focal liver lesion. Focal steatosis adjacent the falciform ligament. Normal gallbladder, without biliary ductal dilatation.  Pancreas: Mild pancreatic atrophy. A tiny cystic focus in the pancreatic body on image 22 of series 2. Alternatively, this could represent interdigitation of peripancreatic fat. Appearance was likely present on the prior exam, suggesting a benign etiology.  Spleen: Normal  Adrenals/Urinary Tract: Normal adrenal glands. Bilateral renal cysts and too small to characterize lesions. No hydronephrosis. Normal ureters and urinary bladder.  Stomach/Bowel: Normal stomach, without wall thickening. Mild to moderate wall thickening involving the sigmoid and rectum. The descending colon is underdistended, without definite inflammation. There is also wall thickening involving the transverse colon. Hyperemia as evidenced by prominence of the Vasa recta. Ascending colon, terminal ileum, and appendix all normal. Normal small bowel without abdominal ascites.  Vascular/Lymphatic: Moderate aortic and branch vessel atherosclerosis. No retroperitoneal or retrocrural adenopathy. No pelvic adenopathy.  Reproductive:  Mild prostatomegaly.  Other: No significant free fluid. Fat containing left inguinal hernia.  Musculoskeletal: No acute osseous abnormality. 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   IMPRESSION: 1. Left-sided colitis, likely representing active ulcerative colitis. Infectious could look similar. Ischemia felt unlikely, given distribution and clinical history. 2. Atherosclerosis, including within the coronary arteries. 3. Hepatic steatosis.   Electronically Signed   By: Jeronimo GreavesKyle  Talbot M.D.   On: 09/04/2014 17:06    Medications: . antiseptic oral rinse  7 mL Mouth Rinse BID  . citalopram  20 mg Oral Daily  . folic acid  0.5 mg Oral Daily  . insulin aspart  0-15 Units Subcutaneous TID WC  . pantoprazole  40 mg Oral Daily   1. Colon, biopsy, stricture in sigmoid colon - ULCERATED COLONIC MUCOSA. - NO GRANULOMAS, DYSPLASIA OR MALIGNANCY. 2. Colon, biopsy, sigmoid colon stricture bx - ULCERATED COLONIC MUCOSA. - NO GRANULOMAS, DYSPLASIA OR MALIGNANCY. Assessment/Plan 1. Ulcerative colitis with significant stricture  2. AODM  3. CAD with prior stents  4. Atrial fibrillation/not an anticoagulant candidate because of Ulcerative colitis  5. Tobacco use, 60 + pack year history, none for 30 years  6. Non healing LLE/foot wound  7. Hard of hearing  8. BMI 29  9. Deconditioning and malnutrition (PREALBUMIN 12.7) 10. History of compliance and lives alone   Plan:  I think we need to have medicine see and clear for surgery.  I am going to order, PICC and TNA.  Begin planning for post hospital care.  Wound care nurse to see and mark for ileostomy.  I will discuss with Dr. Daphine DeutscherMartin.  LOS: 4 days    Diva Lemberger 09/06/2014

## 2014-09-06 NOTE — Progress Notes (Signed)
PARENTERAL NUTRITION CONSULT NOTE - INITIAL  Pharmacy Consult for TPN Indication: Severe ulcerative colitis and malnutrition  No Known Allergies  Patient Measurements: Height: 6' (182.9 cm) Weight: 216 lb 3.2 oz (98.068 kg) IBW/kg (Calculated) : 77.6 Adjusted Body Weight:  Usual Weight: ~ 246lbs (30 lb wt loss over 3 months)  Vital Signs: Temp: 97.4 F (36.3 C) (10/30 1001) Temp Source: Oral (10/30 1001) BP: 120/54 mmHg (10/30 1001) Pulse Rate: 77 (10/30 1001) Intake/Output from previous day: 10/29 0701 - 10/30 0700 In: 2971.7 [P.O.:600; I.V.:2371.7] Out: 3 [Urine:3] Intake/Output from this shift: Total I/O In: 320 [P.O.:320] Out: -   Labs:  Recent Labs  09/04/14 0341 09/06/14 0105  WBC 7.7 9.2  HGB 11.7* 11.0*  HCT 35.7* 32.8*  PLT 386 336     Recent Labs  09/04/14 0341 09/06/14 0105  NA 138 136*  K 4.6 4.1  CL 100 100  CO2 26 28  GLUCOSE 171* 130*  BUN 15 9  CREATININE 0.86 0.82  CALCIUM 8.4 8.2*  MG  --  1.9  PROT  --  5.6*  ALBUMIN  --  2.3*  AST  --  10  ALT  --  16  ALKPHOS  --  46  BILITOT  --  <0.2*  PREALBUMIN  --  12.7*   Estimated Creatinine Clearance: 98.8 ml/min (by C-G formula based on Cr of 0.82).    Recent Labs  09/05/14 2151 09/06/14 0738 09/06/14 1151  GLUCAP 126* 131* 153*    Medical History: Past Medical History  Diagnosis Date  . 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 Diabetes mellitus   . CAD (coronary artery disease)   . Personal history of other malignant neoplasm of skin   . Atrial fibrillation   . Stricture of sigmoid colon 09/04/2014   Insulin Requirements:  8 units Novolog SSI/24h  Current Nutrition:  fulls to Soft diet - RD following and eating ~50% of full liquid meals  IVF: NS at 16500ml/hr  Central access:  PICC to be placed 10/30  TPN start date: 10/30  ASSESSMENT                                                                                                          HPI: [id number]  with known h/o ulcerative colitis.  Colonoscopy reveals severe UC and proctitis with friable strictures.  Biopsies consistent with UC and not malignancy.  General surgery consulted and plan is for proctolcolectomy and J-pouch.  Pharmacy asked to start TPN to improve nutritional status for surgery. History of diabetes on glimepiride prior to admission.   Significant events:   Today:    Glucose - CBGs 126-153 on novolog SSI  Electrolytes - Na = 136, Corr Ca WNL  Renal - SCr WNL  LFTs - WNL  TGs -order for tom am  Prealbumin - 12.7(10/30)  NUTRITIONAL GOALS  RD recs: 2200-2400 kcals, 115-130gm protein Clinimix 5/15 at a goal rate of 13800ml/hr + 20% fat emulsion at 4910ml/hr to provide: 120g/day protein, 2184Kcal/day. **Note** currently patient eating ~50% of full liquids and RD adding magic cup and diet advanced to soft diet 10/30  PLAN                                                                                                                         At 1800 today:  Start Clinimix E5/15 at 6240ml/hr.  Based on current CBGs and SSI use, will add 10 units regular insulin to tonight's TPN  20% fat emulsion at 5310ml/hr.  Plan to advance as tolerated. Per discussion with RD 10/30, currently does not need TPN at goal rate as patient is doing fairly well with PO intake  TNA to contain standard multivitamins and trace elements.  Reduce IVF to 960ml/hr.  Since advanced to soft diet, keep SSI/CBGs TIDwc, add hs coverage  TNA lab panels on Mondays & Thursdays.  F/u daily.  Juliette Alcideustin Zeigler, PharmD, BCPS.   Pager: [phone]  09/06/2014,2:11 PM

## 2014-09-06 NOTE — Evaluation (Signed)
Physical Therapy Evaluation Patient Details Name: Ronald QuarryWilliam M Ravenscroft MRN: [id number] DOB: [birth year] Today's Date: 09/06/2014   History of Present Illness  73 y.o. male with h/o ulcerative colitis, DM, CAD, a fib, chronic diarrhea, chronic LLE wound admitted with bloody stools, abdominal pain and significant weight loss. Dx of colon stricture.   Clinical Impression  **Pt admitted with stricture of sigmoid colon**. Pt currently with functional limitations due to the deficits listed below (see PT Problem List).  Pt will benefit from skilled PT to increase their independence and safety with mobility to allow discharge to the venue listed below.   Pt ambulated 160' without assistive device without loss of balance, though he fatigued quickly. He would benefit from walking in halls 2-3x/day to build up his activity tolerance. Will follow acutely to progress mobility.   *    Follow Up Recommendations No PT follow up    Equipment Recommendations  None recommended by PT    Recommendations for Other Services       Precautions / Restrictions Precautions Precautions: Fall -pt reports no falls in past few years Precaution Comments: chronic wound L heel, pt wears compression stocking Restrictions Weight Bearing Restrictions: No      Mobility  Bed Mobility Overal bed mobility: Modified Independent             General bed mobility comments: with rail, HOB elevated  Transfers Overall transfer level: Independent                  Ambulation/Gait Ambulation/Gait assistance: Supervision Ambulation Distance (Feet): 160 Feet Assistive device: None Gait Pattern/deviations: Step-through pattern;Decreased step length - right;Decreased step length - left;Decreased stride length   Gait velocity interpretation: Below normal speed for age/gender General Gait Details: decreased velocity, no LOB while walking, distance limited by fatigue, pt stated he hasn't walked much in past 4 days so  feels a bit weaker than baseline  Stairs            Wheelchair Mobility    Modified Rankin (Stroke Patients Only)       Balance Overall balance assessment: Needs assistance   Sitting balance-Leahy Scale: Normal Sitting balance - Comments: able to don socks and shoes independently sitting on EOB     Standing balance-Leahy Scale: Good                               Pertinent Vitals/Pain Pain Assessment: No/denies pain    Home Living Family/patient expects to be discharged to:: Private residence Living Arrangements: Alone Available Help at Discharge: Family;Available PRN/intermittently Type of Home: House Home Access: Stairs to enter Entrance Stairs-Rails: None Entrance Stairs-Number of Steps: 2 Home Layout: One level Home Equipment: Cane - single point      Prior Function Level of Independence: Independent with assistive device(s)         Comments: used cane intermittently     Hand Dominance        Extremity/Trunk Assessment   Upper Extremity Assessment: Overall WFL for tasks assessed           Lower Extremity Assessment: Overall WFL for tasks assessed      Cervical / Trunk Assessment: Normal  Communication   Communication: HOH  Cognition Arousal/Alertness: Awake/alert Behavior During Therapy: WFL for tasks assessed/performed Overall Cognitive Status: Within Functional Limits for tasks assessed  General Comments      Exercises        Assessment/Plan    PT Assessment Patient needs continued PT services  PT Diagnosis Generalized weakness   PT Problem List Decreased activity tolerance;Decreased balance;Decreased skin integrity  PT Treatment Interventions Gait training;Functional mobility training;Therapeutic activities;Therapeutic exercise;Balance training   PT Goals (Current goals can be found in the Care Plan section) Acute Rehab PT Goals Patient Stated Goal: return to baseline PT Goal  Formulation: With patient Time For Goal Achievement: 09/16/14 Potential to Achieve Goals: Good    Frequency Min 3X/week   Barriers to discharge        Co-evaluation               End of Session Equipment Utilized During Treatment: Gait belt Activity Tolerance: Patient limited by fatigue Patient left: in bed;with call bell/phone within reach;with family/visitor present Nurse Communication: Mobility status         Time: [id number] PT Time Calculation (min): 17 min   Charges:   PT Evaluation $Initial PT Evaluation Tier I: 1 Procedure PT Treatments $Gait Training: 8-22 mins   PT G Codes:          Tamala SerUhlenberg, Lucrezia Dehne Kistler 09/06/2014, 1:38 PM

## 2014-09-06 NOTE — Consult Note (Addendum)
WOC requested for preoperative stoma site marking  Discussed surgical procedure and stoma creation with patient, no family at bedside.  Pt is quite hard of hearing but appears to understand my conversation with him.   Explained role of the WOC nurse team.  Provided the patient with educational booklet.  Answered patient questions.   Examined patient lying, sitting, and standing in order to place the marking in the patient's visual field, away from any creases or abdominal contour issues and within the rectus muscle.  Was not able to mark below patients beltline and still have this patient be able to visualize his stoma.     Marked for ileostomy in the RLQ  6cm cm to the right of the umbilicus and  2cm  cm above the umbilicus.    Covered mark with thin film transparent dressing to preserve mark until date of surgery  WOC team will follow up with patient after surgery for continue ostomy care and teaching.  Aisia Correira WalterhillAustin RN,CWOCN [phone]

## 2014-09-06 NOTE — Progress Notes (Addendum)
NUTRITION FOLLOW UP  Intervention:   -Addendum: TPN per pharmacy -Recommend MagicCup supplement BID, each supplement provides 290 kcal, 9 gram protein -Monitor diet education needs as diet advancement tolerated -RD to continue to follow  Nutrition Dx:   Inadequate oral intake related to nausea/abd pain as evidenced by PO intake < 75% for 2 months, 30 lb weight loss in 3 months; improving    Goal:   Pt to meet >/= 90% of their estimated nutrition needs; progressing    Monitor:   Diet order, total protein/energy intake, labs, weights, GI profile, education needs  Assessment:   10/27: sigmoid colon were found.  -Pt reported decreased appetite since 07/2014. Noted feelings of nausea, abd pain and loose stools that have inhibited intake  -Pt did not provide specific food items on diet recall, reported that he consumed small amount of "junk food" during the day as he was not able to tolerate full meals. Has tried Ensure/Boost drinks before, but was unable to tolerate  -Endorsed weight loss. Reported usual body weight of 250 lb; indicating approximately 35 lb weight loss in 3 months (14% body weight loss, severe for time frame)  -Pt NPO upon admit, and diet advanced to clear liquids. Pt had not yet received lunch tray during RD assessment.  -Continues with loose bloody stools. C.diff negative. Plan to undergo colonoscopy for further eval  -Had questions regarding appropriate diet for colitis/UC, requested RD follow up when diet advanced to solid foods  -Would likely benefit from addition of low fiber nutrition supplement-Carnation Instant Breakfast for nutrient replenishment when diet advanced to [facility] or solid  10/30: -Diet advanced to Full liquid diet, consuming approximately 50% of meals. Pt reported tolerating pudding, ice cream, chicken broth and [place] ices. Abd pain/nausea improved post meals -MD noted biopsy indicates UC, no malignancy. Will need surgery, likely proctocolectomy with  j-pouch vs ileostomy  -Pt declining supplements at this time; however was willing to trial MagicCup supplements -Declined nutrition education at this time, will monitor needs -Encouraged protein sources as tolerated for weight management and muscle maintenance  10/30: -Received consult for TPN -Discussed pt with pharmacy; noted that TPN likely initiated to improve nutritional status prior to surgery -Pt no longer poses severe refeeding risk as he has improved PO intake for past several days during admit. K WNL -Will continue to monitor supplement tolerance (magiccup) and protein intake; pt advanced to soft diet after RD follow up and now has more options available vs liquid diets -TPN does not likely need to meet >90% of estimated nutrition needs as pt with fair appetite(50% PO + poss supplement) and want to prevent decreasing appetite by meeting kcal needs with TPN   Height: Ht Readings from Last 1 Encounters:  09/02/14 6' (1.829 m)    Weight Status:   Wt Readings from Last 1 Encounters:  09/02/14 216 lb 3.2 oz (98.068 kg)    Re-estimated needs:  Kcal: 2200-2400  Protein: 115-130 gram  Fluid: >/= 2200 ml daily    Skin: WDL  Diet Order: Parke SimmersBland - (diet advanced after pt was followed up on)   Intake/Output Summary (Last 24 hours) at 09/06/14 1151 Last data filed at 09/06/14 0900  Gross per 24 hour  Intake 3291.67 ml  Output      3 ml  Net 3288.67 ml    Last BM: 10/29   Labs:   Recent Labs Lab 09/03/14 1040 09/04/14 0341 09/06/14 0105  NA 133* 138 136*  K 4.3 4.6 4.1  CL 95* 100 100  CO2 24 26 28   BUN 18 15 9   CREATININE 0.78 0.86 0.82  CALCIUM 8.8 8.4 8.2*  MG  --   --  1.9  GLUCOSE 119* 171* 130*    CBG (last 3)   Recent Labs  09/05/14 1703 09/05/14 2151 09/06/14 0738  GLUCAP 154* 126* 131*    Scheduled Meds: . antiseptic oral rinse  7 mL Mouth Rinse BID  . citalopram  20 mg Oral Daily  . folic acid  0.5 mg Oral Daily  . insulin aspart  0-15  Units Subcutaneous TID WC  . pantoprazole  40 mg Oral Daily    Continuous Infusions: . sodium chloride 100 mL/hr at 09/06/14 0121    Lloyd HugerSarah F Phelix Fudala MS RD LDN Clinical Dietitian Pager:[phone]

## 2014-09-06 NOTE — Progress Notes (Signed)
Peripherally Inserted Central Catheter/Midline Placement  The IV Nurse has discussed with the patient and/or persons authorized to consent for the patient, the purpose of this procedure and the potential benefits and risks involved with this procedure.  The benefits include less needle sticks, lab draws from the catheter and patient may be discharged home with the catheter.  Risks include, but not limited to, infection, bleeding, blood clot (thrombus formation), and puncture of an artery; nerve damage and irregular heat beat.  Alternatives to this procedure were also discussed.  PICC/Midline Placement Documentation  PICC / Midline Double Lumen 09/06/14 PICC Right Basilic 40 cm 0 cm (Active)  Indication for Insertion or Continuance of Line Administration of hyperosmolar/irritating solutions (i.e. TPN, Vancomycin, etc.) 09/06/2014  3:43 PM  Exposed Catheter (cm) 0 cm 09/06/2014  3:43 PM  Lumen #1 Status Flushed;Saline locked;Blood return noted 09/06/2014  3:43 PM  Lumen #2 Status Flushed;Saline locked;Blood return noted 09/06/2014  3:43 PM  Dressing Change Due 09/13/14 09/06/2014  3:43 PM       Ethelda Chickurrie, Tauren Delbuono Robert 09/06/2014, 3:45 PM

## 2014-09-06 NOTE — Progress Notes (Signed)
Patient ID: Lorenso QuarryWilliam M Lepp, male   DOB: 30-Jan-1941, 73 y.o.   MRN: [id number]  [facility] Progress Note  Subjective: Pain controlled fairly well- some diarrhea and blood this am, eating solid food Colon Bx-and bx of stricture- both consistent with ulcerative colitis- no malignancy  Objective:  Vital signs in last 24 hours: Temp:  [97.4 F (36.3 C)-98.7 F (37.1 C)] 97.4 F (36.3 C) (10/30 1001) Pulse Rate:  [77-103] 77 (10/30 1001) Resp:  [18-20] 18 (10/30 1001) BP: (120-138)/(54-71) 120/54 mmHg (10/30 1001) SpO2:  [96 %-99 %] 98 % (10/30 1001) Last BM Date: 09/05/14 General:   Alert,  Well-developed,  Older WM  in NAD Heart:  Regular rate and rhythm; no murmurs Pulm;claer Abdomen:  Soft, tender left abdomen, some guarding and nondistended. Normal bowel sounds, without guarding, and without rebound.   Extremities:  Without edema. Neurologic:  Alert and  oriented x4;  grossly normal neurologically. Psych:  Alert and cooperative. Normal mood and affect.  Intake/Output from previous day: 10/29 0701 - 10/30 0700 In: 2971.7 [P.O.:600; I.V.:2371.7] Out: 3 [Urine:3] Intake/Output this shift: Total I/O In: 320 [P.O.:320] Out: -   Lab Results:  Recent Labs  09/04/14 0341 09/06/14 0105  WBC 7.7 9.2  HGB 11.7* 11.0*  HCT 35.7* 32.8*  PLT 386 336   BMET  Recent Labs  09/04/14 0341 09/06/14 0105  NA 138 136*  K 4.6 4.1  CL 100 100  CO2 26 28  GLUCOSE 171* 130*  BUN 15 9  CREATININE 0.86 0.82  CALCIUM 8.4 8.2*   LFT  Recent Labs  09/06/14 0105  PROT 5.6*  ALBUMIN 2.3*  AST 10  ALT 16  ALKPHOS 46  BILITOT <0.2*      Assessment / Plan: #1  73 yo WM with horrible UC with tight sigmoid stricture- no malignancy by CT or BX. Needs surgery Steroids have been stopped Continue current regimen-Dr. Daphine DeutscherMartin to see today Will ask medicine to see for medical clearance Question of TNA has come up-pre-albumin 12.6 Active Problems:   Ulcerative  colitis   Protein-calorie malnutrition, severe   Stricture of sigmoid colon     LOS: 4 days   Amy Esterwood  09/06/2014, 11:21 AM

## 2014-09-07 LAB — COMPREHENSIVE METABOLIC PANEL
ALT: 13 U/L (ref 0–53)
AST: 9 U/L (ref 0–37)
Albumin: 2.2 g/dL — ABNORMAL LOW (ref 3.5–5.2)
Alkaline Phosphatase: 43 U/L (ref 39–117)
Anion gap: 9 (ref 5–15)
BUN: 7 mg/dL (ref 6–23)
CALCIUM: 8.3 mg/dL — AB (ref 8.4–10.5)
CO2: 28 meq/L (ref 19–32)
Chloride: 100 mEq/L (ref 96–112)
Creatinine, Ser: 0.68 mg/dL (ref 0.50–1.35)
GLUCOSE: 145 mg/dL — AB (ref 70–99)
Potassium: 3.7 mEq/L (ref 3.7–5.3)
SODIUM: 137 meq/L (ref 137–147)
Total Bilirubin: <0.2 mg/dL — ABNORMAL LOW (ref 0.3–1.2)
Total Protein: 5.3 g/dL — ABNORMAL LOW (ref 6.0–8.3)

## 2014-09-07 LAB — TRIGLYCERIDES: Triglycerides: 112 mg/dL (ref <150)

## 2014-09-07 LAB — DIFFERENTIAL
Basophils Absolute: 0 10*3/uL (ref 0.0–0.1)
Basophils Relative: 0 % (ref 0–1)
EOS ABS: 0.6 10*3/uL (ref 0.0–0.7)
EOS PCT: 7 % — AB (ref 0–5)
Lymphocytes Relative: 20 % (ref 12–46)
Lymphs Abs: 1.7 10*3/uL (ref 0.7–4.0)
MONO ABS: 1.2 10*3/uL — AB (ref 0.1–1.0)
Monocytes Relative: 14 % — ABNORMAL HIGH (ref 3–12)
Neutro Abs: 5 10*3/uL (ref 1.7–7.7)
Neutrophils Relative %: 59 % (ref 43–77)

## 2014-09-07 LAB — CBC
HEMATOCRIT: 31.8 % — AB (ref 39.0–52.0)
HEMOGLOBIN: 10.6 g/dL — AB (ref 13.0–17.0)
MCH: 31.5 pg (ref 26.0–34.0)
MCHC: 33.3 g/dL (ref 30.0–36.0)
MCV: 94.4 fL (ref 78.0–100.0)
Platelets: 352 10*3/uL (ref 150–400)
RBC: 3.37 MIL/uL — ABNORMAL LOW (ref 4.22–5.81)
RDW: 13.4 % (ref 11.5–15.5)
WBC: 8.5 10*3/uL (ref 4.0–10.5)

## 2014-09-07 LAB — GLUCOSE, CAPILLARY
GLUCOSE-CAPILLARY: 147 mg/dL — AB (ref 70–99)
GLUCOSE-CAPILLARY: 155 mg/dL — AB (ref 70–99)
Glucose-Capillary: 172 mg/dL — ABNORMAL HIGH (ref 70–99)
Glucose-Capillary: 184 mg/dL — ABNORMAL HIGH (ref 70–99)

## 2014-09-07 LAB — PHOSPHORUS: PHOSPHORUS: 3.2 mg/dL (ref 2.3–4.6)

## 2014-09-07 LAB — MAGNESIUM: Magnesium: 1.8 mg/dL (ref 1.5–2.5)

## 2014-09-07 MED ORDER — FAT EMULSION 20 % IV EMUL
250.0000 mL | INTRAVENOUS | Status: AC
  Administered 2014-09-07: 250 mL via INTRAVENOUS
  Filled 2014-09-07: qty 250

## 2014-09-07 MED ORDER — M.V.I. ADULT IV INJ
INTRAVENOUS | Status: AC
  Administered 2014-09-07: 18:00:00 via INTRAVENOUS
  Filled 2014-09-07: qty 1000

## 2014-09-07 NOTE — Progress Notes (Addendum)
Brief HPI: 73 year old male with ulcerative colitis on sulfasalazine, DM type II (on Amaryl), presented 10/26 with main concern of several weeks duration or persistent diarrhea and more recently blood in stool. This has been associated with poor oral intake, malaise, weight loss, subjective fevers, chills, intermittent abd cramping, non radiation, with no specific alleviating or aggravating factors. Ct abd and pelvis c/w active UC. GI admitted pt and [facility] consulted for medical clearance in an anticipation of surgical intervention.   Pt cleared for surgery from IM standpoint. Will sign off for now but please call us if needed post op.   Debbora PrestoMAGICK-Radames Mejorado, MD  [facility] Pager [phone] Cell [phone]  If 7PM-7AM, please contact night-coverage [email or web address] Password [email or web address]

## 2014-09-07 NOTE — Progress Notes (Signed)
General Surgery Note  LOS: 5 days  POD -  3 Days Post-Op GI - [facility] ([facility])  Assessment/Plan: 1.  Ulcerative colitis  Sigmoid stricture - on colonoscopy by Dr. Leone PayorGessner - 09/04/2014 [note that this stricture was documented in 2011 on a colonoscopy attempt]  Patient marked for surgery!  He is hard to talk to because of his hearing (or lack thereof).  2.  Anemia - Hgb - 10/6 - 09/07/2014 3.  Malnutrition - Prealbumin- 12.7  On TNA 4.  DM 5.  CAD 6.  History of A. Fib 7.  Very hard of hearing  8.  DVT prophylaxis - on no chemoprophylaxis due to UC   Active Problems:   Ulcerative colitis   Protein-calorie malnutrition, severe   Stricture of sigmoid colon  Subjective:  Seems to understand what is going on.  He has had ulcerative colitis for unknown number of years.  He has been seen by Dr. Eloise HarmanPaterson in the past (around 2008).  Most recently he has been followed by Dr. [place]n Cotaehman in [place].  His wife visits him, though they are separated.  He has two children that live in [place].  He is closer to his daughter, Irish EldersKelly Walker.  Objective:   Filed Vitals:   09/07/14 0503  BP: 142/56  Pulse: 88  Temp: 97.9 F (36.6 C)  Resp: 16     Intake/Output from previous day:  10/30 0701 - 10/31 0700 In: 2913 [P.O.:440; I.V.:1868; TPN:605] Out: -   Intake/Output this shift:      Physical Exam:   General: WN older WM who is alert and oriented.    HEENT: Normal. Pupils equal. .   Lungs: Clear.   Abdomen: Soft.  He told me he had not pain, then he told the nurse he was having some pain.   Lab Results:    Recent Labs  09/06/14 0105 09/07/14 0355  WBC 9.2 8.5  HGB 11.0* 10.6*  HCT 32.8* 31.8*  PLT 336 352    BMET   Recent Labs  09/06/14 0105 09/07/14 0355  NA 136* 137  K 4.1 3.7  CL 100 100  CO2 28 28  GLUCOSE 130* 145*  BUN 9 7  CREATININE 0.82 0.68  CALCIUM 8.2* 8.3*    PT/INR  No results found for this basename: LABPROT, INR,  in the last 72  hours  ABG  No results found for this basename: PHART, PCO2, PO2, HCO3,  in the last 72 hours   Studies/Results:  Dg Chest Port 1 View  09/06/2014   CLINICAL DATA:  73 year old male status post PICC line placement. Initial encounter.  EXAM: PORTABLE CHEST - 1 VIEW  COMPARISON:  CT Abdomen and Pelvis 09/04/2014.  FINDINGS: Portable AP semi upright view at 1553 hrs. Right side PICC line placed, tip projects just below the carina. Normal cardiac size and mediastinal contours. Visualized tracheal air column is within normal limits. No pneumothorax. Somewhat low lung volumes. Mild increased interstitial markings, stable. Otherwise Allowing for portable technique, the lungs are clear.  IMPRESSION: 1. Right PICC line placed, tip at the SVC level. 2.  No acute cardiopulmonary abnormality.   Electronically Signed   By: Augusto GambleLee  Hall M.D.   On: 09/06/2014 16:21     Anti-infectives:   Anti-infectives   None      Ovidio Kinavid Eliyohu Class, MD, FACS Pager: [phone] [facility] Office: [phone] 09/07/2014

## 2014-09-07 NOTE — Progress Notes (Signed)
Patient ID: Ronald Mayo, male   DOB: 1941-02-09, 73 y.o.   MRN: [id number]  [facility] Progress Note  Subjective: Feeling ok- eating well. TNA started yesterday Pain about the same-controlled  Objective:  Vital signs in last 24 hours: Temp:  [97.4 F (36.3 C)-97.9 F (36.6 C)] 97.9 F (36.6 C) (10/31 0503) Pulse Rate:  [69-88] 88 (10/31 0503) Resp:  [16-18] 16 (10/31 0503) BP: (120-142)/(54-64) 142/56 mmHg (10/31 0503) SpO2:  [98 %-99 %] 98 % (10/31 0503) Last BM Date: 09/07/14 General:   Alert,  Well-developed, older WM    in NAD Heart:  Regular rate and rhythm; no murmurs Pulm;clear Abdomen:  Soft, mildly tender and nondistended. Normal bowel sounds, without guarding, and without rebound.   Extremities:  Without edema- wound on LLE dressed. Neurologic:  Alert and  oriented x4;  grossly normal neurologically. Psych:  Alert and cooperative. Normal mood and affect.  Intake/Output from previous day: 10/30 0701 - 10/31 0700 In: 2913 [P.O.:440; I.V.:1868; TPN:605] Out: -  Intake/Output this shift:    Lab Results:  Recent Labs  09/06/14 0105 09/07/14 0355  WBC 9.2 8.5  HGB 11.0* 10.6*  HCT 32.8* 31.8*  PLT 336 352   BMET  Recent Labs  09/06/14 0105 09/07/14 0355  NA 136* 137  K 4.1 3.7  CL 100 100  CO2 28 28  GLUCOSE 130* 145*  BUN 9 7  CREATININE 0.82 0.68  CALCIUM 8.2* 8.3*   LFT  Recent Labs  09/07/14 0355  PROT 5.3*  ALBUMIN 2.2*  AST 9  ALT 13  ALKPHOS 43  BILITOT <0.2*    Assessment / Plan: #1 73 yo male with horrible UC with tight sigmoid stricture- plan is for surgery this admit Cleared from medicine standpoint #2 anemia- secondary to blood loss - has not required transfusions #3 Protein/calorie malnutrition- TNA started #4 AODM #5 CAD #6 hx Afib  No changes from GI standpoint      LOS: 5 days   Amy Esterwood  09/07/2014, 9:04 AM   [facility] Attending  I have also seen and assessed the patient and agree  with the above note.This was done on 09/07/2014  Iva Booparl E. Gessner, MD, [facility]

## 2014-09-07 NOTE — Progress Notes (Signed)
PARENTERAL NUTRITION CONSULT NOTE   Pharmacy Consult for TPN Indication: Severe ulcerative colitis and malnutrition  No Known Allergies  Patient Measurements: Height: 6' (182.9 cm) Weight: 216 lb 3.2 oz (98.068 kg) IBW/kg (Calculated) : 77.6 Adjusted Body Weight:  Usual Weight: ~ 246lbs (30 lb wt loss over 3 months)  Vital Signs: Temp: 97.9 F (36.6 C) (10/31 0503) Temp Source: Oral (10/31 0503) BP: 142/56 mmHg (10/31 0503) Pulse Rate: 88 (10/31 0503) Intake/Output from previous day: 10/30 0701 - 10/31 0700 In: 2913 [P.O.:440; I.V.:1868; TPN:605] Out: -  Intake/Output from this shift:   Labs:  Recent Labs  09/06/14 0105 09/07/14 0355  WBC 9.2 8.5  HGB 11.0* 10.6*  HCT 32.8* 31.8*  PLT 336 352    Recent Labs  09/06/14 0105 09/07/14 0355  NA 136* 137  K 4.1 3.7  CL 100 100  CO2 28 28  GLUCOSE 130* 145*  BUN 9 7  CREATININE 0.82 0.68  CALCIUM 8.2* 8.3*  MG 1.9 1.8  PHOS  --  3.2  PROT 5.6* 5.3*  ALBUMIN 2.3* 2.2*  AST 10 9  ALT 16 13  ALKPHOS 46 43  BILITOT <0.2* <0.2*  PREALBUMIN 12.7*  --    Estimated Creatinine Clearance: 101.3 ml/min (by C-G formula based on Cr of 0.68).    Recent Labs  09/06/14 1151 09/06/14 1743 09/06/14 2152  GLUCAP 153* 164* 218*   Medical History: Past Medical History  Diagnosis Date  . 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 Diabetes mellitus   . CAD (coronary artery disease)   . Personal history of other malignant neoplasm of skin   . Atrial fibrillation   . Stricture of sigmoid colon 09/04/2014   Insulin Requirements:  8 units/24 hr: moderate Novolog correction scale ac & hs  Current Nutrition:  Soft diet - RD following and eating >50% of meals  IVF: NS at 1660ml/hr  Central access:  PICC placed 10/30  TPN start date: 10/30  ASSESSMENT                                                                                                          HPI: [id number] [id number] with known h/o ulcerative colitis.   Colonoscopy reveals severe UC and proctitis with friable strictures.  Biopsies consistent with UC and not malignancy. General surgery consulted and plan is for proctocolectomy and J-pouch.  Pharmacy asked to start TPN to improve nutritional status for surgery. History of diabetes on glimepiride prior to admission, glimepiride held, using Novolog correction scale  Significant events:  10/30: TNA started at 40 ml/hr  Today:   Glucose - CBGs 164, 218, & serum glucose 145, on novolog SSI  Electrolytes - Na = 136, Corr Ca 9.3  Renal - SCr WNL  LFTs - WNL  TGs -pending  Prealbumin - 12.7(10/30)  NUTRITIONAL GOALS  RD recs: 2200-2400 kcals, 115-130gm protein Clinimix 5/15 at a goal rate of 16200ml/hr + 20% fat emulsion at 8010ml/hr to provide: 120g/day protein, 2184Kcal/day. **Note** currently patient eating ~50% of full liquids and RD adding magic cup and diet advanced to soft diet 10/30  PLAN                                                                                                                         At 1800 today:  Continue Clinimix E5/15 at 3340ml/hr.  Based on current CBGs and SSI use, will add 10 units regular insulin to TPN  20% fat emulsion at 3410ml/hr.  Plan to advance as tolerated. Per discussion with RD 10/30, currently does not need TPN at goal rate as patient is doing fairly well with PO intake  Intake this am: > 75% of tray  TNA to contain standard multivitamins and trace elements.  Continue IVF at 7060ml/hr.  Since advanced to soft diet, keep SSI/CBGs TIDwc, add hs coverage  TNA lab panels on Mondays & Thursdays.  F/u daily.  Otho BellowsGreen, Kendrick Remigio L PharmD Pager [phone] 09/07/2014, 9:47 AM

## 2014-09-08 DIAGNOSIS — K51912 Ulcerative colitis, unspecified with intestinal obstruction: Principal | ICD-10-CM

## 2014-09-08 LAB — GLUCOSE, CAPILLARY
Glucose-Capillary: 188 mg/dL — ABNORMAL HIGH (ref 70–99)
Glucose-Capillary: 163 mg/dL — ABNORMAL HIGH (ref 70–99)
Glucose-Capillary: 157 mg/dL — ABNORMAL HIGH (ref 70–99)

## 2014-09-08 LAB — CLOSTRIDIUM DIFFICILE BY PCR: CDIFFPCR: NEGATIVE

## 2014-09-08 MED ORDER — INSULIN GLARGINE 100 UNIT/ML ~~LOC~~ SOLN
10.0000 [IU] | Freq: Two times a day (BID) | SUBCUTANEOUS | Status: DC
  Administered 2014-09-08 (×2): 10 [IU] via SUBCUTANEOUS
  Filled 2014-09-08 (×3): qty 0.1

## 2014-09-08 MED ORDER — FAT EMULSION 20 % IV EMUL
250.0000 mL | INTRAVENOUS | Status: AC
  Administered 2014-09-08: 250 mL via INTRAVENOUS
  Filled 2014-09-08: qty 250

## 2014-09-08 MED ORDER — SACCHAROMYCES BOULARDII 250 MG PO CAPS
250.0000 mg | ORAL_CAPSULE | Freq: Two times a day (BID) | ORAL | Status: DC
  Administered 2014-09-08 – 2014-09-09 (×4): 250 mg via ORAL
  Filled 2014-09-08 (×6): qty 1

## 2014-09-08 MED ORDER — LOPERAMIDE HCL 2 MG PO CAPS
2.0000 mg | ORAL_CAPSULE | ORAL | Status: DC | PRN
  Administered 2014-09-08 – 2014-09-09 (×2): 2 mg via ORAL
  Filled 2014-09-08 (×2): qty 1

## 2014-09-08 MED ORDER — SODIUM CHLORIDE 0.9 % IV SOLN
INTRAVENOUS | Status: AC
  Administered 2014-09-09: 20:00:00 via INTRAVENOUS

## 2014-09-08 MED ORDER — TRACE MINERALS CR-CU-F-FE-I-MN-MO-SE-ZN IV SOLN
INTRAVENOUS | Status: AC
  Administered 2014-09-08: 18:00:00 via INTRAVENOUS
  Filled 2014-09-08: qty 2000

## 2014-09-08 NOTE — Progress Notes (Signed)
Patient ID: Ronald QuarryWilliam M Wentz, male   DOB: 05-04-1941, 73 y.o.   MRN: [id number]  [facility] Progress Note  Subjective: Frustrated-up most of night with watery diarrhea- small amts of blood . His pain is about the same, not much appetite On TNA  Objective:  Vital signs in last 24 hours: Temp:  [98 F (36.7 C)-98.1 F (36.7 C)] 98.1 F (36.7 C) (11/01 0512) Pulse Rate:  [91-98] 91 (11/01 0512) Resp:  [16-18] 16 (11/01 0512) BP: (120-124)/(39-58) 120/39 mmHg (11/01 0512) SpO2:  [97 %-98 %] 98 % (11/01 0512) Last BM Date: 09/07/14 General:   Alert,  Well-developed, older WM   in NAD. HOH Heart:  Regular rate and rhythm; no murmurs Pulm;clear Abdomen:  Soft, tender and nondistended.  bowel sounds somewhat hyperactive, without guarding, and without rebound.   Extremities:  Without edema. Neurologic:  Alert and  oriented x4;  grossly normal neurologically. Psych:  Alert and cooperative. Normal mood and affect.  Lab Results:  Recent Labs  09/06/14 0105 09/07/14 0355  WBC 9.2 8.5  HGB 11.0* 10.6*  HCT 32.8* 31.8*  PLT 336 352   BMET  Recent Labs  09/06/14 0105 09/07/14 0355  NA 136* 137  K 4.1 3.7  CL 100 100  CO2 28 28  GLUCOSE 130* 145*  BUN 9 7  CREATININE 0.82 0.68  CALCIUM 8.2* 8.3*   LFT  Recent Labs  09/07/14 0355  PROT 5.3*  ALBUMIN 2.2*  AST 9  ALT 13  ALKPHOS 43  BILITOT <0.2*     Assessment / Plan: #1  73 yo male with horrible UC with tight sigmoid stricture- on no RX currently as plan is for colectomy this  Coming week  #2 malnutrition - On TNA, and solid diet #3  Abrupt worsening of diarrhea past 24 hours- stool for cdiff neg on admit- will repeat,and add Florastor . Will start prn Imodium today- if cdiff positive may want to stop #4 hx Atrial Fib #5 AODM     LOS: 6 days   Amy Esterwood  09/08/2014, 9:16 AM   [facility] Attending  I have also seen and assessed the patient and agree with the above note.  Getting pre-op  nutritional support. Worsening diarrhea - probably his UC. Check C diff. Hope its not that  Iva Booparl E. Fancy Dunkley, MD, [facility] [phone]) 09/08/2014 2:03 PM

## 2014-09-08 NOTE — Progress Notes (Signed)
Spoke with daughter of patient regarding his procedure, told as of now we have no orders for surgery in the am. Will continue to monitor pt through out the night.

## 2014-09-08 NOTE — Progress Notes (Signed)
PARENTERAL NUTRITION CONSULT NOTE   Pharmacy Consult for TPN Indication: Severe ulcerative colitis and malnutrition  No Known Allergies  Patient Measurements: Height: 6' (182.9 cm) Weight: 216 lb 3.2 oz (98.068 kg) IBW/kg (Calculated) : 77.6 Adjusted Body Weight:  Usual Weight: ~ 246lbs (30 lb wt loss over 3 months)  Vital Signs: Temp: 98.1 F (36.7 C) (11/01 0512) Temp Source: Oral (11/01 0512) BP: 120/39 mmHg (11/01 0512) Pulse Rate: 91 (11/01 0512) Intake/Output from previous day: 10/31 0701 - 11/01 0700 In: 3660 [P.O.:1020; I.V.:1440; TPN:1200] Out: -  Intake/Output from this shift:   Labs:  Recent Labs  09/06/14 0105 09/07/14 0355  WBC 9.2 8.5  HGB 11.0* 10.6*  HCT 32.8* 31.8*  PLT 336 352    Recent Labs  09/06/14 0105 09/07/14 0355  NA 136* 137  K 4.1 3.7  CL 100 100  CO2 28 28  GLUCOSE 130* 145*  BUN 9 7  CREATININE 0.82 0.68  CALCIUM 8.2* 8.3*  MG 1.9 1.8  PHOS  --  3.2  PROT 5.6* 5.3*  ALBUMIN 2.3* 2.2*  AST 10 9  ALT 16 13  ALKPHOS 46 43  BILITOT <0.2* <0.2*  PREALBUMIN 12.7*  --   TRIG  --  112   Estimated Creatinine Clearance: 101.3 mL/min (by C-G formula based on Cr of 0.68).    Recent Labs  09/07/14 1147 09/07/14 1649 09/07/14 2128  GLUCAP 155* 172* 184*   Medical History: Past Medical History  Diagnosis Date  . 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 Diabetes mellitus   . CAD (coronary artery disease)   . Personal history of other malignant neoplasm of skin   . Atrial fibrillation   . Stricture of sigmoid colon 09/04/2014   Insulin Requirements:  9 units/24 hr: moderate Novolog correction scale ac & hs Novolin 10 units/liter  Current Nutrition:  Soft diet - RD following and eating <50% of meals today Intake good 10/31, but increase of diarrhea has blunted appetite  IVF: NS at 2760ml/hr  Central access:  PICC placed 10/30  TPN start date: 10/30  ASSESSMENT                                                                                                           HPI: [id number] YOM with known h/o ulcerative colitis.  Colonoscopy reveals severe UC and proctitis with friable strictures.  Biopsies consistent with UC and not malignancy. General surgery consulted and plan is for proctocolectomy and J-pouch.  Pharmacy asked to start TPN to improve nutritional status for surgery. History of diabetes on glimepiride prior to admission, glimepiride held, using Novolog correction scale  Significant events:  10/30: TNA started at 40 ml/hr 11/1: advance TNA to 60 m/hr  Today:   Glucose - CBGs 172, 184 yesterday, serum glucose 145 this am  Electrolytes - Na = 136, Corr Ca 9.3  Renal - SCr WNL  LFTs - WNL  TGs -112  Prealbumin - 12.7(10/30)  NUTRITIONAL GOALS  RD recs: 2200-2400 kcals, 115-130gm protein Clinimix 5/15 at a goal rate of 18900ml/hr + 20% fat emulsion at 4810ml/hr to provide: 120g/day protein, 2184Kcal/day.  RD adding magic cup and diet advanced to soft diet 10/30  PLAN                                                                                                                         At 1800 today:  Increase Clinimix E5/15 to 3560ml/hr.  Based on current CBGs and SSI use, will change to Lantus 10 units SQ bid (using a 2L TNA bag, difficult to ensure accurate delivery of desired insulin)   20% fat emulsion at 5610ml/hr.  Plan to advance as tolerated. Per discussion with RD 10/30, may not need TPN at goal rate based on PO intake  Intake this am: < 25% of tray  TNA to contain standard multivitamins and trace elements.  Reduce IVF to 6140ml/hr.  Since advanced to soft diet, keep SSI/CBGs TIDwc, add hs coverage  TNA lab panels on Mondays & Thursdays.  F/u daily.  Otho BellowsGreen, Brycen Bean L PharmD Pager [phone] 09/08/2014, 10:00 AM

## 2014-09-09 LAB — GLUCOSE, CAPILLARY
GLUCOSE-CAPILLARY: 202 mg/dL — AB (ref 70–99)
GLUCOSE-CAPILLARY: 177 mg/dL — AB (ref 70–99)
GLUCOSE-CAPILLARY: 185 mg/dL — AB (ref 70–99)
Glucose-Capillary: 186 mg/dL — ABNORMAL HIGH (ref 70–99)
Glucose-Capillary: 155 mg/dL — ABNORMAL HIGH (ref 70–99)

## 2014-09-09 LAB — CBC
HCT: 31.4 % — ABNORMAL LOW (ref 39.0–52.0)
HEMOGLOBIN: 10.6 g/dL — AB (ref 13.0–17.0)
MCH: 31.5 pg (ref 26.0–34.0)
MCHC: 33.8 g/dL (ref 30.0–36.0)
MCV: 93.5 fL (ref 78.0–100.0)
Platelets: 375 10*3/uL (ref 150–400)
RBC: 3.36 MIL/uL — AB (ref 4.22–5.81)
RDW: 13.7 % (ref 11.5–15.5)
WBC: 9.6 10*3/uL (ref 4.0–10.5)

## 2014-09-09 LAB — DIFFERENTIAL
BASOS PCT: 0 % (ref 0–1)
Basophils Absolute: 0 10*3/uL (ref 0.0–0.1)
Eosinophils Absolute: 0.7 10*3/uL (ref 0.0–0.7)
Eosinophils Relative: 7 % — ABNORMAL HIGH (ref 0–5)
LYMPHS PCT: 19 % (ref 12–46)
Lymphs Abs: 1.8 10*3/uL (ref 0.7–4.0)
MONO ABS: 1.5 10*3/uL — AB (ref 0.1–1.0)
Monocytes Relative: 16 % — ABNORMAL HIGH (ref 3–12)
NEUTROS ABS: 5.6 10*3/uL (ref 1.7–7.7)
Neutrophils Relative %: 58 % (ref 43–77)

## 2014-09-09 LAB — COMPREHENSIVE METABOLIC PANEL
ALT: 11 U/L (ref 0–53)
AST: 10 U/L (ref 0–37)
Albumin: 2 g/dL — ABNORMAL LOW (ref 3.5–5.2)
Alkaline Phosphatase: 44 U/L (ref 39–117)
Anion gap: 10 (ref 5–15)
BUN: 9 mg/dL (ref 6–23)
CHLORIDE: 98 meq/L (ref 96–112)
CO2: 29 meq/L (ref 19–32)
Calcium: 8.5 mg/dL (ref 8.4–10.5)
Creatinine, Ser: 0.65 mg/dL (ref 0.50–1.35)
GFR calc Af Amer: >90 mL/min (ref >90)
GFR calc non Af Amer: >90 mL/min (ref >90)
Glucose, Bld: 185 mg/dL — ABNORMAL HIGH (ref 70–99)
POTASSIUM: 4.2 meq/L (ref 3.7–5.3)
SODIUM: 137 meq/L (ref 137–147)
Total Protein: 5.4 g/dL — ABNORMAL LOW (ref 6.0–8.3)

## 2014-09-09 LAB — MAGNESIUM: MAGNESIUM: 1.9 mg/dL (ref 1.5–2.5)

## 2014-09-09 LAB — PHOSPHORUS: Phosphorus: 3.2 mg/dL (ref 2.3–4.6)

## 2014-09-09 LAB — TRIGLYCERIDES: TRIGLYCERIDES: 96 mg/dL (ref <150)

## 2014-09-09 MED ORDER — TRACE MINERALS CR-CU-F-FE-I-MN-MO-SE-ZN IV SOLN
INTRAVENOUS | Status: AC
  Administered 2014-09-09: 18:00:00 via INTRAVENOUS
  Filled 2014-09-09: qty 2000

## 2014-09-09 MED ORDER — ALTEPLASE 2 MG IJ SOLR
2.0000 mg | Freq: Once | INTRAMUSCULAR | Status: AC
Start: 2014-09-09 — End: 2014-09-09
  Administered 2014-09-09: 2 mg
  Filled 2014-09-09: qty 2

## 2014-09-09 MED ORDER — FAT EMULSION 20 % IV EMUL
250.0000 mL | INTRAVENOUS | Status: AC
  Administered 2014-09-09: 250 mL via INTRAVENOUS
  Administered 2014-09-10: 10 mL via INTRAVENOUS
  Filled 2014-09-09: qty 250

## 2014-09-09 MED ORDER — INSULIN GLARGINE 100 UNIT/ML ~~LOC~~ SOLN
15.0000 [IU] | Freq: Two times a day (BID) | SUBCUTANEOUS | Status: DC
Start: 2014-09-09 — End: 2014-09-10
  Administered 2014-09-09 (×2): 15 [IU] via SUBCUTANEOUS
  Filled 2014-09-09 (×3): qty 0.15

## 2014-09-09 MED ORDER — DEXTROSE 5 % IV SOLN
2.0000 g | INTRAVENOUS | Status: AC
  Administered 2014-09-10: 2 g via INTRAVENOUS
  Filled 2014-09-09: qty 2

## 2014-09-09 NOTE — Progress Notes (Signed)
5 Days Post-Op  Subjective: He has had a rough weekend with allot of loose stools.  He has been marked for his ileostomy.  They are currently checking his stool for C diff colitis. If there are no issues we plan surgery in the AM.    Objective: Vital signs in last 24 hours: Temp:  [97.9 F (36.6 C)-98.7 F (37.1 C)] 97.9 F (36.6 C) (11/02 0505) Pulse Rate:  [85-93] 88 (11/02 0505) Resp:  [18-24] 20 (11/02 0505) BP: (112-124)/(56-58) 118/56 mmHg (11/02 0505) SpO2:  [97 %-98 %] 98 % (11/02 0505) Last BM Date: 09/09/14 8 BM's recorded yesterday. Afebrile, VSS Labs OK No films Nutrition:  Soft diet/TNA   Intake/Output from previous day: 11/01 0701 - 11/02 0700 In: 1260 [P.O.:120; I.V.:240; TPN:900] Out: -  Intake/Output this shift:    General appearance: alert, cooperative and no distress GI: soft, non-tender; bowel sounds normal; no masses,  no organomegaly and having loose uncontrolled stools.   Stable H/H.  Lab Results:   Recent Labs  09/07/14 0355 09/09/14 0651  WBC 8.5 9.6  HGB 10.6* 10.6*  HCT 31.8* 31.4*  PLT 352 375    BMET  Recent Labs  09/07/14 0355 09/09/14 0651  NA 137 137  K 3.7 4.2  CL 100 98  CO2 28 29  GLUCOSE 145* 185*  BUN 7 9  CREATININE 0.68 0.65  CALCIUM 8.3* 8.5   PT/INR No results for input(s): LABPROT, INR in the last 72 hours.   Recent Labs Lab 09/02/14 1130 09/06/14 0105 09/07/14 0355 09/09/14 0651  AST 11 10 9 10   ALT 14 16 13 11   ALKPHOS 58 46 43 44  BILITOT 0.4 <0.2* <0.2* <0.2*  PROT 7.1 5.6* 5.3* 5.4*  ALBUMIN 2.9* 2.3* 2.2* 2.0*     Lipase     Component Value Date/Time   LIPASE 22.0 03/03/2007 0941     Studies/Results: No results found.  Medications: . antiseptic oral rinse  7 mL Mouth Rinse BID  . citalopram  20 mg Oral Daily  . folic acid  0.5 mg Oral Daily  . insulin aspart  0-15 Units Subcutaneous TID WC & HS  . insulin glargine  15 Units Subcutaneous BID  . pantoprazole  40 mg Oral Daily  .  saccharomyces boulardii  250 mg Oral BID  . sodium chloride  10-40 mL Intracatheter Q12H   . sodium chloride    . Marland Kitchen.TPN (CLINIMIX-E) Adult 60 mL/hr at 09/08/14 1752   And  . fat emulsion 250 mL (09/08/14 1752)  . Marland Kitchen.TPN (CLINIMIX-E) Adult     And  . fat emulsion     Prior to Admission medications   Medication Sig Start Date End Date Taking? Authorizing Provider  citalopram (CELEXA) 20 MG tablet Take 20 mg by mouth daily.  12/26/13  Yes Historical Provider, MD  fenofibrate (TRICOR) 145 MG tablet Take 145 mg by mouth daily.  01/31/12  Yes Historical Provider, MD  glimepiride (AMARYL) 4 MG tablet Take 4 mg by mouth daily before breakfast.  03/14/12  Yes Historical Provider, MD  HYDROcodone-acetaminophen (NORCO) 10-325 MG per tablet Take 1 tablet by mouth 3 (three) times daily as needed for severe pain (pain).    Yes Historical Provider, MD  Multiple Vitamin (MULTI VITAMIN DAILY PO) Take 1 tablet by mouth daily.    Yes Historical Provider, MD  pantoprazole (PROTONIX) 40 MG tablet Take 1 tablet (40 mg total) by mouth daily. 08/13/14  Yes Malissa HippoNajeeb U Rehman, MD  sulfaSALAzine (  AZULFIDINE) 500 MG tablet Take 2 tablets (1,000 mg total) by mouth 3 (three) times daily. 08/13/14  Yes Malissa HippoNajeeb U Rehman, MD     Assessment/Plan 1. 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 Leone PayorGessner - 09/04/2014 [note that this stricture was documented in 2011 on a colonoscopy attempt] Patient marked for surgery! He is hard to talk to because of his hearing (or lack thereof). 2. Anemia - Hgb - 10/6 - 09/07/2014 3. Malnutrition - Prealbumin- 12.7 On TNA 4. DM 5. CAD 6. History of A. Fib rate in the 80's. 7. Very hard of hearing 8. DVT prophylaxis - on no chemoprophylaxis due to UC  Plan:  He is scheduled for surgery tomorrow.  C diff was negative.  He has been cleared for surgery from Medicine. He ws seen by Cardiology and it was Dr. Ludwig Clarksrenshaw's opinion  he was in permanent atrial fibrillation.  He is asymptomatic and not a candidate for anticoagulation because of UC.  They have signed off for now. He as typed on 09/06/14.  Will check coagulation studies in AM.  He has been marked for his ileostomy      LOS: 7 days    Quadre Bristol 09/09/2014

## 2014-09-09 NOTE — Anesthesia Preprocedure Evaluation (Addendum)
Anesthesia Evaluation  Patient identified by MRN, date of birth, ID band Patient awake    Reviewed: Allergy & Precautions, H&P , NPO status , Patient's Chart, lab work & pertinent test results  Airway Mallampati: II  TM Distance: >3 FB Neck ROM: Full    Dental no notable dental hx.    Pulmonary former smoker,  breath sounds clear to auscultation  Pulmonary exam normal       Cardiovascular + CAD Rhythm:Regular Rate:Normal     Neuro/Psych Anxiety Depression negative neurological ROS     GI/Hepatic Neg liver ROS, GERD-  Medicated and Controlled,  Endo/Other  negative endocrine ROSdiabetes, Type 2, Oral Hypoglycemic Agents  Renal/GU negative Renal ROS  negative genitourinary   Musculoskeletal negative musculoskeletal ROS (+)   Abdominal   Peds negative pediatric ROS (+)  Hematology negative hematology ROS (+)   Anesthesia Other Findings   Reproductive/Obstetrics negative OB ROS                            Anesthesia Physical Anesthesia Plan  ASA: III  Anesthesia Plan: General   Post-op Pain Management:    Induction: Intravenous  Airway Management Planned: Oral ETT  Additional Equipment:   Intra-op Plan:   Post-operative Plan: Extubation in OR  Informed Consent: I have reviewed the patients History and Physical, chart, labs and discussed the procedure including the risks, benefits and alternatives for the proposed anesthesia with the patient or authorized representative who has indicated his/her understanding and acceptance.   Dental advisory given  Plan Discussed with: CRNA  Anesthesia Plan Comments:         Anesthesia Quick Evaluation

## 2014-09-09 NOTE — Progress Notes (Signed)
PT Cancellation Note  Patient Details Name: Ronald Mayo MRN: [id number] DOB: 1941-03-12   Cancelled Treatment:    Reason Eval/Treat Not Completed: Pain limiting ability to participate. Pt has had pain medication which has not provided relief. RN notified.  Pt also having frequent BMs. PT will follow.   Ralene BatheUhlenberg, Krystian Ferrentino Kistler 09/09/2014, 10:37 AM  [phone]

## 2014-09-09 NOTE — Progress Notes (Signed)
CARE MANAGEMENT NOTE 09/09/2014  Patient:  Ronald Mayo,Ronald Mayo   Account Number:  [id number]  Date Initiated:  09/03/2014  Documentation initiated by:  Lanier ClamMAHABIR,Jmichael Gille  Subjective/Objective Assessment:   73 Y/O Mayo ADMITTED W/ULCERATIVE COLITIS.WU:[id number]:ULCERATIVE COLITIS.     Action/Plan:   FROM HOME.   Anticipated DC Date:  09/12/2014   Anticipated DC Plan:  HOME/SELF CARE      DC Planning Services  CM consult      Choice offered to / List presented to:             Status of service:  In process, will continue to follow Medicare Important Message given?  YES (If response is "NO", the following Medicare IM given date fields will be blank) Date Medicare IM given:  09/09/2014 Medicare IM given by:  [facility],Jigar Zielke Date Additional Medicare IM given:   Additional Medicare IM given by:    Discharge Disposition:    Per UR Regulation:  Reviewed for med. necessity/level of care/duration of stay  If discussed at Long Length of Stay Meetings, dates discussed:    Comments:  09/09/14 Jonothan Heberle RN,BSN NCM [phone] FOR COLECTOMY/END ILEOSTOMY IN AM.TNA.NO ANTICIPATED D/C NEEDS.  09/03/14 [facility] Kaly Mcquary RN, BSN, NCM [phone] MONITOR PROGRESS.NO ANTICIPATED D/C NEEDS.

## 2014-09-09 NOTE — Progress Notes (Signed)
    Progress Note   Subjective  doesn't feel too much improved since being admitted. Still having diarrhea, abdominal pain.    Objective   Vital signs in last 24 hours: Temp:  [97.9 F (36.6 C)-98.7 F (37.1 C)] 97.9 F (36.6 C) (11/02 0505) Pulse Rate:  [85-93] 88 (11/02 0505) Resp:  [18-24] 20 (11/02 0505) BP: (112-124)/(56-58) 118/56 mmHg (11/02 0505) SpO2:  [97 %-98 %] 98 % (11/02 0505) Last BM Date: 09/09/14 General:    white male in NAD. He is hard of hearing Heart:  Regular rate irregular rhythm Lungs: Respirations even and unlabored, lungs CTA bilaterally Abdomen:  Soft,  Nondistended, diffusely tender, hypoactive bowel sounds. Extremities:  Without edema. Neurologic:  Alert and oriented,  grossly normal neurologically. Psych:  Cooperative. Normal mood and affect.  Lab Results:  Recent Labs  09/07/14 0355 09/09/14 0651  WBC 8.5 9.6  HGB 10.6* 10.6*  HCT 31.8* 31.4*  PLT 352 375   BMET  Recent Labs  09/07/14 0355 09/09/14 0651  NA 137 137  K 3.7 4.2  CL 100 98  CO2 28 29  GLUCOSE 145* 185*  BUN 7 9  CREATININE 0.68 0.65  CALCIUM 8.3* 8.5   LFT  Recent Labs  09/09/14 0651  PROT 5.4*  ALBUMIN 2.0*  AST 10  ALT 11  ALKPHOS 44  BILITOT <0.2*      Assessment / Plan:   841. [age] year old male with severe UC with tight sigmoid stricture. For surgery this week (so not on steroids). Internal Medicine cleared him for surgery. He had an increase in diarrhea over last day but C-diff still negative.WBC normal, hgb holding at 10.6. Electrolytes on CMET normal.   2. Protein calorie malnutrition, getting TNA  3. Diabetes. Glucose 185. Getting SSI. Oral diabetic med on hold  4. Afib (on telemetry) but rate in 80's    LOS: 7 days   Willette Clusteraula Guenther  09/09/2014, 10:37 AM

## 2014-09-09 NOTE — Progress Notes (Signed)
NUTRITION FOLLOW UP  Intervention:   -TPN per pharmacy; advance to goal as tolerated d/t decreased appetite and ongoing loose stools -Recommend MagicCup supplement BID, each supplement provides 290 kcal, 9 gram protein -Monitor diet/ileostomy education needs as diet advancement tolerated -RD to continue to follow  Nutrition Dx:   Inadequate oral intake related to nausea/abd pain as evidenced by PO intake < 75% for 2 months, 30 lb weight loss in 3 months; progressing  Goal:   Pt to meet >/= 90% of their estimated nutrition needs; progressing    Monitor:   TPN tolerance, total protein/energy intake, labs, weights, GI profile, education needs  Assessment:   10/27: sigmoid colon were found.  -Pt reported decreased appetite since 07/2014. Noted feelings of nausea, abd pain and loose stools that have inhibited intake  -Pt did not provide specific food items on diet recall, reported that he consumed small amount of "junk food" during the day as he was not able to tolerate full meals. Has tried Ensure/Boost drinks before, but was unable to tolerate  -Endorsed weight loss. Reported usual body weight of 250 lb; indicating approximately 35 lb weight loss in 3 months (14% body weight loss, severe for time frame)  -Pt NPO upon admit, and diet advanced to clear liquids. Pt had not yet received lunch tray during RD assessment.  -Continues with loose bloody stools. C.diff negative. Plan to undergo colonoscopy for further eval  -Had questions regarding appropriate diet for colitis/UC, requested RD follow up when diet advanced to solid foods  -Would likely benefit from addition of low fiber nutrition supplement-Carnation Instant Breakfast for nutrient replenishment when diet advanced to [facility] or solid  10/30: -Diet advanced to Full liquid diet, consuming approximately 50% of meals. Pt reported tolerating pudding, ice cream, chicken broth and [place]. Abd pain/nausea improved post meals -MD noted biopsy  indicates UC, no malignancy. Will need surgery, likely proctocolectomy with j-pouch vs ileostomy  -Pt declining supplements at this time; however was willing to trial MagicCup supplements -Declined nutrition education at this time, will monitor needs -Encouraged protein sources as tolerated for weight management and muscle maintenance  10/30: -Received consult for TPN -Discussed pt with pharmacy; noted that TPN likely initiated to improve nutritional status prior to surgery -Pt no longer poses severe refeeding risk as he has improved PO intake for past several days during admit. K WNL -Will continue to monitor supplement tolerance (magiccup) and protein intake; pt advanced to soft diet after RD follow up and now has more options available vs liquid diets -TPN does not likely need to meet >90% of estimated nutrition needs as pt with fair appetite(50% PO + poss supplement) and want to prevent decreasing appetite by meeting kcal needs with TPN  11/02: -Pt advanced to Soft diet, appetite decreased d/t loose stools and abd pain. PO intake 20-25%. Enjoys MagicCup, and has been consuming BID with meals.  -Continues with loose stools, C.diff negative. Encouraged pt increase intake of BRAT diet to assist with loose stools (bananas, rice, applesauce, toast etc) -NPO on 11/03 for ileostomy -Clinimix 5/15 infusing at 60 ml/hr with 20% lipids at 10 ml/hr for pre op supplemental nutrition. Provides 1502 kcal (68% est kcal needs) 72 gram protein (63% est protein needs) -Unable to advance TPN to goal rate of 100 ml/hr d/t elevated CBG's -Phos/K/Mg WNL  Height: Ht Readings from Last 1 Encounters:  09/02/14 6' (1.829 m)    Weight Status:   Wt Readings from Last 1 Encounters:  09/02/14 216  lb 3.2 oz (98.068 kg)    Re-estimated needs:  Kcal: 2200-2400  Protein: 115-130 gram  Fluid: >/= 2200 ml daily    Skin: non-pitting RLE and LLE edema  Diet Order: DIET SOFT TPN (CLINIMIX-E) Adult TPN  (CLINIMIX-E) Adult Diet NPO time specified    Intake/Output Summary (Last 24 hours) at 09/09/14 1526 Last data filed at 09/09/14 1300  Gross per 24 hour  Intake   1400 ml  Output      0 ml  Net   1400 ml    Last BM: 11/02; loose stool   Labs:   Recent Labs Lab 09/06/14 0105 09/07/14 0355 09/09/14 0651  NA 136* 137 137  K 4.1 3.7 4.2  CL 100 100 98  CO2 28 28 29   BUN 9 7 9   CREATININE 0.82 0.68 0.65  CALCIUM 8.2* 8.3* 8.5  MG 1.9 1.8 1.9  PHOS  --  3.2 3.2  GLUCOSE 130* 145* 185*    CBG (last 3)   Recent Labs  09/08/14 2120 09/09/14 0734 09/09/14 1149  GLUCAP 157* 177* 186*    Scheduled Meds: . antiseptic oral rinse  7 mL Mouth Rinse BID  . [START ON 09/10/2014] cefoTEtan (CEFOTAN) IV  2 g Intravenous On Call to OR  . citalopram  20 mg Oral Daily  . folic acid  0.5 mg Oral Daily  . insulin aspart  0-15 Units Subcutaneous TID WC & HS  . insulin glargine  15 Units Subcutaneous BID  . pantoprazole  40 mg Oral Daily  . saccharomyces boulardii  250 mg Oral BID  . sodium chloride  10-40 mL Intracatheter Q12H    Continuous Infusions: . sodium chloride    . Marland Kitchen.TPN (CLINIMIX-E) Adult 60 mL/hr at 09/08/14 1752   And  . fat emulsion 250 mL (09/08/14 1752)  . Marland Kitchen.TPN (CLINIMIX-E) Adult     And  . fat emulsion      Lloyd HugerSarah F Asiah Browder MS RD LDN Clinical Dietitian Pager:[phone]

## 2014-09-09 NOTE — Plan of Care (Signed)
Problem: Phase II Progression Outcomes Goal: Discharge plan established Outcome: Progressing Goal: IV changed to normal saline lock Outcome: Progressing

## 2014-09-09 NOTE — Progress Notes (Signed)
PARENTERAL NUTRITION CONSULT NOTE   Pharmacy Consult for TPN Indication: Severe ulcerative colitis and malnutrition  No Known Allergies  Patient Measurements: Height: 6' (182.9 cm) Weight: 216 lb 3.2 oz (98.068 kg) IBW/kg (Calculated) : 77.6 Adjusted Body Weight:  Usual Weight: ~ 246lbs (30 lb wt loss over 3 months)  Vital Signs: Temp: 97.9 F (36.6 C) (11/02 0505) Temp Source: Oral (11/02 0505) BP: 118/56 mmHg (11/02 0505) Pulse Rate: 88 (11/02 0505) Intake/Output from previous day: 11/01 0701 - 11/02 0700 In: 1260 [P.O.:120; I.V.:240; TPN:900] Out: -  Intake/Output from this shift:   Labs:  Recent Labs  09/07/14 0355 09/09/14 0651  WBC 8.5 9.6  HGB 10.6* 10.6*  HCT 31.8* 31.4*  PLT 352 375    Recent Labs  09/07/14 0355 09/09/14 0651  NA 137 137  K 3.7 4.2  CL 100 98  CO2 28 29  GLUCOSE 145* 185*  BUN 7 9  CREATININE 0.68 0.65  CALCIUM 8.3* 8.5  MG 1.8 1.9  PHOS 3.2 3.2  PROT 5.3* 5.4*  ALBUMIN 2.2* 2.0*  AST 9 10  ALT 13 11  ALKPHOS 43 44  BILITOT <0.2* <0.2*  TRIG 112 96   Estimated Creatinine Clearance: 101.3 mL/min (by C-G formula based on Cr of 0.65).    Recent Labs  09/08/14 1305 09/08/14 2120 09/09/14 0734  GLUCAP 163* 157* 177*   Medical History: Past Medical History  Diagnosis Date  . 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 Diabetes mellitus   . CAD (coronary artery disease)   . Personal history of other malignant neoplasm of skin   . Atrial fibrillation   . 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  Colonoscopy reveals severe UC and proctitis with friable strictures.  Biopsies consistent with UC and not malignancy. General surgery consulted and plan is for proctocolectomy and J-pouch.  Pharmacy asked to start TPN to improve nutritional status for surgery. History of diabetes on glimepiride prior to admission, glimepiride held, using Novolog correction scale  Significant events:  10/30: TNA started at 40 ml/hr 11/1: advance TNA to 60 m/hr, removed insulin from TNA and added Lantus 10 units BID  Today (11/2):   Glucose - not at goal. CBGs 157-202 yesterday.  Electrolytes - WNL  Renal - SCr WNL  LFTs - WNL  TGs - 96  Prealbumin - 12.7 (10/30)  NUTRITIONAL GOALS  RD recs: 2200-2400 kcals, 115-130gm protein Clinimix 5/15 at a goal rate of 16300ml/hr + 20% fat emulsion at 310ml/hr to provide: 120g/day protein, 2184Kcal/day.  PLAN                                                                                                                         At 1800 today:  Continue Clinimix E5/15 to 1960ml/hr.  Will not advance rate due to uncontrolled CBGs.     20% fat emulsion at 3410ml/hr.  TNA to contain standard multivitamins and trace elements.  Continue IVF at 6140ml/hr.  Since advanced to soft diet, keep SSI/CBGs ac & hs.  Increase Lantus to 15 units BID.  TNA lab panels on Mondays & Thursdays.  F/u daily.  Clance BollAmanda Mckay Tegtmeyer, PharmD, BCPS Pager: [phone] 09/09/2014 8:54 AM

## 2014-09-10 ENCOUNTER — Encounter (HOSPITAL_COMMUNITY): Payer: Self-pay | Admitting: Anesthesiology

## 2014-09-10 ENCOUNTER — Inpatient Hospital Stay (HOSPITAL_COMMUNITY): Payer: Medicare PPO | Admitting: Anesthesiology

## 2014-09-10 ENCOUNTER — Encounter (HOSPITAL_COMMUNITY)
Admission: AD | Disposition: A | Discharge status: Home / Self Care | Payer: Self-pay | Source: Ambulatory Visit | Attending: Internal Medicine

## 2014-09-10 HISTORY — PX: PERMANENT ILEOSTOMY: SHX6021

## 2014-09-10 HISTORY — PX: COLECTOMY: SHX59

## 2014-09-10 LAB — PREALBUMIN: Prealbumin: 10.3 mg/dL — ABNORMAL LOW (ref 17.0–34.0)

## 2014-09-10 LAB — CBC WITH DIFFERENTIAL/PLATELET
Basophils Absolute: 0 10*3/uL (ref 0.0–0.1)
Basophils Relative: 0 % (ref 0–1)
EOS ABS: 0.1 10*3/uL (ref 0.0–0.7)
EOS PCT: 1 % (ref 0–5)
HCT: 28.9 % — ABNORMAL LOW (ref 39.0–52.0)
Hemoglobin: 9.8 g/dL — ABNORMAL LOW (ref 13.0–17.0)
Lymphocytes Relative: 9 % — ABNORMAL LOW (ref 12–46)
Lymphs Abs: 1.1 10*3/uL (ref 0.7–4.0)
MCH: 31.5 pg (ref 26.0–34.0)
MCHC: 33.9 g/dL (ref 30.0–36.0)
MCV: 92.9 fL (ref 78.0–100.0)
MONO ABS: 1.1 10*3/uL — AB (ref 0.1–1.0)
Monocytes Relative: 9 % (ref 3–12)
NEUTROS PCT: 81 % — AB (ref 43–77)
Neutro Abs: 10 10*3/uL — ABNORMAL HIGH (ref 1.7–7.7)
PLATELETS: 414 10*3/uL — AB (ref 150–400)
RBC: 3.11 MIL/uL — AB (ref 4.22–5.81)
RDW: 13.6 % (ref 11.5–15.5)
WBC: 12.3 10*3/uL — ABNORMAL HIGH (ref 4.0–10.5)

## 2014-09-10 LAB — BASIC METABOLIC PANEL
ANION GAP: 9 (ref 5–15)
BUN: 9 mg/dL (ref 6–23)
CO2: 29 mEq/L (ref 19–32)
Calcium: 8.2 mg/dL — ABNORMAL LOW (ref 8.4–10.5)
Chloride: 99 mEq/L (ref 96–112)
Creatinine, Ser: 0.52 mg/dL (ref 0.50–1.35)
GFR calc Af Amer: >90 mL/min (ref >90)
GLUCOSE: 190 mg/dL — AB (ref 70–99)
POTASSIUM: 4 meq/L (ref 3.7–5.3)
Sodium: 137 mEq/L (ref 137–147)

## 2014-09-10 LAB — GLUCOSE, CAPILLARY
GLUCOSE-CAPILLARY: 154 mg/dL — AB (ref 70–99)
GLUCOSE-CAPILLARY: 282 mg/dL — AB (ref 70–99)
Glucose-Capillary: 187 mg/dL — ABNORMAL HIGH (ref 70–99)
Glucose-Capillary: 312 mg/dL — ABNORMAL HIGH (ref 70–99)
Glucose-Capillary: 289 mg/dL — ABNORMAL HIGH (ref 70–99)

## 2014-09-10 LAB — CEA

## 2014-09-10 LAB — PROTIME-INR
INR: 1.08 (ref 0.00–1.49)
Prothrombin Time: 14.2 seconds (ref 11.6–15.2)

## 2014-09-10 LAB — APTT: APTT: 34 s (ref 24–37)

## 2014-09-10 LAB — SURGICAL PCR SCREEN
MRSA, PCR: NEGATIVE
STAPHYLOCOCCUS AUREUS: NEGATIVE

## 2014-09-10 LAB — PREPARE RBC (CROSSMATCH)

## 2014-09-10 SURGERY — COLECTOMY, TOTAL
Anesthesia: General | Site: Abdomen

## 2014-09-10 MED ORDER — 0.9 % SODIUM CHLORIDE (POUR BTL) OPTIME
TOPICAL | Status: DC | PRN
  Administered 2014-09-10: 6000 mL

## 2014-09-10 MED ORDER — FENTANYL CITRATE 0.05 MG/ML IJ SOLN
INTRAMUSCULAR | Status: AC
Start: 2014-09-10 — End: 2014-09-10
  Filled 2014-09-10: qty 5

## 2014-09-10 MED ORDER — DIPHENHYDRAMINE HCL 50 MG/ML IJ SOLN: 12.5000 mg | Freq: Four times a day (QID) | INTRAMUSCULAR | Status: DC | PRN

## 2014-09-10 MED ORDER — SODIUM CHLORIDE 0.9 % IV SOLN: INTRAVENOUS | Status: DC | PRN

## 2014-09-10 MED ORDER — HYDROMORPHONE 0.3 MG/ML IV SOLN
INTRAVENOUS | Status: AC
  Filled 2014-09-10: qty 25

## 2014-09-10 MED ORDER — ROCURONIUM BROMIDE 100 MG/10ML IV SOLN
INTRAVENOUS | Status: DC | PRN
  Administered 2014-09-10: 10 mg via INTRAVENOUS
  Administered 2014-09-10: 40 mg via INTRAVENOUS
  Administered 2014-09-10: 20 mg via INTRAVENOUS
  Administered 2014-09-10: 10 mg via INTRAVENOUS

## 2014-09-10 MED ORDER — ONDANSETRON HCL 4 MG/2ML IJ SOLN
INTRAMUSCULAR | Status: DC | PRN
  Administered 2014-09-10: 4 mg via INTRAVENOUS

## 2014-09-10 MED ORDER — ONDANSETRON HCL 4 MG/2ML IJ SOLN
4.0000 mg | Freq: Four times a day (QID) | INTRAMUSCULAR | Status: DC | PRN
  Administered 2014-09-11 – 2014-09-12 (×2): 4 mg via INTRAVENOUS
  Filled 2014-09-10: qty 2

## 2014-09-10 MED ORDER — DIPHENHYDRAMINE HCL 12.5 MG/5ML PO ELIX: 12.5000 mg | ORAL_SOLUTION | Freq: Four times a day (QID) | ORAL | Status: DC | PRN

## 2014-09-10 MED ORDER — PHENYLEPHRINE HCL 10 MG/ML IJ SOLN
INTRAMUSCULAR | Status: DC | PRN
  Administered 2014-09-10 (×2): 40 ug via INTRAVENOUS

## 2014-09-10 MED ORDER — SODIUM CHLORIDE 0.9 % IV SOLN
INTRAVENOUS | Status: AC
  Administered 2014-09-10: 1000 mL via INTRAVENOUS
  Administered 2014-09-11 – 2014-09-12 (×2): via INTRAVENOUS

## 2014-09-10 MED ORDER — GLYCOPYRROLATE 0.2 MG/ML IJ SOLN
INTRAMUSCULAR | Status: AC
  Filled 2014-09-10: qty 4

## 2014-09-10 MED ORDER — MIDAZOLAM HCL 5 MG/5ML IJ SOLN
INTRAMUSCULAR | Status: DC | PRN
  Administered 2014-09-10: 1 mg via INTRAVENOUS

## 2014-09-10 MED ORDER — LIDOCAINE HCL (CARDIAC) 20 MG/ML IV SOLN
INTRAVENOUS | Status: DC | PRN
  Administered 2014-09-10: 80 mg via INTRAVENOUS

## 2014-09-10 MED ORDER — ROCURONIUM BROMIDE 100 MG/10ML IV SOLN
INTRAVENOUS | Status: AC
  Filled 2014-09-10: qty 1

## 2014-09-10 MED ORDER — LIDOCAINE HCL (CARDIAC) 20 MG/ML IV SOLN
INTRAVENOUS | Status: AC
  Filled 2014-09-10: qty 5

## 2014-09-10 MED ORDER — SUCCINYLCHOLINE CHLORIDE 20 MG/ML IJ SOLN
INTRAMUSCULAR | Status: DC | PRN
  Administered 2014-09-10: 100 mg via INTRAVENOUS

## 2014-09-10 MED ORDER — SODIUM CHLORIDE 0.9 % IV SOLN
Freq: Once | INTRAVENOUS | Status: DC
Start: 2014-09-10 — End: 2014-09-10

## 2014-09-10 MED ORDER — HYDROMORPHONE HCL 1 MG/ML IJ SOLN
0.2500 mg | INTRAMUSCULAR | Status: DC | PRN
  Administered 2014-09-10 (×2): 0.5 mg via INTRAVENOUS

## 2014-09-10 MED ORDER — FENTANYL CITRATE 0.05 MG/ML IJ SOLN
INTRAMUSCULAR | Status: DC | PRN
  Administered 2014-09-10 (×5): 50 ug via INTRAVENOUS

## 2014-09-10 MED ORDER — HYDROMORPHONE HCL 1 MG/ML IJ SOLN
INTRAMUSCULAR | Status: DC | PRN
  Administered 2014-09-10: 0.5 mg via INTRAVENOUS

## 2014-09-10 MED ORDER — PROPOFOL 10 MG/ML IV BOLUS
INTRAVENOUS | Status: AC
  Filled 2014-09-10: qty 20

## 2014-09-10 MED ORDER — GLYCOPYRROLATE 0.2 MG/ML IJ SOLN
INTRAMUSCULAR | Status: DC | PRN
  Administered 2014-09-10: .8 mg via INTRAVENOUS

## 2014-09-10 MED ORDER — ONDANSETRON HCL 4 MG/2ML IJ SOLN: 4.0000 mg | Freq: Once | INTRAMUSCULAR | Status: DC | PRN

## 2014-09-10 MED ORDER — NALOXONE HCL 0.4 MG/ML IJ SOLN: 0.4000 mg | INTRAMUSCULAR | Status: DC | PRN

## 2014-09-10 MED ORDER — INSULIN ASPART 100 UNIT/ML ~~LOC~~ SOLN
0.0000 [IU] | Freq: Four times a day (QID) | SUBCUTANEOUS | Status: DC
  Administered 2014-09-10: 8 [IU] via SUBCUTANEOUS
  Administered 2014-09-11: 11 [IU] via SUBCUTANEOUS

## 2014-09-10 MED ORDER — ONDANSETRON HCL 4 MG/2ML IJ SOLN
INTRAMUSCULAR | Status: AC
  Filled 2014-09-10: qty 2

## 2014-09-10 MED ORDER — TRACE MINERALS CR-CU-F-FE-I-MN-MO-SE-ZN IV SOLN
INTRAVENOUS | Status: AC
  Administered 2014-09-10: 18:00:00 via INTRAVENOUS
  Filled 2014-09-10: qty 2000

## 2014-09-10 MED ORDER — SODIUM CHLORIDE 0.9 % IJ SOLN: 9.0000 mL | INTRAMUSCULAR | Status: DC | PRN

## 2014-09-10 MED ORDER — INSULIN GLARGINE 100 UNIT/ML ~~LOC~~ SOLN
20.0000 [IU] | Freq: Two times a day (BID) | SUBCUTANEOUS | Status: AC
  Administered 2014-09-10 – 2014-09-11 (×2): 20 [IU] via SUBCUTANEOUS
  Filled 2014-09-10 (×3): qty 0.2

## 2014-09-10 MED ORDER — PROPOFOL 10 MG/ML IV BOLUS
INTRAVENOUS | Status: DC | PRN
  Administered 2014-09-10: 140 mg via INTRAVENOUS

## 2014-09-10 MED ORDER — MIDAZOLAM HCL 2 MG/2ML IJ SOLN
INTRAMUSCULAR | Status: AC
  Filled 2014-09-10: qty 2

## 2014-09-10 MED ORDER — NEOSTIGMINE METHYLSULFATE 10 MG/10ML IV SOLN
INTRAVENOUS | Status: DC | PRN
  Administered 2014-09-10: 5 mg via INTRAVENOUS

## 2014-09-10 MED ORDER — HYDROMORPHONE HCL 2 MG/ML IJ SOLN
INTRAMUSCULAR | Status: AC
  Filled 2014-09-10: qty 1

## 2014-09-10 MED ORDER — LACTATED RINGERS IV SOLN
INTRAVENOUS | Status: DC
  Administered 2014-09-10: 1000 mL via INTRAVENOUS

## 2014-09-10 MED ORDER — FENTANYL CITRATE 0.05 MG/ML IJ SOLN: 25.0000 ug | INTRAMUSCULAR | Status: DC | PRN

## 2014-09-10 MED ORDER — HYDROMORPHONE HCL 1 MG/ML IJ SOLN
INTRAMUSCULAR | Status: AC
  Filled 2014-09-10: qty 1

## 2014-09-10 MED ORDER — FAT EMULSION 20 % IV EMUL
250.0000 mL | INTRAVENOUS | Status: AC
  Administered 2014-09-10: 250 mL via INTRAVENOUS
  Filled 2014-09-10: qty 250

## 2014-09-10 MED ORDER — SODIUM CHLORIDE 0.9 % IV SOLN: INTRAVENOUS | Status: DC

## 2014-09-10 MED ORDER — DEXTROSE 5 % IV SOLN
2.0000 g | Freq: Two times a day (BID) | INTRAVENOUS | Status: AC
  Administered 2014-09-10 – 2014-09-11 (×2): 2 g via INTRAVENOUS
  Filled 2014-09-10 (×2): qty 2

## 2014-09-10 MED ORDER — INSULIN ASPART 100 UNIT/ML ~~LOC~~ SOLN
SUBCUTANEOUS | Status: AC
  Filled 2014-09-10: qty 1

## 2014-09-10 MED ORDER — PHENYLEPHRINE HCL 10 MG/ML IJ SOLN
20.0000 mg | INTRAMUSCULAR | Status: DC | PRN
Start: 2014-09-10 — End: 2014-09-10
  Administered 2014-09-10: 20 ug/min via INTRAVENOUS

## 2014-09-10 MED ORDER — HYDROMORPHONE 0.3 MG/ML IV SOLN
INTRAVENOUS | Status: DC
  Administered 2014-09-10: 0.3 mg via INTRAVENOUS
  Administered 2014-09-10: 0.6 mg via INTRAVENOUS
  Administered 2014-09-11: 2.1 mg via INTRAVENOUS
  Administered 2014-09-11: 2.4 mg via INTRAVENOUS
  Administered 2014-09-11: 1.77 mg via INTRAVENOUS
  Administered 2014-09-11: 25 mg via INTRAVENOUS
  Administered 2014-09-11: 2.34 mg via INTRAVENOUS
  Administered 2014-09-12: 06:00:00 via INTRAVENOUS
  Administered 2014-09-12: 3.66 mg via INTRAVENOUS
  Administered 2014-09-12 (×3): 1.5 mg via INTRAVENOUS
  Administered 2014-09-13: 4.1 mg via INTRAVENOUS
  Administered 2014-09-13 (×2): via INTRAVENOUS
  Administered 2014-09-13: 5.09 mg via INTRAVENOUS
  Administered 2014-09-13: 23:00:00 via INTRAVENOUS
  Administered 2014-09-13: 2.1 mg via INTRAVENOUS
  Administered 2014-09-13: 1.2 mg via INTRAVENOUS
  Administered 2014-09-14 (×2): 2.1 mg via INTRAVENOUS
  Filled 2014-09-10 (×7): qty 25

## 2014-09-10 SURGICAL SUPPLY — 69 items
APPLICATOR COTTON TIP 6IN STRL (MISCELLANEOUS) ×4 IMPLANT
BLADE EXTENDED COATED 6.5IN (ELECTRODE) ×8 IMPLANT
BLADE HEX COATED 2.75 (ELECTRODE) ×5 IMPLANT
BLADE SURG SZ10 CARB STEEL (BLADE) ×5 IMPLANT
BNDG GAUZE ELAST 4 BULKY (GAUZE/BANDAGES/DRESSINGS) ×3 IMPLANT
CANISTER SUCTION 2500CC (MISCELLANEOUS) ×2 IMPLANT
CHLORAPREP W/TINT 26ML (MISCELLANEOUS) ×8 IMPLANT
CLIP TI LARGE 6 (CLIP) IMPLANT
COVER MAYO STAND STRL (DRAPES) ×5 IMPLANT
COVER SURGICAL LIGHT HANDLE (MISCELLANEOUS) ×2 IMPLANT
DRAIN CHANNEL 19F RND (DRAIN) ×3 IMPLANT
DRAPE LAPAROSCOPIC ABDOMINAL (DRAPES) ×5 IMPLANT
DRAPE SHEET LG 3/4 BI-LAMINATE (DRAPES) IMPLANT
DRAPE WARM FLUID 44X44 (DRAPE) ×5 IMPLANT
ELECT REM PT RETURN 9FT ADLT (ELECTROSURGICAL) ×5
ELECTRODE REM PT RTRN 9FT ADLT (ELECTROSURGICAL) ×3 IMPLANT
EVACUATOR SILICONE 100CC (DRAIN) ×3 IMPLANT
GAUZE SPONGE 4X4 12PLY STRL (GAUZE/BANDAGES/DRESSINGS) ×5 IMPLANT
GLOVE BIO SURGEON STRL SZ7 (GLOVE) ×10 IMPLANT
GLOVE BIOGEL PI IND STRL 7.0 (GLOVE) ×3 IMPLANT
GLOVE BIOGEL PI IND STRL 7.5 (GLOVE) ×6 IMPLANT
GLOVE BIOGEL PI INDICATOR 7.0 (GLOVE) ×2
GLOVE BIOGEL PI INDICATOR 7.5 (GLOVE) ×4
GOWN STRL REUS W/ TWL XL LVL3 (GOWN DISPOSABLE) ×3 IMPLANT
GOWN STRL REUS W/TWL LRG LVL3 (GOWN DISPOSABLE) ×10 IMPLANT
GOWN STRL REUS W/TWL XL LVL3 (GOWN DISPOSABLE) ×15 IMPLANT
HEMOSTAT SNOW SURGICEL 2X4 (HEMOSTASIS) ×3 IMPLANT
HOLDER FOLEY CATH W/STRAP (MISCELLANEOUS) ×3 IMPLANT
KIT BASIN OR (CUSTOM PROCEDURE TRAY) ×5 IMPLANT
LEGGING LITHOTOMY PAIR STRL (DRAPES) ×2 IMPLANT
LIGASURE IMPACT 36 18CM CVD LR (INSTRUMENTS) ×3 IMPLANT
MANIFOLD NEPTUNE II (INSTRUMENTS) ×3 IMPLANT
NS IRRIG 1000ML POUR BTL (IV SOLUTION) ×10 IMPLANT
PACK GENERAL/GYN (CUSTOM PROCEDURE TRAY) ×5 IMPLANT
PAD ABD 8X10 STRL (GAUZE/BANDAGES/DRESSINGS) ×3 IMPLANT
POUCH DRAINABLE 1PC 2 1/4 FLAT (OSTOMY) ×3 IMPLANT
SHEARS FOC LG CVD HARMONIC 17C (MISCELLANEOUS) IMPLANT
SHEARS HARMONIC ACE PLUS 36CM (ENDOMECHANICALS) IMPLANT
SPONGE LAP 18X18 X RAY DECT (DISPOSABLE) ×15 IMPLANT
STAPLER CUT CVD 40MM BLUE (STAPLE) ×3 IMPLANT
STAPLER PROXIMATE 75MM BLUE (STAPLE) ×3 IMPLANT
STAPLER VISISTAT 35W (STAPLE) ×5 IMPLANT
SUCTION POOLE TIP (SUCTIONS) ×8 IMPLANT
SUT ETHILON 2 0 PS N (SUTURE) ×3 IMPLANT
SUT NOV 1 T60/GS (SUTURE) IMPLANT
SUT NOVA 1 T20/GS 25DT (SUTURE) ×6 IMPLANT
SUT NOVA T20/GS 25 (SUTURE) ×10 IMPLANT
SUT PDS AB 1 CTX 36 (SUTURE) ×10 IMPLANT
SUT PDS AB 1 TP1 54 (SUTURE) IMPLANT
SUT PDS AB 1 TP1 96 (SUTURE) ×6 IMPLANT
SUT PDS AB 3-0 SH 27 (SUTURE) ×5 IMPLANT
SUT PDS AB 4-0 SH 27 (SUTURE) IMPLANT
SUT PROLENE 2 0 BLUE (SUTURE) ×3 IMPLANT
SUT PROLENE 2 0 KS (SUTURE) IMPLANT
SUT SILK 2 0 (SUTURE) ×5
SUT SILK 2 0 SH CR/8 (SUTURE) ×19 IMPLANT
SUT SILK 2 0SH CR/8 30 (SUTURE) IMPLANT
SUT SILK 2-0 18XBRD TIE 12 (SUTURE) ×5 IMPLANT
SUT SILK 2-0 30XBRD TIE 12 (SUTURE) IMPLANT
SUT SILK 3 0 (SUTURE) ×5
SUT SILK 3 0 SH CR/8 (SUTURE) ×7 IMPLANT
SUT SILK 3-0 18XBRD TIE 12 (SUTURE) ×5 IMPLANT
SUT VIC AB 3-0 SH 8-18 (SUTURE) ×3 IMPLANT
TAPE CLOTH SURG 6X10 WHT LF (GAUZE/BANDAGES/DRESSINGS) ×3 IMPLANT
TOWEL OR 17X26 10 PK STRL BLUE (TOWEL DISPOSABLE) ×7 IMPLANT
TRAY FOLEY CATH 14FRSI W/METER (CATHETERS) ×2 IMPLANT
TRAY FOLEY CATH 16FRSI W/METER (SET/KITS/TRAYS/PACK) ×3 IMPLANT
WATER STERILE IRR 1500ML POUR (IV SOLUTION) IMPLANT
YANKAUER SUCT BULB TIP NO VENT (SUCTIONS) ×5 IMPLANT

## 2014-09-10 NOTE — Plan of Care (Signed)
Problem: Discharge Progression Outcomes Goal: Pain controlled with appropriate interventions Outcome: Progressing Goal: Hemodynamically stable Outcome: Progressing

## 2014-09-10 NOTE — Progress Notes (Signed)
Will call report to [phone] SDU. She's currently with Mr Thurmond ButtsWade and may be unable to take call per US Debbie.

## 2014-09-10 NOTE — Transfer of Care (Signed)
Immediate Anesthesia Transfer of Care Note  Patient: Ronald QuarryWilliam M Mayo  Procedure(s) Performed: Procedure(s): TOTAL COLECTOMY (N/A) PERMANENT ILEOSTOMY  Patient Location: PACU  Anesthesia Type:General  Level of Consciousness: awake, alert , oriented and patient cooperative  Airway & Oxygen Therapy: Patient Spontanous Breathing and Patient connected to face mask oxygen  Post-op Assessment: Report given to PACU RN, Post -op Vital signs reviewed and stable and Patient moving all extremities  Post vital signs: Reviewed and stable  Complications: No apparent anesthesia complications

## 2014-09-10 NOTE — Progress Notes (Signed)
PARENTERAL NUTRITION CONSULT NOTE   Pharmacy Consult for TPN Indication: Severe ulcerative colitis and malnutrition  No Known Allergies  Patient Measurements: Height: 6' (182.9 cm) Weight: 216 lb 3.2 oz (98.068 kg) IBW/kg (Calculated) : 77.6 Adjusted Body Weight:  Usual Weight: ~ 246lbs (30 lb wt loss over 3 months)  Vital Signs: Temp: 98.2 F (36.8 C) (11/03 0603) Temp Source: Oral (11/03 0603) BP: 123/55 mmHg (11/03 0603) Pulse Rate: 75 (11/03 0603) Intake/Output from previous day: 11/02 0701 - 11/03 0700 In: 1624.5 [P.O.:480; I.V.:135.3; TPN:1009.2] Out: -  Intake/Output from this shift:   Labs:  Recent Labs  09/09/14 0651 09/10/14 0625  WBC 9.6  --   HGB 10.6*  --   HCT 31.4*  --   PLT 375  --   APTT  --  34  INR  --  1.08    Recent Labs  09/09/14 0651 09/10/14 0625  NA 137 137  K 4.2 4.0  CL 98 99  CO2 29 29  GLUCOSE 185* 190*  BUN 9 9  CREATININE 0.65 0.52  CALCIUM 8.5 8.2*  MG 1.9  --   PHOS 3.2  --   PROT 5.4*  --   ALBUMIN 2.0*  --   AST 10  --   ALT 11  --   ALKPHOS 44  --   BILITOT <0.2*  --   PREALBUMIN 10.3*  --   TRIG 96  --    Estimated Creatinine Clearance: 101.3 mL/min (by C-G formula based on Cr of 0.52).    Recent Labs  09/09/14 2305 09/10/14 0731 09/10/14 1011  GLUCAP 185* 187* 154*   Medical History: Past Medical History  Diagnosis Date  . 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 Diabetes mellitus   . CAD (coronary artery disease)   . Personal history of other malignant neoplasm of skin   . Atrial fibrillation   . 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  Colonoscopy reveals severe UC and proctitis with friable strictures.  Biopsies consistent with UC and not malignancy. General surgery consulted and plan is for proctocolectomy and J-pouch.  Pharmacy asked to start TPN to improve nutritional status for surgery. History of diabetes on glimepiride prior to admission, glimepiride held, using Novolog correction scale  Significant events:  10/30: TNA started at 40 ml/hr 11/1: advance TNA to 60 m/hr, removed insulin from TNA and added Lantus BID 11/3: total colectomy today  Today (  11/3):   Glucose - not at goal but improving after increasing Lantus.  CBGs 155-187 yesterday.  Electrolytes - WNL  Renal - SCr WNL  LFTs - WNL  TGs - 96 (11/2)  Prealbumin - 12.7 (10/30), 10.3 (11/2)  NUTRITIONAL GOALS                                                                                             RD recs: 2200-2400 kcals, 115-130gm protein Clinimix 5/15 at a goal rate of 13700ml/hr + 20% fat emulsion at 8710ml/hr to provide: 120g/day protein, 2184Kcal/day.  PLAN                                                                                                                         At 1800 today:  Advance Clinimix E5/15 to 75 ml/hr.  CBGs improved but not yet at goal so will advance rate slowly and increase Lantus dose further.  At 75 ml/hr with lipids at 10 ml/hr, patient will receive 90g protein/day and 2064 kcal/day.  If patient resumes MagicCup supplement and soft diet postop, will meet >90% of nutrition goals.  20% fat emulsion at 2710ml/hr.  TNA to contain standard multivitamins and trace elements.  Reduce IVF to 620ml/hr.    Continue SSI/CBGs as ordered for now.  Increase Lantus to 20 units BID.  TNA lab panels on Mondays & Thursdays.  F/u daily.  Clance BollAmanda Johnelle Tafolla,  PharmD, BCPS Pager: [phone] 09/10/2014 11:54 AM

## 2014-09-10 NOTE — Anesthesia Postprocedure Evaluation (Signed)
  Anesthesia Post-op Note  Patient: Ronald Mayo  Procedure(s) Performed: Procedure(s) (LRB): TOTAL COLECTOMY (N/A) PERMANENT ILEOSTOMY  Patient Location: PACU  Anesthesia Type: General  Level of Consciousness: awake and alert   Airway and Oxygen Therapy: Patient Spontanous Breathing  Post-op Pain: mild  Post-op Assessment: Post-op Vital signs reviewed, Patient's Cardiovascular Status Stable, Respiratory Function Stable, Patent Airway and No signs of Nausea or vomiting  Last Vitals:  Filed Vitals:   09/10/14 1700  BP:   Pulse:   Temp:   Resp: 24    Post-op Vital Signs: stable   Complications: No apparent anesthesia complications

## 2014-09-10 NOTE — Op Note (Signed)
Preoperative diagnosis ulcerative colitis and sigmoid stricture with large bowel obstruction Postoperative diagnosis: Same as above Procedure: Total abdominal colectomy with end ileostomy Surgeon: Dr. Harden MoMatt Ibn Stief Asst.: Dr. Estelle GrumblesSteve Gross Anesthesia: Gen. Estimated blood loss: 400 mL Drains: JP to pelvis Complications: None Specimens: Abdominal colon to pathology Special count was correct at completion Disposition to recovery stable  Indications: This is 73 year old male with long-standing ulcerative colitis. He was been recently admitted and diagnosed with a sigmoid stricture that is unable to be traversed by the colonoscope. He has active ulcerative colitis but has not been amenable to medical therapy either. We discussed proceeding in the operating room for a total abdominal colectomy with an end ileostomy. He's had a period of preoperative nutrition due to the fact his prealbumin was very low.  Procedure: After informed consent was obtained the patient was taken the operating room. He was given cefoxitin. Sequential compression devices were on his legs. He was in placed under general anesthesia without complication. A Foley catheter and nasogastric tube were placed. His abdomen was then prepped and draped in the standard sterile surgical fashion. Surgical timeout was then performed.  I made a midline incision which encompassed his prior umbilical hernia repair. He had a piece of mesh at this area which was placed with stitches as well as some sort of attacks. I removed these tacks as they were sharp. Then I was able to lysis adhesions and entered into his abdomen. I then inserted a Bookwalter retractor. Then took down his white line on the left side. This was very scarred in and inflamed due to his long-standing ulcerative colitis. I was able to identify the stricture in his sigmoid colon. Eventually I was able to roll this medially. I was able to identify the location of the ureter and this was  preserved throughout the operation. I identified the rectum and I divided this with a contour stapler. I then went to the right side and released is white line on the right side. This was then rolled medial. I released from the duodenum which was observed during this portion of the operation as well. 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 I lost several 100 mL of blood from this before I was able to fix it with 2-0 silk suture. I then was able to remove his omentum from the transverse colon and takedown is splenic flexure as well. Once the whole colon was free and we then came across the mesentery with a combination of 2-0 silk ligatures and the LigaSure device removing the colon in its entirety. Hemostasis was then obtained. There were a number of areas due to the friability just tissue that I oversewed with 2-0 silk sutures. Total blood loss was about 400 mL. We then irrigated. I confirmed position of his nasogastric tube. I then brought up his ileostomy through his premarked site. I then closed up and placed a JP drain in his pelvis and secured this a 2-0 nylon. I oversewed his rectal stump with 2-0 Prolene suture and left these long for possible future operation. I then closed the abdomen with #1 looped PDS and some interrupted #1 Novafil sutures. I left the wound open. The ileostomy was then matured with 3-0 Vicryl sutures. This appeared viable and was patent. A bag was placed. I placed a dressing. He tolerated as well as extremity and transferred to recovery stable.

## 2014-09-11 ENCOUNTER — Encounter (HOSPITAL_COMMUNITY): Payer: Self-pay | Admitting: General Surgery

## 2014-09-11 DIAGNOSIS — I9581 Postprocedural hypotension: Secondary | ICD-10-CM

## 2014-09-11 LAB — CBC
HCT: 29.5 % — ABNORMAL LOW (ref 39.0–52.0)
Hemoglobin: 9.9 g/dL — ABNORMAL LOW (ref 13.0–17.0)
MCH: 31.4 pg (ref 26.0–34.0)
MCHC: 33.6 g/dL (ref 30.0–36.0)
MCV: 93.7 fL (ref 78.0–100.0)
PLATELETS: 389 10*3/uL (ref 150–400)
RBC: 3.15 MIL/uL — ABNORMAL LOW (ref 4.22–5.81)
RDW: 13.5 % (ref 11.5–15.5)
WBC: 26.3 10*3/uL — ABNORMAL HIGH (ref 4.0–10.5)

## 2014-09-11 LAB — BASIC METABOLIC PANEL
ANION GAP: 9 (ref 5–15)
BUN: 17 mg/dL (ref 6–23)
CALCIUM: 8.2 mg/dL — AB (ref 8.4–10.5)
CO2: 29 mEq/L (ref 19–32)
Chloride: 98 mEq/L (ref 96–112)
Creatinine, Ser: 0.61 mg/dL (ref 0.50–1.35)
GFR calc Af Amer: >90 mL/min (ref >90)
GFR calc non Af Amer: >90 mL/min (ref >90)
Glucose, Bld: 318 mg/dL — ABNORMAL HIGH (ref 70–99)
Potassium: 4.9 mEq/L (ref 3.7–5.3)
Sodium: 136 mEq/L — ABNORMAL LOW (ref 137–147)

## 2014-09-11 LAB — GLUCOSE, CAPILLARY
GLUCOSE-CAPILLARY: 218 mg/dL — AB (ref 70–99)
Glucose-Capillary: 305 mg/dL — ABNORMAL HIGH (ref 70–99)
Glucose-Capillary: 227 mg/dL — ABNORMAL HIGH (ref 70–99)
Glucose-Capillary: 219 mg/dL — ABNORMAL HIGH (ref 70–99)

## 2014-09-11 MED ORDER — PANTOPRAZOLE SODIUM 40 MG IV SOLR: 40.0000 mg | INTRAVENOUS | Status: DC

## 2014-09-11 MED ORDER — TRACE MINERALS CR-CU-F-FE-I-MN-MO-SE-ZN IV SOLN
INTRAVENOUS | Status: AC
  Administered 2014-09-11: 17:00:00 via INTRAVENOUS
  Filled 2014-09-11: qty 2000

## 2014-09-11 MED ORDER — SODIUM CHLORIDE 0.9 % IV BOLUS (SEPSIS)
500.0000 mL | Freq: Once | INTRAVENOUS | Status: AC
  Administered 2014-09-11: 500 mL via INTRAVENOUS

## 2014-09-11 MED ORDER — SODIUM CHLORIDE 0.9 % IV SOLN: INTRAVENOUS | Status: DC | PRN

## 2014-09-11 MED ORDER — INSULIN ASPART 100 UNIT/ML ~~LOC~~ SOLN
0.0000 [IU] | SUBCUTANEOUS | Status: DC
  Administered 2014-09-11: 3 [IU] via SUBCUTANEOUS
  Administered 2014-09-11: 5 [IU] via SUBCUTANEOUS
  Administered 2014-09-11 – 2014-09-12 (×2): 3 [IU] via SUBCUTANEOUS
  Administered 2014-09-12: 2 [IU] via SUBCUTANEOUS
  Administered 2014-09-12 (×2): 3 [IU] via SUBCUTANEOUS
  Administered 2014-09-12: 5 [IU] via SUBCUTANEOUS
  Administered 2014-09-13 (×5): 2 [IU] via SUBCUTANEOUS
  Administered 2014-09-14 (×3): 3 [IU] via SUBCUTANEOUS
  Administered 2014-09-14 (×3): 2 [IU] via SUBCUTANEOUS
  Administered 2014-09-15 – 2014-09-16 (×7): 3 [IU] via SUBCUTANEOUS
  Administered 2014-09-16: 2 [IU] via SUBCUTANEOUS
  Administered 2014-09-16 – 2014-09-17 (×5): 3 [IU] via SUBCUTANEOUS
  Administered 2014-09-17: 2 [IU] via SUBCUTANEOUS
  Administered 2014-09-17 (×2): 3 [IU] via SUBCUTANEOUS
  Administered 2014-09-19 – 2014-09-21 (×9): 2 [IU] via SUBCUTANEOUS

## 2014-09-11 MED ORDER — BECAPLERMIN 0.01 % EX GEL
CUTANEOUS | Status: DC
  Filled 2014-09-11: qty 15

## 2014-09-11 MED ORDER — BECAPLERMIN 0.01 % EX GEL
Freq: Every day | CUTANEOUS | Status: DC
  Filled 2014-09-11: qty 15

## 2014-09-11 MED ORDER — HEPARIN SODIUM (PORCINE) 5000 UNIT/ML IJ SOLN
5000.0000 [IU] | Freq: Three times a day (TID) | INTRAMUSCULAR | Status: DC
  Administered 2014-09-11 – 2014-09-16 (×14): 5000 [IU] via SUBCUTANEOUS
  Filled 2014-09-11 (×15): qty 1

## 2014-09-11 MED ORDER — METOPROLOL TARTRATE 1 MG/ML IV SOLN
INTRAVENOUS | Status: AC
  Filled 2014-09-11: qty 5

## 2014-09-11 MED ORDER — BECAPLERMIN 0.01 % EX GEL: CUTANEOUS | Status: DC

## 2014-09-11 MED ORDER — METOPROLOL TARTRATE 1 MG/ML IV SOLN
2.5000 mg | Freq: Four times a day (QID) | INTRAVENOUS | Status: DC
  Administered 2014-09-11 – 2014-09-25 (×45): 2.5 mg via INTRAVENOUS
  Filled 2014-09-11 (×57): qty 5

## 2014-09-11 MED ORDER — FAT EMULSION 20 % IV EMUL
250.0000 mL | INTRAVENOUS | Status: AC
  Administered 2014-09-11: 250 mL via INTRAVENOUS
  Filled 2014-09-11: qty 250

## 2014-09-11 MED ORDER — METOPROLOL TARTRATE 1 MG/ML IV SOLN: 2.5000 mg | INTRAVENOUS | Status: DC | PRN

## 2014-09-11 MED ORDER — INSULIN GLARGINE 100 UNIT/ML ~~LOC~~ SOLN
25.0000 [IU] | Freq: Two times a day (BID) | SUBCUTANEOUS | Status: DC
  Administered 2014-09-11 – 2014-09-12 (×2): 25 [IU] via SUBCUTANEOUS
  Filled 2014-09-11 (×2): qty 0.25

## 2014-09-11 MED ORDER — INSULIN GLARGINE 100 UNIT/ML ~~LOC~~ SOLN
5.0000 [IU] | Freq: Once | SUBCUTANEOUS | Status: AC
  Administered 2014-09-11: 5 [IU] via SUBCUTANEOUS
  Filled 2014-09-11: qty 0.05

## 2014-09-11 MED ORDER — PANTOPRAZOLE SODIUM 40 MG IV SOLR
40.0000 mg | INTRAVENOUS | Status: DC
  Administered 2014-09-11 – 2014-09-25 (×15): 40 mg via INTRAVENOUS
  Filled 2014-09-11 (×15): qty 40

## 2014-09-11 NOTE — Progress Notes (Signed)
Subjective:  Asked to re-evaluate and follow post op; Day 1 s/p total abdominal colectomy with end ileostomy  Objective:   Vital Signs in the last 24 hours: Temp:  [97.6 F (36.4 C)-98.4 F (36.9 C)] 98 F (36.7 C) (11/04 0400) Pulse Rate:  [37-110] 102 (11/04 0500) Resp:  [0-27] 18 (11/04 0819) BP: (129-147)/(57-84) 129/57 mmHg (11/03 1615) SpO2:  [97 %-100 %] 98 % (11/04 0819) Arterial Line BP: (124-165)/(55-61) 127/61 mmHg (11/03 1615) Weight:  [224 lb 6.9 oz (101.8 kg)] 224 lb 6.9 oz (101.8 kg) (11/03 1615)  Intake/Output from previous day: 11/03 0701 - 11/04 0700 In: 2885 [I.V.:2425; TPN:460] Out: 1085 [Urine:675; Drains:235; Blood:175]  Medications: . antiseptic oral rinse  7 mL Mouth Rinse BID  . [START ON 09/12/2014] becaplermin   Topical Q7 days  . heparin subcutaneous  5,000 Units Subcutaneous 3 times per day  . HYDROmorphone PCA 0.3 mg/mL   Intravenous 6 times per day  . insulin aspart  0-15 Units Subcutaneous 6 times per day  . insulin glargine  25 Units Subcutaneous BID  . pantoprazole (PROTONIX) IV  40 mg Intravenous Q24H  . sodium chloride  10-40 mL Intracatheter Q12H    . sodium chloride 40 mL/hr at 09/11/14 0500  . Marland Kitchen.TPN (CLINIMIX-E) Adult 75 mL/hr at 09/11/14 0500   And  . fat emulsion 250 mL (09/10/14 1801)  . Marland Kitchen.TPN (CLINIMIX-E) Adult     And  . fat emulsion      Physical Exam:   General appearance: alert and no distress Neck: no adenopathy, no carotid bruit, no JVD, supple, symmetrical, trachea midline and thyroid not enlarged, symmetric, no tenderness/mass/nodules Lungs: decreased BS; no wheezing Heart: irregularly irregular rhythm c/w AF 1/6 sem Abdomen: tender; no BS Extremities: no edema, redness or tenderness in the calves or thighs   Rate: 101  Rhythm: atrial fibrillation  Lab Results:   Recent Labs  09/10/14 0625 09/11/14 0400  NA 137 136*  K 4.0 4.9  CL 99 98  CO2 29 29  GLUCOSE 190* 318*  BUN 9 17  CREATININE 0.52 0.61      No results for input(s): TROPONINI in the last 72 hours.  Invalid input(s): CK, MB  Hepatic Function Panel  Recent Labs  09/09/14 0651  PROT 5.4*  ALBUMIN 2.0*  AST 10  ALT 11  ALKPHOS 44  BILITOT <0.2*    Recent Labs  09/10/14 0625  INR 1.08   BNP (last 3 results) No results for input(s): PROBNP in the last 8760 hours.  Lipid Panel     Component Value Date/Time   CHOL  03/16/2009 0145    137        ATP III CLASSIFICATION:  <200     mg/dL   Desirable  [id number]  mg/dL   Borderline High  >=045>=240    mg/dL   High          TRIG 96 09/09/2014 0651   HDL 30* 03/16/2009 0145   CHOLHDL 4.6 03/16/2009 0145   VLDL 15 03/16/2009 0145   LDLCALC  03/16/2009 0145    92        Total Cholesterol/HDL:CHD Risk Coronary Heart Disease Risk Table                     Men   Women  1/2 Average Risk   3.4   3.3  Average Risk       5.0   4.4  2 X Average Risk  9.6   7.1  3 X Average Risk  23.4   11.0        Use the calculated Patient Ratio above and the CHD Risk Table to determine the patient's CHD Risk.        ATP III CLASSIFICATION (LDL):  <100     mg/dL   Optimal  [id number]  mg/dL   Near or Above                    Optimal  130-159  mg/dL   Borderline  [id number]  mg/dL   High  >956>190     mg/dL   Very High      Imaging:  No results found.    Assessment/Plan:   Principal Problem:   Ulcerative colitis with intestinal obstruction Active Problems:   Atrial fibrillation   GERD (gastroesophageal reflux disease)   Ulcerative colitis   Diabetes   Protein-calorie malnutrition, severe   Stricture of sigmoid colon  Currently day 1 s/p  total abdominal colectomy with end ileostomy. No chest pain or dyspnea presently, but with post-op pain.  Pt has permanent AF. Rate now is 100-105, but BP somewhat low at 98 to 105 systolic. He has received NS bolus. Currently NPO. Will write order for IV Lopressor 2.5 mg on a PRN basis if HR > 120. As BP increases can then change to every 4  hrs for rate control if BP stable and transition to oral therapy. Consider starting oral anticoagulation in several days when stable from his abdominal surgery.   Lennette Biharihomas A. Daniel Johndrow, MD, [facility] IncFACC 09/11/2014, 12:08 PM

## 2014-09-11 NOTE — Progress Notes (Signed)
PARENTERAL NUTRITION CONSULT NOTE   Pharmacy Consult for TPN Indication: Severe ulcerative colitis and malnutrition  No Known Allergies  Patient Measurements: Height: 6' (182.9 cm) Weight: 224 lb 6.9 oz (101.8 kg) IBW/kg (Calculated) : 77.6 Adjusted Body Weight:  Usual Weight: ~ 246lbs (30 lb wt loss over 3 months)  Vital Signs: Temp: 98 F (36.7 C) (11/04 0400) Temp Source: Oral (11/04 0400) Pulse Rate: 102 (11/04 0500) Intake/Output from previous day: 11/03 0701 - 11/04 0700 In: 2885 [I.V.:2425; TPN:460] Out: 1085 [Urine:675; Drains:235; Blood:175] Intake/Output from this shift: Total I/O In: -  Out: 125 [Emesis/NG output:100; Drains:25] Labs:  Recent Labs  09/09/14 0651 09/10/14 0625 09/10/14 1501 09/11/14 0400  WBC 9.6  --  12.3* 26.3*  HGB 10.6*  --  9.8* 9.9*  HCT 31.4*  --  28.9* 29.5*  PLT 375  --  414* 389  APTT  --  34  --   --   INR  --  1.08  --   --     Recent Labs  09/09/14 0651 09/10/14 0625 09/11/14 0400  NA 137 137 136*  K 4.2 4.0 4.9  CL 98 99 98  CO2 29 29 29   GLUCOSE 185* 190* 318*  BUN 9 9 17   CREATININE 0.65 0.52 0.61  CALCIUM 8.5 8.2* 8.2*  MG 1.9  --   --   PHOS 3.2  --   --   PROT 5.4*  --   --   ALBUMIN 2.0*  --   --   AST 10  --   --   ALT 11  --   --   ALKPHOS 44  --   --   BILITOT <0.2*  --   --   PREALBUMIN 10.3*  --   --   TRIG 96  --   --    Estimated Creatinine Clearance: 103.1 mL/min (by C-G formula based on Cr of 0.61).    Recent Labs  09/10/14 1643 09/10/14 2119 09/11/14 0329  GLUCAP 312* 289* 305*   Medical History: Past Medical History  Diagnosis Date  . 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 Diabetes mellitus   . CAD (coronary artery disease)   . Personal history of other malignant neoplasm of skin   . Atrial fibrillation   . 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  Colonoscopy reveals severe UC and proctitis with friable strictures.  Biopsies consistent with UC and not malignancy. General surgery consulted and plan is for proctocolectomy and J-pouch.  Pharmacy asked to start TPN to improve nutritional status for  surgery. History of diabetes on glimepiride prior to admission, glimepiride held, using Novolog correction scale  Significant events:  10/30: TNA started at 40 ml/hr 11/1: advance TNA to 60 m/hr, removed insulin from TNA and added Lantus BID 11/3: total colectomy with ileostomy  Today (11/4):   Glucose - CBGs uncontrolled post-op, Lantus dose not given 11/3 am as in OR.  Elevated CBGs due to stress and increase TPN rate last night  Electrolytes - K = 4.9 (ULN)  Renal - SCr WNL, I/O = + 1.8L   LFTs - WNL  TGs - 96 (11/2)  Prealbumin - 12.7 (10/30), 10.3 (11/2)  NUTRITIONAL GOALS                                                                                             RD recs: 2200-2400 kcals, 115-130gm protein Clinimix 5/15 at a goal rate of 18400ml/hr + 20% fat emulsion at 8510ml/hr to provide: 120g/day protein, 2184Kcal/day.  PLAN                                                                                                                         At 1800 today:  Due to elevated CBGs, continue Clinimix E5/15 at 75 ml/hr.  .  20% fat emulsion at 3910ml/hr.  TNA to contain standard multivitamins and trace elements.  Continue  IVF at 4440ml/hr per [facility] (IVF to TPN = 12725ml/hr per [facility])  Change moderate SSI/CBGs to q4h  Increase Lantus to 25 units BID.  TNA lab panels on Mondays & Thursdays.  F/u daily.  Juliette Alcideustin Keil Pickering, PharmD, BCPS.   Pager:  [phone] 09/11/2014 9:11 AM

## 2014-09-11 NOTE — Plan of Care (Deleted)
Problem: Acute Rehab PT Goals(only PT should resolve) Goal: Pt/caregiver will Perform Home Exercise Program Outcome: Not Applicable Date Met:  15/86/82
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
## 2014-09-11 NOTE — Progress Notes (Signed)
NUTRITION FOLLOW UP  Intervention:   -TPN per pharmacy; advance to goal as tolerated d/t decreased appetite and ongoing loose stools -Diet advancement per MD -RD to continue to follow  Nutrition Dx:   Inadequate oral intake related to nausea/abd pain as evidenced by PO intake < 75% for 2 months, 30 lb weight loss in 3 months; ongoing  Goal:   Pt to meet >/= 90% of their estimated nutrition needs; not met    Monitor:   TPN tolerance, diet order, labs, weights, GI profile,   Assessment:   10/27: -Pt reported decreased appetite since 07/2014. Noted feelings of nausea, abd pain and loose stools that have inhibited intake  -Pt did not provide specific food items on diet recall, reported that he consumed small amount of "junk food" during the day as he was not able to tolerate full meals. Has tried Ensure/Boost drinks before, but was unable to tolerate  -Endorsed weight loss. Reported usual body weight of 250 lb; indicating approximately 35 lb weight loss in 3 months (14% body weight loss, severe for time frame)  -Pt NPO upon admit, and diet advanced to clear liquids. Pt had not yet received lunch tray during RD assessment.  -Continues with loose bloody stools. C.diff negative. Plan to undergo colonoscopy for further eval  -Had questions regarding appropriate diet for colitis/UC, requested RD follow up when diet advanced to solid foods  -Would likely benefit from addition of low fiber nutrition supplement-Carnation Instant Breakfast for nutrient replenishment when diet advanced to [facility] or solid  10/30: -Diet advanced to Full liquid diet, consuming approximately 50% of meals. Pt reported tolerating pudding, ice cream, chicken broth and [place] ices. Abd pain/nausea improved post meals -MD noted biopsy indicates UC, no malignancy. Will need surgery, likely proctocolectomy with j-pouch vs ileostomy  -Pt declining supplements at this time; however was willing to trial MagicCup supplements -Declined  nutrition education at this time, will monitor needs -Encouraged protein sources as tolerated for weight management and muscle maintenance  10/30: -Received consult for TPN -Discussed pt with pharmacy; noted that TPN likely initiated to improve nutritional status prior to surgery -Pt no longer poses severe refeeding risk as he has improved PO intake for past several days during admit. K WNL -Will continue to monitor supplement tolerance (magiccup) and protein intake; pt advanced to soft diet after RD follow up and now has more options available vs liquid diets -TPN does not likely need to meet >90% of estimated nutrition needs as pt with fair appetite(50% PO + poss supplement) and want to prevent decreasing appetite by meeting kcal needs with TPN  11/02: -Pt advanced to Soft diet, appetite decreased d/t loose stools and abd pain. PO intake 20-25%. Enjoys MagicCup, and has been consuming BID with meals.  -Continues with loose stools, C.diff negative. Encouraged pt increase intake of BRAT diet to assist with loose stools (bananas, rice, applesauce, toast etc) -NPO on 11/03 for ileostomy -Clinimix 5/15 infusing at 60 ml/hr with 20% lipids at 10 ml/hr for pre op supplemental nutrition. Provides 1502 kcal (68% est kcal needs) 72 gram protein (63% est protein needs) -Unable to advance TPN to goal rate of 100 ml/hr d/t elevated CBG's -Phos/K/Mg WNL  11/04: -Pt s/p ileostomy on 11/03 -Surgery recommended to continue with NPO status and to remove NGT. NGT w/100 ml output per RN documentation - Clinimix 5/15 infusing at 75 ml/hr with lipids 20% at 10 ml/hr. Providing 1758 kcal (80% est kcal needs) and 90 gram protein (78% est  protein needs). Unable to advance to goal rate d/t elevated CBGs (>300) -Phos/K/Mg WNL -Weight increased 8 lb likely d/t lower extremity edema  Height: Ht Readings from Last 1 Encounters:  09/10/14 6' (1.829 m)    Weight Status:   Wt Readings from Last 1 Encounters:   09/10/14 224 lb 6.9 oz (101.8 kg)  09/02/14 216 lb  Re-estimated needs:  Kcal: 2200-2400  Protein: 115-130 gram  Fluid: >/= 2200 ml daily    Skin: non-pitting RLE and LLE edema  Diet Order: Diet NPO time specified TPN (CLINIMIX-E) Adult TPN (CLINIMIX-E) Adult    Intake/Output Summary (Last 24 hours) at 09/11/14 1327 Last data filed at 09/11/14 1224  Gross per 24 hour  Intake   1885 ml  Output   1180 ml  Net    705 ml    Last BM: 11/03; loose stool   Labs:   Recent Labs Lab 09/06/14 0105 09/07/14 0355 09/09/14 0651 09/10/14 0625 09/11/14 0400  NA 136* 137 137 137 136*  K 4.1 3.7 4.2 4.0 4.9  CL 100 100 98 99 98  CO2 _0 BUN _1 CREATININE 0.82 0.68 0.65 0.52 0.61  CALCIUM 8.2* 8.3* 8.5 8.2* 8.2*  MG 1.9 1.8 1.9  --   --   PHOS  --  3.2 3.2  --   --   GLUCOSE 130* 145* 185* 190* 318*    CBG (last 3)   Recent Labs  09/11/14 0329 09/11/14 1020 09/11/14 1216  GLUCAP 305* 218* 227*    Scheduled Meds: . antiseptic oral rinse  7 mL Mouth Rinse BID  . [START ON 09/12/2014] becaplermin   Topical Q7 days  . heparin subcutaneous  5,000 Units Subcutaneous 3 times per day  . HYDROmorphone PCA 0.3 mg/mL   Intravenous 6 times per day  . insulin aspart  0-15 Units Subcutaneous 6 times per day  . insulin glargine  25 Units Subcutaneous BID  . pantoprazole (PROTONIX) IV  40 mg Intravenous Q24H  . sodium chloride  10-40 mL Intracatheter Q12H    Continuous Infusions: . sodium chloride 40 mL/hr at 09/11/14 0500  . Marland KitchenTPN (CLINIMIX-E) Adult 75 mL/hr at 09/11/14 0500   And  . fat emulsion 250 mL (09/10/14 1801)  . Marland KitchenTPN (CLINIMIX-E) Adult     And  . fat emulsion      Atlee Abide MS RD LDN Clinical Dietitian [phone]

## 2014-09-11 NOTE — Progress Notes (Signed)
Physical Therapy Re-evaluation Patient Details Name: Lorenso QuarryWilliam M Stepp MRN: [id number] DOB: Aug 04, 1941 Today's Date: 09/11/2014    History of Present Illness 73 y.o. male with h/o ulcerative colitis, DM, CAD, a fib, chronic diarrhea, chronic LLE wound admitted with bloody stools, abdominal pain and significant weight loss. Dx of colon stricture. 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 Re-eval today-09/11/14    PT Comments    On re-eval, pt requires Mod assist +2 for mobility-able to perform stand pivot from bed to recliner with RW. Pt is now requiring increased assistance since last session (eval) so recommendation [facility]   Follow Up Recommendations  SNF     Equipment Recommendations  None recommended by PT    Recommendations for Other Services OT consult     Precautions / Restrictions Precautions Precautions: Fall Precaution Comments: chronic wound L heel, pt wears compression stocking. Multiple lines/leads, drain.     Mobility  Bed Mobility Overal bed mobility: Needs Assistance Bed Mobility: Supine to Sit     Supine to sit: HOB elevated;Mod assist;+2 for physical assistance;+2 for safety/equipment     General bed mobility comments: HOB ~60 degrees. Increased time. Multimodal cues for safety, technique. Pt is very HOH  Transfers Overall transfer level: Needs assistance Equipment used: Rolling walker (2 wheeled) Transfers: Sit to/from [employer]GI CorporationStand;Stand Pivot Transfers Sit to Stand: Mod assist;+2 physical assistance;+2 safety/equipment;From elevated surface Stand pivot transfers: Mod assist;+2 physical assistance;+2 safety/equipment       General transfer comment: Assist to rise, stabilize, control descent.Multimodal cues for safety, technique, hand placement  Ambulation/Gait             General Gait Details: NT   Stairs            Wheelchair Mobility    Modified Rankin (Stroke Patients Only)       Balance Overall  balance assessment: Needs assistance Sitting-balance support: Bilateral upper extremity supported;Feet supported Sitting balance-Leahy Scale: Fair     Standing balance support: Bilateral upper extremity supported;During functional activity Standing balance-Leahy Scale: Poor                      Cognition Arousal/Alertness: Awake/alert Behavior During Therapy: WFL for tasks assessed/performed Overall Cognitive Status: Within Functional Limits for tasks assessed (very HOH)                      Exercises      General Comments        Pertinent Vitals/Pain Pain Assessment: 0-10 Pain Score: 8  Pain Location: abdomen Pain Intervention(s): PCA encouraged;Repositioned    Home Living                      Prior Function            PT Goals (current goals can now be found in the care plan section) Progress towards PT goals: Progressing toward goals    Frequency  Min 3X/week    PT Plan Discharge plan needs to be updated    Co-evaluation             End of Session Equipment Utilized During Treatment: Oxygen Activity Tolerance: Patient limited by pain;Patient limited by fatigue Patient left: in chair;with call bell/phone within reach     Time: 1224-1252 PT Time Calculation (min): 28 min  Charges:  $Therapeutic Activity: 8-22 mins  G Codes:      Weston Anna, MPT Pager: [phone]

## 2014-09-11 NOTE — Progress Notes (Signed)
1 Day Post-Op  Subjective: Some pain, otherwise doing fine  Objective: Vital signs in last 24 hours: Temp:  [97.6 F (36.4 C)-98.4 F (36.9 C)] 98 F (36.7 C) (11/04 0400) Pulse Rate:  [37-110] 102 (11/04 0500) Resp:  [0-27] 0 (11/04 0500) BP: (129-147)/(57-84) 129/57 mmHg (11/03 1615) SpO2:  [97 %-100 %] 98 % (11/04 0500) Arterial Line BP: (124-165)/(55-61) 127/61 mmHg (11/03 1615) Weight:  [224 lb 6.9 oz (101.8 kg)] 224 lb 6.9 oz (101.8 kg) (11/03 1615) Last BM Date: 09/10/14  Intake/Output from previous day: 11/03 0701 - 11/04 0700 In: 2885 [I.V.:2425; TPN:460] Out: 1085 [Urine:675; Drains:235; Blood:175] Intake/Output this shift:    General appearance: no distress Resp: diminished breath sounds bibasilar Cardio: irregularly irregular rhythm GI: approp tender no bs ileostomy present  Lab Results:   Recent Labs  09/10/14 1501 09/11/14 0400  WBC 12.3* 26.3*  HGB 9.8* 9.9*  HCT 28.9* 29.5*  PLT 414* 389   BMET  Recent Labs  09/10/14 0625 09/11/14 0400  NA 137 136*  K 4.0 4.9  CL 99 98  CO2 29 29  GLUCOSE 190* 318*  BUN 9 17  CREATININE 0.52 0.61  CALCIUM 8.2* 8.2*   PT/INR  Recent Labs  09/10/14 0625  LABPROT 14.2  INR 1.08    Anti-infectives: Anti-infectives    Start     Dose/Rate Route Frequency Ordered Stop   09/10/14 2200  cefoTEtan (CEFOTAN) 2 g in dextrose 5 % 50 mL IVPB     2 g100 mL/hr over 30 Minutes Intravenous Every 12 hours 09/10/14 1644 09/11/14 2159   09/10/14 0600  cefoTEtan (CEFOTAN) 2 g in dextrose 5 % 50 mL IVPB     2 g100 mL/hr over 30 Minutes Intravenous On call to O.R. 09/09/14 1321 09/10/14 1142      Assessment/Plan: POD 1 tac/ileostomy  1. Continue pca 2. Pulmonary toilet 3. afib chronic, do not want to start ac for at least 48 hours after surgery, will have cards see again for recs given gi bleed is now gone 4. Npo, dc ng tube, bid dressing changes, continue tna 5. Sq heparin, protonix 6. Pt consult 7. Stay in  stepdown until tomorrow  [facility]KE[facility],Ronald Mayo 09/11/2014

## 2014-09-12 ENCOUNTER — Inpatient Hospital Stay (HOSPITAL_COMMUNITY): Payer: Medicare PPO

## 2014-09-12 DIAGNOSIS — I481 Persistent atrial fibrillation: Secondary | ICD-10-CM

## 2014-09-12 DIAGNOSIS — I25709 Atherosclerosis of coronary artery bypass graft(s), unspecified, with unspecified angina pectoris: Secondary | ICD-10-CM

## 2014-09-12 LAB — COMPREHENSIVE METABOLIC PANEL
ALT: 12 U/L (ref 0–53)
AST: 9 U/L (ref 0–37)
Albumin: 1.4 g/dL — ABNORMAL LOW (ref 3.5–5.2)
Alkaline Phosphatase: 61 U/L (ref 39–117)
Anion gap: 6 (ref 5–15)
BUN: 20 mg/dL (ref 6–23)
CALCIUM: 8.5 mg/dL (ref 8.4–10.5)
CO2: 32 meq/L (ref 19–32)
Chloride: 100 mEq/L (ref 96–112)
Creatinine, Ser: 0.66 mg/dL (ref 0.50–1.35)
GLUCOSE: 170 mg/dL — AB (ref 70–99)
Potassium: 4.8 mEq/L (ref 3.7–5.3)
SODIUM: 138 meq/L (ref 137–147)
Total Bilirubin: <0.2 mg/dL — ABNORMAL LOW (ref 0.3–1.2)
Total Protein: 5 g/dL — ABNORMAL LOW (ref 6.0–8.3)

## 2014-09-12 LAB — GLUCOSE, CAPILLARY
GLUCOSE-CAPILLARY: 202 mg/dL — AB (ref 70–99)
Glucose-Capillary: 111 mg/dL — ABNORMAL HIGH (ref 70–99)
Glucose-Capillary: 164 mg/dL — ABNORMAL HIGH (ref 70–99)
Glucose-Capillary: 168 mg/dL — ABNORMAL HIGH (ref 70–99)
Glucose-Capillary: 176 mg/dL — ABNORMAL HIGH (ref 70–99)
Glucose-Capillary: 183 mg/dL — ABNORMAL HIGH (ref 70–99)
Glucose-Capillary: 192 mg/dL — ABNORMAL HIGH (ref 70–99)

## 2014-09-12 LAB — CBC
HCT: 26 % — ABNORMAL LOW (ref 39.0–52.0)
Hemoglobin: 8.5 g/dL — ABNORMAL LOW (ref 13.0–17.0)
MCH: 31 pg (ref 26.0–34.0)
MCHC: 32.7 g/dL (ref 30.0–36.0)
MCV: 94.9 fL (ref 78.0–100.0)
PLATELETS: 361 10*3/uL (ref 150–400)
RBC: 2.74 MIL/uL — ABNORMAL LOW (ref 4.22–5.81)
RDW: 13.7 % (ref 11.5–15.5)
WBC: 27.5 10*3/uL — ABNORMAL HIGH (ref 4.0–10.5)

## 2014-09-12 LAB — MAGNESIUM: Magnesium: 2.2 mg/dL (ref 1.5–2.5)

## 2014-09-12 LAB — PHOSPHORUS: PHOSPHORUS: 2.9 mg/dL (ref 2.3–4.6)

## 2014-09-12 LAB — PRO B NATRIURETIC PEPTIDE: Pro B Natriuretic peptide (BNP): 116.2 pg/mL (ref 0–125)

## 2014-09-12 MED ORDER — BECAPLERMIN 0.01 % EX GEL
CUTANEOUS | Status: DC
  Administered 2014-09-18 – 2014-09-25 (×2): via TOPICAL

## 2014-09-12 MED ORDER — FAT EMULSION 20 % IV EMUL
250.0000 mL | INTRAVENOUS | Status: AC
  Administered 2014-09-12: 250 mL via INTRAVENOUS
  Filled 2014-09-12: qty 250

## 2014-09-12 MED ORDER — ALTEPLASE 2 MG IJ SOLR
2.0000 mg | Freq: Once | INTRAMUSCULAR | Status: AC
  Administered 2014-09-12: 2 mg
  Filled 2014-09-12: qty 2

## 2014-09-12 MED ORDER — SODIUM CHLORIDE 0.9 % IV BOLUS (SEPSIS)
500.0000 mL | Freq: Once | INTRAVENOUS | Status: AC
  Administered 2014-09-12: 500 mL via INTRAVENOUS

## 2014-09-12 MED ORDER — HYDROMORPHONE HCL 1 MG/ML IJ SOLN
1.0000 mg | Freq: Once | INTRAMUSCULAR | Status: AC
  Administered 2014-09-12: 1 mg via INTRAVENOUS
  Filled 2014-09-12 (×2): qty 1

## 2014-09-12 MED ORDER — HYDROMORPHONE HCL 1 MG/ML IJ SOLN
1.0000 mg | Freq: Once | INTRAMUSCULAR | Status: AC
  Administered 2014-09-12: 1 mg via INTRAVENOUS
  Filled 2014-09-12: qty 1

## 2014-09-12 MED ORDER — TRACE MINERALS CR-CU-F-FE-I-MN-MO-SE-ZN IV SOLN
INTRAVENOUS | Status: AC
  Administered 2014-09-12: 23:00:00 via INTRAVENOUS
  Filled 2014-09-12: qty 3000

## 2014-09-12 MED ORDER — SODIUM CHLORIDE 0.9 % IV SOLN
INTRAVENOUS | Status: DC
  Administered 2014-09-13 (×2): via INTRAVENOUS
  Administered 2014-09-28: 10 mL/h via INTRAVENOUS
  Administered 2014-09-29: 22:00:00 via INTRAVENOUS
  Administered 2014-10-01: 10 mL via INTRAVENOUS

## 2014-09-12 MED ORDER — BECAPLERMIN 0.01 % EX GEL
CUTANEOUS | Status: DC
  Administered 2014-09-12: 12:00:00 via TOPICAL

## 2014-09-12 MED ORDER — INSULIN GLARGINE 100 UNIT/ML ~~LOC~~ SOLN
5.0000 [IU] | Freq: Two times a day (BID) | SUBCUTANEOUS | Status: DC
  Administered 2014-09-13 – 2014-09-17 (×11): 5 [IU] via SUBCUTANEOUS
  Filled 2014-09-12 (×13): qty 0.05

## 2014-09-12 NOTE — Progress Notes (Signed)
PARENTERAL NUTRITION CONSULT NOTE   Pharmacy Consult for TPN Indication: Severe ulcerative colitis and malnutrition  No Known Allergies  Patient Measurements: Height: 6' (182.9 cm) Weight: 224 lb 6.9 oz (101.8 kg) IBW/kg (Calculated) : 77.6 Adjusted Body Weight:  Usual Weight: ~ 246lbs (30 lb wt loss over 3 months)  Vital Signs: Temp: 99.2 F (37.3 C) (11/05 0400) Temp Source: Oral (11/05 0400) BP: 125/61 mmHg (11/05 0600) Pulse Rate: 114 (11/05 0600) Intake/Output from previous day: 11/04 0701 - 11/05 0700 In: 2332.4 [I.V.:1238.7; TPN:1093.8] Out: 1115 [Urine:880; Emesis/NG output:100; Drains:135] Intake/Output from this shift:   Labs:  Recent Labs  09/10/14 0625 09/10/14 1501 09/11/14 0400 09/12/14 0520  WBC  --  12.3* 26.3* 27.5*  HGB  --  9.8* 9.9* 8.5*  HCT  --  28.9* 29.5* 26.0*  PLT  --  414* 389 361  APTT 34  --   --   --   INR 1.08  --   --   --     Recent Labs  09/10/14 0625 09/11/14 0400 09/12/14 0520  NA 137 136* 138  K 4.0 4.9 4.8  CL 99 98 100  CO2 29 29 32  GLUCOSE 190* 318* 170*  BUN 9 17 20   CREATININE 0.52 0.61 0.66  CALCIUM 8.2* 8.2* 8.5  MG  --   --  2.2  PHOS  --   --  2.9  PROT  --   --  5.0*  ALBUMIN  --   --  1.4*  AST  --   --  9  ALT  --   --  12  ALKPHOS  --   --  61  BILITOT  --   --  <0.2*   Estimated Creatinine Clearance: 103.1 mL/min (by C-G formula based on Cr of 0.66).    Recent Labs  09/12/14 0020 09/12/14 0412 09/12/14 0754  GLUCAP 192* 164* 168*   Medical History: Past Medical History  Diagnosis Date  . 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 Diabetes mellitus   . CAD (coronary artery disease)   . Personal history of other malignant neoplasm of skin   . Atrial fibrillation   . 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  Colonoscopy reveals severe UC and proctitis with friable strictures.  Biopsies consistent with UC and not malignancy. General surgery consulted and plan is for proctocolectomy and J-pouch.  Pharmacy asked to start TPN to improve nutritional status for surgery. History of diabetes on glimepiride prior to admission, glimepiride held, using Novolog correction scale  Significant events:  10/30: TNA started  at 40 ml/hr 11/1: advance TNA to 60 m/hr, removed insulin from TNA and added Lantus BID 11/3: total colectomy with ileostomy  Today (11/4):   Glucose - CBGs better controlled last 12h, changed to q4h SSI and inc lantus to 25 units BID. Suspect elevated CBGs 11/4 due to stress from surgery and increase TPN 11/3pm  Electrolytes - Corr Ca = 10.58 (ULN), Ca x Phos = 31  Renal - SCr WNL, I/O = + 1.3L, NGT = 100ml, JP = 135ml  LFTs - WNL  TGs - 96 (11/2)  Prealbumin - 12.7 (10/30), 10.3 (11/2)  NUTRITIONAL GOALS                                                                                             RD recs: 2200-2400 kcals, 115-130gm protein Clinimix 5/15 at a goal rate of 16100ml/hr + 20% fat emulsion at 2710ml/hr to provide: 120g/day protein, 2184Kcal/day.  PLAN                                                                                                                         At 1800 today:  CBGs better controlled so will increase Clinimix E5/15 to goal 16500ml/hr.  Due to concern of increasing lantus insulin requirements, prefer to reduce lantus dose and add regular insulin in TPN.  Concerned if TPN were to be stopped unexpectedly will still have effects of long-acting insulin to potentially causes hypoglycemia.  Patient was not previously on insulin   Decrease lantus 5  units BID  Add 60 units regular insulin to 3L TPN bag (20 units/L) - to deliver 48 units insulin/24h  20% fat emulsion at 4110ml/hr.  TNA to contain standard multivitamins and trace elements.  With increase TPN rate, reduce  MIVF to KVO (IVF to TPN = 15825ml/hr per [facility])  Continue moderate SSI/CBGs to q4h  TNA lab panels on Mondays & Thursdays.  Electrolytes in am due to rate change  F/u daily.  Juliette Alcideustin Declan Adamson, PharmD, BCPS.   Pager: [phone] 09/12/2014 9:19 AM

## 2014-09-12 NOTE — Progress Notes (Signed)
Patient ID: Ronald Mayo, male   DOB: 11/12/1940, 73 y.o.   MRN: [id number] 2 Days Post-Op  Subjective: Pt c/o abdominal pain.  Says i'm going to kill him if I make him get up and walk around today.    Objective: Vital signs in last 24 hours: Temp:  [98.2 F (36.8 C)-99.5 F (37.5 C)] 99.2 F (37.3 C) (11/05 0400) Pulse Rate:  [97-114] 114 (11/05 0600) Resp:  [0-26] 17 (11/05 0756) BP: (114-160)/(54-83) 125/61 mmHg (11/05 0600) SpO2:  [95 %-98 %] 97 % (11/05 0756) Last BM Date: 09/10/14  Intake/Output from previous day: 11/04 0701 - 11/05 0700 In: 2332.4 [I.V.:1238.7; TPN:1093.8] Out: 1115 [Urine:880; Emesis/NG output:100; Drains:135] Intake/Output this shift:    PE: Gen: laying in bed in NAD, but appears frail still Heart: mildly tachy, irregular Abd: soft, tender appropriately, incision is clean and packed.  Stoma is pink and viable.  Sweat only in ostomy pouch.  JP drain with brown/milky/bloody output  Lab Results:   Recent Labs  09/11/14 0400 09/12/14 0520  WBC 26.3* 27.5*  HGB 9.9* 8.5*  HCT 29.5* 26.0*  PLT 389 361   BMET  Recent Labs  09/11/14 0400 09/12/14 0520  NA 136* 138  K 4.9 4.8  CL 98 100  CO2 29 32  GLUCOSE 318* 170*  BUN 17 20  CREATININE 0.61 0.66  CALCIUM 8.2* 8.5   PT/INR  Recent Labs  09/10/14 0625  LABPROT 14.2  INR 1.08   CMP     Component Value Date/Time   NA 138 09/12/2014 0520   K 4.8 09/12/2014 0520   CL 100 09/12/2014 0520   CO2 32 09/12/2014 0520   GLUCOSE 170* 09/12/2014 0520   BUN 20 09/12/2014 0520   CREATININE 0.66 09/12/2014 0520   CALCIUM 8.5 09/12/2014 0520   PROT 5.0* 09/12/2014 0520   ALBUMIN 1.4* 09/12/2014 0520   AST 9 09/12/2014 0520   ALT 12 09/12/2014 0520   ALKPHOS 61 09/12/2014 0520   BILITOT <0.2* 09/12/2014 0520   GFRNONAA >90 09/12/2014 0520   GFRAA >90 09/12/2014 0520   Lipase     Component Value Date/Time   LIPASE 22.0 03/03/2007 0941       Studies/Results: No results  found.  Anti-infectives: Anti-infectives    Start     Dose/Rate Route Frequency Ordered Stop   09/10/14 2200  cefoTEtan (CEFOTAN) 2 g in dextrose 5 % 50 mL IVPB     2 g100 mL/hr over 30 Minutes Intravenous Every 12 hours 09/10/14 1644 09/11/14 1130   09/10/14 0600  cefoTEtan (CEFOTAN) 2 g in dextrose 5 % 50 mL IVPB     2 g100 mL/hr over 30 Minutes Intravenous On call to O.R. 09/09/14 1321 09/10/14 1142       Assessment/Plan  POD 1 tac/ileostomy 2. PCM/TNA 3. Chronic a fib  Plan: 1. Cont PCA for pain control 2. PT/OT eval 3. Mobilize and pulm toilet 4. Appreciate cardiology input.  Will look at restarting anticoagulation this weekend if hgb remains stable 5. Watch JP drain output.  It has changed some today and is more concerning given his elevated WBC, which still may be mostly reactive from his major surgery. 6. Cont abx therapy 7. 500cc NS bolus 8. Remain in SDU 9. Cont NPO as he c/o some nausea today.   LOS: 10 days    Ronald Mayo 09/12/2014, 8:03 AM Pager: [phone]

## 2014-09-12 NOTE — Progress Notes (Addendum)
Clinical Social Work Department BRIEF PSYCHOSOCIAL ASSESSMENT 09/13/2014  Patient:  Ronald Mayo, Ronald Mayo     Account Number:  [id number]     Admit date:  09/02/2014  Clinical Social Worker:  Ulyess Blossom  Date/Time:  09/12/2014 02:30 PM  Referred by:  Physician  Date Referred:  09/12/2014 Referred for  SNF Placement   Other Referral:   Interview type:  Patient Other interview type:   and patient daughter at bedside    PSYCHOSOCIAL DATA Living Status:  ALONE Admitted from facility:   Level of care:   Primary support name:  Claiborne Billings Walker/daughter/[phone] Primary support relationship to patient:  CHILD, ADULT Degree of support available:   strong    CURRENT CONCERNS Current Concerns  Post-Acute Placement   Other Concerns:    SOCIAL WORK ASSESSMENT / PLAN CSW received referral for New SNF.    CSW met with pt and pt daughter at bedside. Pt is hard of hearing, but pt daughter very supportive and is able to communicate well with pt. Pt daughter discussed that pt lives with wife at home, but pt daughter recogonizes need for higher level of care before returning home. CSW expressed understanding and discussed that recommendation from PT is for SNF upon discharge. Pt daughter discussed with pt and pt agreeable. Pt daughter shared that she would like for pt placement to be in [place], [place] and interested in [facility]. CSW discussed process of SNF placement and clarified questions.    CSW completed FL2 and initiated SNF search to facilities in [place], [place]. Barriers to discharge may be that pt is currently on TNA, but pt daughter reports that MD is hopeful that pt will be able to start progressing his diet. CSW contacted [facility] and confirmed facility received referral.    CSW to follow up with pt and pt daughter re: SNF bed offers.    Pt has [employer] Medicare which requires authorization prior to pt d/c to SNF.    CSW to continue to follow to provide support and assist  with pt discharge planning needs.   Assessment/plan status:  Psychosocial Support/Ongoing Assessment of Needs Other assessment/ plan:   discharge planning   Information/referral to community resources:   Referral to SNF facilities in [place], [place]    PATIENT'S/FAMILY'S RESPONSE TO PLAN OF CARE: Pt alert and oriented x 4, however very hard of hearing, but pt daughter able to speak to pt loudly and pt agreeable to SNF for rehab. Pt daughter supportive and actively involved in pt care. Pt daughter aware that CSW will likely have to continue to update SNF facilities as pt progresses medically.     Alison Murray, MSW, [facility] [phone]

## 2014-09-12 NOTE — Plan of Care (Signed)
Problem: Discharge Progression Outcomes Goal: Pain controlled with appropriate interventions Outcome: Progressing  Comments:  Patient using Dilaudid PCA as needed. Pt sleeping comfortably. Pain level 4/10
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
## 2014-09-12 NOTE — Progress Notes (Signed)
Peripherally Inserted Central Catheter/Midline Placement  The IV Nurse has discussed with the patient and/or persons authorized to consent for the patient, the purpose of this procedure and the potential benefits and risks involved with this procedure.  The benefits include less needle sticks, lab draws from the catheter and patient may be discharged home with the catheter.  Risks include, but not limited to, infection, bleeding, blood clot (thrombus formation), and puncture of an artery; nerve damage and irregular heat beat.  Alternatives to this procedure were also discussed.  PICC/Midline Placement Documentation  PICC / Midline Double Lumen 09/06/14 PICC Right Basilic 40 cm 0 cm (Active)  Indication for Insertion or Continuance of Line Administration of hyperosmolar/irritating solutions (i.e. TPN, Vancomycin, etc.) 09/12/2014 10:00 AM  Exposed Catheter (cm) 0 cm 09/06/2014  6:03 PM  Site Assessment Clean;Dry;Intact 09/12/2014  4:12 PM  Lumen #1 Status Flushed 09/12/2014  4:12 PM  Lumen #2 Status Infusing 09/12/2014  4:12 PM  Dressing Type Transparent 09/12/2014  4:12 PM  Dressing Status Clean;Dry;Intact 09/12/2014  4:12 PM  Line Care Connections checked and tightened 09/12/2014 10:00 AM  Dressing Intervention New dressing 09/06/2014  6:03 PM  Dressing Change Due 09/14/14 09/12/2014 10:00 AM       Lisabeth DevoidGibbs, Ardelia Wrede Jeanette 09/12/2014, 6:52 PM

## 2014-09-12 NOTE — Progress Notes (Signed)
Subjective: No CP Breathing is OK   Objective: Filed Vitals:   09/12/14 0400 09/12/14 0500 09/12/14 0600 09/12/14 0756  BP:   125/61   Pulse:   114   Temp: 99.2 F (37.3 C)     TempSrc: Oral     Resp:  14 2 17   Height:      Weight:      SpO2:  98% 98% 97%   Weight change:   Intake/Output Summary (Last 24 hours) at 09/12/14 0837 Last data filed at 09/12/14 [id number]  Gross per 24 hour  Intake 2247.44 ml  Output    990 ml  Net 1257.44 ml    General: Alert, awake, oriented x3, in no acute distress Neck:  JVP is diffiuclt to assess   Heart: Irregular rate and rhythm, without murmurs, rubs, gallops.  Lungs  REl clear   Exemities:  Tr   edema.   Neuro: Grossly intact, nonfocal.  I/O  23 L positive    Lab Results: Results for orders placed or performed during the hospital encounter of 09/02/14 (from the past 24 hour(s))  Glucose, capillary     Status: Abnormal   Collection Time: 09/11/14 10:20 AM  Result Value Ref Range   Glucose-Capillary 218 (H) 70 - 99 mg/dL   Comment 1 Documented in Chart    Comment 2 Notify RN   Glucose, capillary     Status: Abnormal   Collection Time: 09/11/14 12:16 PM  Result Value Ref Range   Glucose-Capillary 227 (H) 70 - 99 mg/dL  Glucose, capillary     Status: Abnormal   Collection Time: 09/11/14  4:53 PM  Result Value Ref Range   Glucose-Capillary 219 (H) 70 - 99 mg/dL  Glucose, capillary     Status: Abnormal   Collection Time: 09/11/14  8:14 PM  Result Value Ref Range   Glucose-Capillary 183 (H) 70 - 99 mg/dL   Comment 1 Documented in Chart    Comment 2 Notify RN   Glucose, capillary     Status: Abnormal   Collection Time: 09/12/14 12:20 AM  Result Value Ref Range   Glucose-Capillary 192 (H) 70 - 99 mg/dL   Comment 1 Documented in Chart    Comment 2 Notify RN   Glucose, capillary     Status: Abnormal   Collection Time: 09/12/14  4:12 AM  Result Value Ref Range   Glucose-Capillary 164 (H) 70 - 99 mg/dL  CBC     Status: Abnormal     Collection Time: 09/12/14  5:20 AM  Result Value Ref Range   WBC 27.5 (H) 4.0 - 10.5 K/uL   RBC 2.74 (L) 4.22 - 5.81 MIL/uL   Hemoglobin 8.5 (L) 13.0 - 17.0 g/dL   HCT 19.126.0 (L) 47.839.0 - 29.552.0 %   MCV 94.9 78.0 - 100.0 fL   MCH 31.0 26.0 - 34.0 pg   MCHC 32.7 30.0 - 36.0 g/dL   RDW 62.113.7 30.811.5 - 65.715.5 %   Platelets 361 150 - 400 K/uL  Comprehensive metabolic panel     Status: Abnormal   Collection Time: 09/12/14  5:20 AM  Result Value Ref Range   Sodium 138 137 - 147 mEq/L   Potassium 4.8 3.7 - 5.3 mEq/L   Chloride 100 96 - 112 mEq/L   CO2 32 19 - 32 mEq/L   Glucose, Bld 170 (H) 70 - 99 mg/dL   BUN 20 6 - 23 mg/dL   Creatinine, Ser 8.460.66 0.50 - 1.35 mg/dL  Calcium 8.5 8.4 - 10.5 mg/dL   Total Protein 5.0 (L) 6.0 - 8.3 g/dL   Albumin 1.4 (L) 3.5 - 5.2 g/dL   AST 9 0 - 37 U/L   ALT 12 0 - 53 U/L   Alkaline Phosphatase 61 39 - 117 U/L   Total Bilirubin <0.2 (L) 0.3 - 1.2 mg/dL   GFR calc non Af Amer >90 >90 mL/min   GFR calc Af Amer >90 >90 mL/min   Anion gap 6 5 - 15  Magnesium     Status: None   Collection Time: 09/12/14  5:20 AM  Result Value Ref Range   Magnesium 2.2 1.5 - 2.5 mg/dL  Phosphorus     Status: None   Collection Time: 09/12/14  5:20 AM  Result Value Ref Range   Phosphorus 2.9 2.3 - 4.6 mg/dL  Glucose, capillary     Status: Abnormal   Collection Time: 09/12/14  7:54 AM  Result Value Ref Range   Glucose-Capillary 168 (H) 70 - 99 mg/dL    Studies/Results: No results found.  Medications: REviewed     @PROBHOSP @  Patient is post op day 2  Appears comfortable.   1  Atrial fibrillation  Rates fairly controlled.   WIll need to follow re options for anticoag in future.  May not be good candidate given GI issues.    2  Hx CAD    Occluded RCA in 2010  LVEF 55% at that time  No recent imaging.  No symtpoms of angna   WOuld check BNP  WIll prob need diuresis.  Echo at some pt  Not now.  Patient with discomfort in abdomen    3.  Ulcerative colitis  S/p surgery.      LOS: 10 days   Dietrich Patesaula Rayven Rettig 09/12/2014, 8:37 AM

## 2014-09-12 NOTE — Progress Notes (Signed)
09/12/14 1645  Patient PICC line has no blood return.  Paged IV team. IV team used TPA for 3 hours to try to open PICC line. IV team was unsuccessful.  Jettie PaganK. Osborne, PA was notified and ordered for PICC line to be replaced.

## 2014-09-13 LAB — GLUCOSE, CAPILLARY
GLUCOSE-CAPILLARY: 128 mg/dL — AB (ref 70–99)
GLUCOSE-CAPILLARY: 150 mg/dL — AB (ref 70–99)
Glucose-Capillary: 145 mg/dL — ABNORMAL HIGH (ref 70–99)
Glucose-Capillary: 143 mg/dL — ABNORMAL HIGH (ref 70–99)
Glucose-Capillary: 112 mg/dL — ABNORMAL HIGH (ref 70–99)

## 2014-09-13 LAB — BASIC METABOLIC PANEL
Anion gap: 9 (ref 5–15)
BUN: 17 mg/dL (ref 6–23)
CO2: 32 mEq/L (ref 19–32)
Calcium: 8.4 mg/dL (ref 8.4–10.5)
Chloride: 94 mEq/L — ABNORMAL LOW (ref 96–112)
Creatinine, Ser: 0.54 mg/dL (ref 0.50–1.35)
Glucose, Bld: 150 mg/dL — ABNORMAL HIGH (ref 70–99)
Potassium: 3.8 mEq/L (ref 3.7–5.3)
Sodium: 135 mEq/L — ABNORMAL LOW (ref 137–147)

## 2014-09-13 LAB — MAGNESIUM: MAGNESIUM: 2 mg/dL (ref 1.5–2.5)

## 2014-09-13 LAB — PROTIME-INR
INR: 1.26 (ref 0.00–1.49)
PROTHROMBIN TIME: 15.9 s — AB (ref 11.6–15.2)

## 2014-09-13 LAB — TYPE AND SCREEN
ABO/RH(D): A NEG
Antibody Screen: NEGATIVE
UNIT DIVISION: 0
Unit division: 0

## 2014-09-13 LAB — CBC
HEMATOCRIT: 22.4 % — AB (ref 39.0–52.0)
Hemoglobin: 7.5 g/dL — ABNORMAL LOW (ref 13.0–17.0)
MCH: 31 pg (ref 26.0–34.0)
MCHC: 33.5 g/dL (ref 30.0–36.0)
MCV: 92.6 fL (ref 78.0–100.0)
Platelets: 390 10*3/uL (ref 150–400)
RBC: 2.42 MIL/uL — ABNORMAL LOW (ref 4.22–5.81)
RDW: 13.4 % (ref 11.5–15.5)
WBC: 22.5 10*3/uL — ABNORMAL HIGH (ref 4.0–10.5)

## 2014-09-13 LAB — PHOSPHORUS: Phosphorus: 3.2 mg/dL (ref 2.3–4.6)

## 2014-09-13 LAB — PREPARE RBC (CROSSMATCH)

## 2014-09-13 MED ORDER — HYDROMORPHONE HCL 1 MG/ML IJ SOLN
0.5000 mg | INTRAMUSCULAR | Status: DC | PRN
  Administered 2014-09-14 (×2): 0.5 mg via INTRAVENOUS
  Filled 2014-09-13 (×2): qty 1

## 2014-09-13 MED ORDER — SODIUM CHLORIDE 0.9 % IV SOLN
Freq: Once | INTRAVENOUS | Status: AC
  Administered 2014-09-13: 08:00:00 via INTRAVENOUS

## 2014-09-13 MED ORDER — TRACE MINERALS CR-CU-F-FE-I-MN-MO-SE-ZN IV SOLN
INTRAVENOUS | Status: AC
  Administered 2014-09-13: 18:00:00 via INTRAVENOUS
  Filled 2014-09-13: qty 3000

## 2014-09-13 MED ORDER — FAT EMULSION 20 % IV EMUL
250.0000 mL | INTRAVENOUS | Status: AC
  Administered 2014-09-13: 250 mL via INTRAVENOUS
  Filled 2014-09-13: qty 250

## 2014-09-13 NOTE — Progress Notes (Signed)
PARENTERAL NUTRITION CONSULT NOTE   Pharmacy Consult for TPN Indication: Severe ulcerative colitis and malnutrition  No Known Allergies  Patient Measurements: Height: 6' (182.9 cm) Weight: 224 lb 6.9 oz (101.8 kg) IBW/kg (Calculated) : 77.6 Adjusted Body Weight:  Usual Weight: ~ 246lbs (30 lb wt loss over 3 months)  Vital Signs: Temp: 98.3 F (36.8 C) (11/06 0800) Temp Source: Oral (11/06 0800) BP: 139/58 mmHg (11/06 0600) Pulse Rate: 93 (11/06 0600) Intake/Output from previous day: 11/05 0701 - 11/06 0700 In: 2941 [I.V.:568.7; TPN:2262.3] Out: 1300 [Urine:1100; Drains:140; Stool:60] Intake/Output from this shift:   Labs:  Recent Labs  09/11/14 0400 09/12/14 0520 09/13/14 0500  WBC 26.3* 27.5* 22.5*  HGB 9.9* 8.5* 7.5*  HCT 29.5* 26.0* 22.4*  PLT 389 361 390    Recent Labs  09/11/14 0400 09/12/14 0520 09/13/14 0610  NA 136* 138  --   K 4.9 4.8  --   CL 98 100  --   CO2 29 32  --   GLUCOSE 318* 170*  --   BUN 17 20  --   CREATININE 0.61 0.66  --   CALCIUM 8.2* 8.5  --   MG  --  2.2 2.0  PHOS  --  2.9 3.2  PROT  --  5.0*  --   ALBUMIN  --  1.4*  --   AST  --  9  --   ALT  --  12  --   ALKPHOS  --  61  --   BILITOT  --  <0.2*  --    Estimated Creatinine Clearance: 103.1 mL/min (by C-G formula based on Cr of 0.66).    Recent Labs  09/13/14 0050 09/13/14 0600 09/13/14 0726  GLUCAP 150* 145* 128*   Medical History: Past Medical History  Diagnosis Date  . 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 Diabetes mellitus   . CAD (coronary artery disease)   . Personal history of other malignant neoplasm of skin   . Atrial fibrillation   . 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  Colonoscopy reveals severe UC and proctitis with friable strictures.  Biopsies consistent with UC and not malignancy. General surgery consulted and plan is for proctocolectomy and J-pouch.  Pharmacy asked to start TPN to improve nutritional status for surgery. History of diabetes on glimepiride prior to admission, glimepiride held, using Novolog correction scale  Significant events:  10/30: TNA started at 40 ml/hr 11/1: advance TNA to 60 m/hr, removed insulin from TNA and added Lantus BID 11/3: total colectomy with ileostomy 11/5: TNA at goal of  300ml/hr  Today (11/6):   Glucose - CBGs better controlled last 24h, changed to q4h SSI and inc lantus to 25 units BID. Suspect elevated CBGs 11/4 due to stress from surgery and increase TPN 11/3pm  Electrolytes - Phos & Mag WNL, Corr Ca = 10.58 (ULN), Ca x Phos = 31(11/5)  Renal - SCr WNL, I/O = + 1.3L, NGT = 100ml, JP = 135ml  LFTs - WNL  TGs - 96 (11/2)  Prealbumin - 12.7 (10/30), 10.3 (11/2)  NUTRITIONAL GOALS                                                                                             RD recs: 2200-2400 kcals, 115-130gm protein Clinimix 5/15 at a goal rate of 13000ml/hr + 20% fat emulsion at 5010ml/hr to provide: 120g/day protein, 2184Kcal/day.  PLAN                                                                                                                         At 1800 today:  CBGs better controlled so will continue Clinimix E5/15 at goal 18100ml/hr.  Due to concern of increasing lantus insulin requirements, prefer to reduce lantus dose and add regular insulin in TPN.  Concerned if TPN were to be stopped unexpectedly will still have effects of long-acting insulin to potentially causes hypoglycemia.  Patient was not previously on insulin   Continue lantus 5 units BID  Add 60 units regular insulin to 3L  TPN bag (20 units/L) - to deliver 48 units insulin/24h  20% fat emulsion at 1510ml/hr.  TNA to contain standard multivitamins and trace elements.  keep  [facility] to TPN = 13125ml/hr per [facility])  Continue moderate SSI/CBGs to q4h  TNA lab panels on Mondays & Thursdays.  F/u daily.  Arley PhenixEllen Akshay Spang RPh 09/13/2014, 8:25 AM Pager [phone]

## 2014-09-13 NOTE — Progress Notes (Signed)
After x-ray to verify position of new PICC was instructed by Radiologist pull PICC out 6cm. PICC pulled back 6 cm and new dressing applied. Primary nurse called for portable x-ray to check to make sure catheter in proper position. Will hang TNA when results obtained. Colbert EwingGerri Rustin Erhart RN

## 2014-09-13 NOTE — Progress Notes (Signed)
Clinical Social Work Department CLINICAL SOCIAL WORK PLACEMENT NOTE 09/13/2014  Patient:  Ronald Mayo,Ronald Mayo  Account Number:  [id number] Admit date:  09/02/2014  Clinical Social Worker:  Jacelyn GripSUZANNA BYRD, LCSWA  Date/time:  09/13/2014 02:00 PM  Clinical Social Work is seeking post-discharge placement for this patient at the following level of care:   SKILLED NURSING   (*CSW will update this form in Epic as items are completed)   09/12/2014  [facility]family provided with [facility] Work's list of facilities offering this level of care within the geographic area requested by the patient (or if unable, by the patient's family).  09/12/2014  Patient/family informed of their freedom to choose among providers that offer the needed level of care, that participate in Medicare, Medicaid or managed care program needed by the patient, have an available bed and are willing to accept the patient.  09/12/2014  Patient/family informed of [facility]' ownership interest in [facility], as well as of the fact that they are under no obligation to receive care at this facility.  PASARR submitted to EDS on  PASARR number received on   FL2 transmitted to all facilities in geographic area requested by pt/family on  09/13/2014 FL2 transmitted to all facilities within larger geographic area on   Patient informed that his/her managed care company has contracts with or will negotiate with  certain facilities, including the following:     Patient/family informed of bed offers received:   Patient chooses bed at  Physician recommends and patient chooses bed at    Patient to be transferred to  on   Patient to be transferred to facility by  Patient and family notified of transfer on  Name of family member notified:    The following physician request were entered in Epic:   Additional Comments:   Loletta SpecterSuzanna Darianny Momon, MSW, LCSW Clinical Social Work [phone]

## 2014-09-13 NOTE — Progress Notes (Signed)
Physical Therapy Treatment Patient Details Name: Ronald Mayo MRN: [id number] DOB: [birth year] Today's Date: 09/13/2014    History of Present Illness 73 y.o. male with h/o ulcerative colitis, DM, CAD, a fib, chronic diarrhea, chronic LLE wound admitted with bloody stools, abdominal pain and significant weight loss. Dx of colon stricture. 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 Re-eval today-09/11/14    PT Comments    Progressing slowly with mobility. Pt was able to take a few steps in room with walker.  Continuing to report pain at rest and with activity. Will need SNF  Follow Up Recommendations  SNF     Equipment Recommendations  None recommended by PT    Recommendations for Other Services OT consult     Precautions / Restrictions Precautions Precautions: Fall Precaution Comments: chronic wound L heel, pt wears compression stocking. Multiple lines/leads, drain.     Mobility  Bed Mobility Overal bed mobility: Needs Assistance Bed Mobility: Supine to Sit;Sit to Sidelying     Supine to sit: Mod assist;+2 for physical assistance;+2 for safety/equipment;HOB elevated   Sit to sidelying: HOB elevated;Mod assist;+2 for physical assistance General bed mobility comments: HOB ~60 degrees. Increased time. Multimodal cues for safety, technique. Pt is very HOH. Assist for trunk and bil LEs today. Pt insisting on therapists  providing handhold assist for pt to pull up on  Transfers Overall transfer level: Needs assistance Equipment used: Rolling walker (2 wheeled) Transfers: Sit to/from Stand Sit to Stand: Mod assist;+2 physical assistance;+2 safety/equipment;From elevated surface            Ambulation/Gait Ambulation/Gait assistance: Mod assist;+2 physical assistance;+2 safety/equipment           General Gait Details: side steps towards HOB with walker. Pt declined sitting up in chair/ambulation.    Stairs            Wheelchair Mobility    Modified  Rankin (Stroke Patients Only)       Balance                                    Cognition Arousal/Alertness: Awake/alert Behavior During Therapy: WFL for tasks assessed/performed Overall Cognitive Status: Within Functional Limits for tasks assessed                      Exercises      General Comments        Pertinent Vitals/Pain Pain Assessment: Faces Faces Pain Scale: Hurts even more Pain Location: abdomen Pain Intervention(s): PCA encouraged;Monitored during session    Home Living                      Prior Function            PT Goals (current goals can now be found in the care plan section) Progress towards PT goals: Progressing toward goals (slowly)    Frequency  Min 3X/week    PT Plan Current plan remains appropriate    Co-evaluation             End of Session Equipment Utilized During Treatment: Oxygen Activity Tolerance: Patient limited by fatigue;Patient limited by pain Patient left: in bed;with call bell/phone within reach     Time: 1537-1601 PT Time Calculation (min): 24 min  Charges:  $Therapeutic Activity: 8-22 mins  G Codes:      Weston Anna, MPT Pager: [phone]

## 2014-09-13 NOTE — Progress Notes (Addendum)
NUTRITION FOLLOW UP  Intervention:   -TPN per pharmacy; currently at goal rate, monitor CBG's -Diet advancement per MD; will monitor supplement and diet education needs -RD to continue to follow  Nutrition Dx:   Inadequate oral intake related to nausea/abd pain as evidenced by PO intake < 75% for 2 months, 30 lb weight loss in 3 months; ongoing  Goal:   Pt to meet >/= 90% of their estimated nutrition needs; not met    Monitor:   TPN tolerance, diet order, labs, weights, GI profile,   Assessment:   10/27: -Pt reported decreased appetite since 07/2014. Noted feelings of nausea, abd pain and loose stools that have inhibited intake  -Pt did not provide specific food items on diet recall, reported that he consumed small amount of "junk food" during the day as he was not able to tolerate full meals. Has tried Ensure/Boost drinks before, but was unable to tolerate  -Endorsed weight loss. Reported usual body weight of 250 lb; indicating approximately 35 lb weight loss in 3 months (14% body weight loss, severe for time frame)  -Pt NPO upon admit, and diet advanced to clear liquids. Pt had not yet received lunch tray during RD assessment.  -Continues with loose bloody stools. C.diff negative. Plan to undergo colonoscopy for further eval  -Had questions regarding appropriate diet for colitis/UC, requested RD follow up when diet advanced to solid foods  -Would likely benefit from addition of low fiber nutrition supplement-Carnation Instant Breakfast for nutrient replenishment when diet advanced to [facility] or solid  10/30: -Diet advanced to Full liquid diet, consuming approximately 50% of meals. Pt reported tolerating pudding, ice cream, chicken broth and [place] ices. Abd pain/nausea improved post meals -MD noted biopsy indicates UC, no malignancy. Will need surgery, likely proctocolectomy with j-pouch vs ileostomy  -Pt declining supplements at this time; however was willing to trial MagicCup  supplements -Declined nutrition education at this time, will monitor needs -Encouraged protein sources as tolerated for weight management and muscle maintenance  10/30: -Received consult for TPN -Discussed pt with pharmacy; noted that TPN likely initiated to improve nutritional status prior to surgery -Pt no longer poses severe refeeding risk as he has improved PO intake for past several days during admit. K WNL -Will continue to monitor supplement tolerance (magiccup) and protein intake; pt advanced to soft diet after RD follow up and now has more options available vs liquid diets -TPN does not likely need to meet >90% of estimated nutrition needs as pt with fair appetite(50% PO + poss supplement) and want to prevent decreasing appetite by meeting kcal needs with TPN  11/02: -Pt advanced to Soft diet, appetite decreased d/t loose stools and abd pain. PO intake 20-25%. Enjoys MagicCup, and has been consuming BID with meals.  -Continues with loose stools, C.diff negative. Encouraged pt increase intake of BRAT diet to assist with loose stools (bananas, rice, applesauce, toast etc) -NPO on 11/03 for ileostomy -Clinimix 5/15 infusing at 60 ml/hr with 20% lipids at 10 ml/hr for pre op supplemental nutrition. Provides 1502 kcal (68% est kcal needs) 72 gram protein (63% est protein needs) -Unable to advance TPN to goal rate of 100 ml/hr d/t elevated CBG's -Phos/K/Mg WNL  11/04: -Pt s/p ileostomy on 11/03 -Surgery recommended to continue with NPO status and to remove NGT. NGT w/100 ml output per RN documentation - Clinimix 5/15 infusing at 75 ml/hr with lipids 20% at 10 ml/hr. Providing 1758 kcal (80% est kcal needs) and 90 gram protein (78%  est protein needs). Unable to advance to goal rate d/t elevated CBGs (>300) -Phos/K/Mg WNL -Weight increased 8 lb likely d/t lower extremity edema  11/06: -Clinimix 5/15 at goal rate of 100 ml/hr with 20% lipids at 10 mlhr, providing 2184 kcal (100% est kcal  needs), and 120 gram protein (100% est protein needs) -Discussed pt with pharm on 11/05 re insulin modification and blood glucose control. RD in agreement with PharmD that pt would likely benefit from regular insulin being added in TPN with reduction in lantus dose -CBG currently at goal, < 150 mg/dL -Surgery recommended to continue NPO status; pt with some abd pain -WOC eval on 11/05 for venous stasis ulcers on heel and left lower leg, and provided ostomy care  Height: Ht Readings from Last 1 Encounters:  09/10/14 6' (1.829 m)    Weight Status:   Wt Readings from Last 1 Encounters:  09/10/14 224 lb 6.9 oz (101.8 kg)  09/02/14 216 lb  Re-estimated needs:  Kcal: 2200-2400  Protein: 115-130 gram  Fluid: >/= 2200 ml daily    Skin: non-pitting RLE and LLE edema  Diet Order: Diet NPO time specified TPN (CLINIMIX-E) Adult TPN (CLINIMIX-E) Adult    Intake/Output Summary (Last 24 hours) at 09/13/14 0959 Last data filed at 09/13/14 0700  Gross per 24 hour  Intake 1921.01 ml  Output   1300 ml  Net 621.01 ml    Last BM: 10/26 - faint BS noted by RN  Labs:   Recent Labs Lab 09/09/14 0651  09/11/14 0400 09/12/14 0520 09/13/14 0500 09/13/14 0610  NA 137  < > 136* 138 135*  --   K 4.2  < > 4.9 4.8 3.8  --   CL 98  < > 98 100 94*  --   CO2 29  < > 29 32 32  --   BUN 9  < > '17 20 17  ' --   CREATININE 0.65  < > 0.61 0.66 0.54  --   CALCIUM 8.5  < > 8.2* 8.5 8.4  --   MG 1.9  --   --  2.2  --  2.0  PHOS 3.2  --   --  2.9  --  3.2  GLUCOSE 185*  < > 318* 170* 150*  --   < > = values in this interval not displayed.  CBG (last 3)   Recent Labs  09/13/14 0050 09/13/14 0600 09/13/14 0726  GLUCAP 150* 145* 128*    Scheduled Meds: . antiseptic oral rinse  7 mL Mouth Rinse BID  . [START ON 09/18/2014] becaplermin   Topical Q7 days  . heparin subcutaneous  5,000 Units Subcutaneous 3 times per day  . HYDROmorphone PCA 0.3 mg/mL   Intravenous 6 times per day  . insulin  aspart  0-15 Units Subcutaneous 6 times per day  . insulin glargine  5 Units Subcutaneous BID  . metoprolol  2.5 mg Intravenous 4 times per day  . pantoprazole (PROTONIX) IV  40 mg Intravenous Q24H  . sodium chloride  10-40 mL Intracatheter Q12H    Continuous Infusions: . sodium chloride 10 mL/hr at 09/13/14 0103  . Marland KitchenTPN (CLINIMIX-E) Adult 100 mL/hr at 09/12/14 2245   And  . fat emulsion 250 mL (09/12/14 2248)  . Marland KitchenTPN (CLINIMIX-E) Adult     And  . fat emulsion      Atlee Abide MS RD LDN Clinical Dietitian [phone]

## 2014-09-13 NOTE — Progress Notes (Signed)
Patient ID: Ronald Mayo, male   DOB: 02-14-1941, 73 y.o.   MRN: [id number]    SUBJECTIVE:  Patient is stable this morning. He is very hard of hearing.   Filed Vitals:   09/13/14 0200 09/13/14 0300 09/13/14 0557 09/13/14 0600  BP: 119/63 119/56  139/58  Pulse: 96 90  93  Temp:    98.7 F (37.1 C)  TempSrc:    Oral  Resp: 17 16 17 21   Height:      Weight:      SpO2: 97% 94% 96% 97%     Intake/Output Summary (Last 24 hours) at 09/13/14 0628 Last data filed at 09/13/14 0600  Gross per 24 hour  Intake 2971.01 ml  Output   1280 ml  Net 1691.01 ml    LABS: Basic Metabolic Panel:  Recent Labs  40/98/11 0400 09/12/14 0520  NA 136* 138  K 4.9 4.8  CL 98 100  CO2 29 32  GLUCOSE 318* 170*  BUN 17 20  CREATININE 0.61 0.66  CALCIUM 8.2* 8.5  MG  --  2.2  PHOS  --  2.9   Liver Function Tests:  Recent Labs  09/12/14 0520  AST 9  ALT 12  ALKPHOS 61  BILITOT <0.2*  PROT 5.0*  ALBUMIN 1.4*   No results for input(s): LIPASE, AMYLASE in the last 72 hours. CBC:  Recent Labs  09/10/14 1501 09/11/14 0400 09/12/14 0520  WBC 12.3* 26.3* 27.5*  NEUTROABS 10.0*  --   --   HGB 9.8* 9.9* 8.5*  HCT 28.9* 29.5* 26.0*  MCV 92.9 93.7 94.9  PLT 414* 389 361   Cardiac Enzymes: No results for input(s): CKTOTAL, CKMB, CKMBINDEX, TROPONINI in the last 72 hours. BNP: Invalid input(s): POCBNP D-Dimer: No results for input(s): DDIMER in the last 72 hours. Hemoglobin A1C: No results for input(s): HGBA1C in the last 72 hours. Fasting Lipid Panel: No results for input(s): CHOL, HDL, LDLCALC, TRIG, CHOLHDL, LDLDIRECT in the last 72 hours. Thyroid Function Tests: No results for input(s): TSH, T4TOTAL, T3FREE, THYROIDAB in the last 72 hours.  Invalid input(s): FREET3  RADIOLOGY: Dg Chest 1 View  09/12/2014   CLINICAL DATA:  PICC placement.  EXAM: CHEST - 1 VIEW 9:37 p.m.  COMPARISON:  09/12/2014 at 6:57 p.m.  FINDINGS: PICC tip is now  17 mm below the carina in good  position.  Heart size and pulmonary vascularity are normal. Minimal atelectasis at the left lung base. Lungs are otherwise clear.  No acute osseous abnormality. Old deformity of the proximal right humerus.  IMPRESSION: PICC in good position.  Minimal atelectasis at the left lung base.   Electronically Signed   By: Geanie Cooley M.D.   On: 09/12/2014 22:01   Ct Abdomen Pelvis W Contrast  09/04/2014   CLINICAL DATA:  Ulcerative colitis with intestinal obstruction K51.912 (ICD-10-CM). 73 year old male with past medical history of diabetes, coronary artery disease, history of atrial fibrillation, history of PE, history of syncope and ulcerative colitis present with exacerbation of ulcerative colitis and found to be in a-fibeval for abd pain and colitis  EXAM: CT ABDOMEN AND PELVIS WITH CONTRAST  TECHNIQUE: Multidetector CT imaging of the abdomen and pelvis was performed using the standard protocol following bolus administration of intravenous contrast.  CONTRAST:  50mL OMNIPAQUE IOHEXOL 300 MG/ML SOLN, OMNIPAQUE IOHEXOL 300 MG/ML SOLN  COMPARISON:  Plain films 01/13/2009.  Most recent CT of 03/14/2007  FINDINGS: Lower chest: Clear lung bases. Mild cardiomegaly. Right coronary artery  atherosclerosis. No pericardial or pleural effusion.  Hepatobiliary: Minimal exclusion of the hepatic dome. Mild hepatic steatosis suspected. No focal liver lesion. Focal steatosis adjacent the falciform ligament. Normal gallbladder, without biliary ductal dilatation.  Pancreas: Mild pancreatic atrophy. A tiny cystic focus in the pancreatic body on image 22 of series 2. Alternatively, this could represent interdigitation of peripancreatic fat. Appearance was likely present on the prior exam, suggesting a benign etiology.  Spleen: Normal  Adrenals/Urinary Tract: Normal adrenal glands. Bilateral renal cysts and too small to characterize lesions. No hydronephrosis. Normal ureters and urinary bladder.  Stomach/Bowel: Normal stomach,  without wall thickening. Mild to moderate wall thickening involving the sigmoid and rectum. The descending colon is underdistended, without definite inflammation. There is also wall thickening involving the transverse colon. Hyperemia as evidenced by prominence of the Vasa recta. Ascending colon, terminal ileum, and appendix all normal. Normal small bowel without abdominal ascites.  Vascular/Lymphatic: Moderate aortic and branch vessel atherosclerosis. No retroperitoneal or retrocrural adenopathy. No pelvic adenopathy.  Reproductive:  Mild prostatomegaly.  Other: No significant free fluid. Fat containing left inguinal hernia.  Musculoskeletal: No acute osseous abnormality. 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  IMPRESSION: 1. Left-sided colitis, likely representing active ulcerative colitis. Infectious could look similar. Ischemia felt unlikely, given distribution and clinical history. 2. Atherosclerosis, including within the coronary arteries. 3. Hepatic steatosis.   Electronically Signed   By: Jeronimo GreavesKyle  Talbot M.D.   On: 09/04/2014 17:06   Dg Chest Port 1 View  09/12/2014   CLINICAL DATA:  Confirm PICC line placement.  EXAM: PORTABLE CHEST - 1 VIEW  COMPARISON:  09/06/2014  FINDINGS: There is a right arm PICC line. The catheter tip appears to be in the right atrium. The catheter could be pulled back 6 cm for positioning in the lower SVC. Low lung volumes. No focal airspace disease. Heart size is stable.  IMPRESSION: PICC line tip in the right atrium.   Electronically Signed   By: Richarda OverlieAdam  Henn M.D.   On: 09/12/2014 19:15   Dg Chest Port 1 View  09/06/2014   CLINICAL DATA:  73 year old male status post PICC line placement. Initial encounter.  EXAM: PORTABLE CHEST - 1 VIEW  COMPARISON:  CT Abdomen and Pelvis 09/04/2014.  FINDINGS: Portable AP semi upright view at 1553 hrs. Right side PICC line placed, tip projects just below the carina. Normal cardiac size and mediastinal  contours. Visualized tracheal air column is within normal limits. No pneumothorax. Somewhat low lung volumes. Mild increased interstitial markings, stable. Otherwise Allowing for portable technique, the lungs are clear.  IMPRESSION: 1. Right PICC line placed, tip at the SVC level. 2.  No acute cardiopulmonary abnormality.   Electronically Signed   By: Augusto GambleLee  Hall M.D.   On: 09/06/2014 16:21    PHYSICAL EXAM  Patient is lying flat in bed. He is oriented to person time and place. He is hard of hearing. Cardiac exam reveals an S1 and S2. I did not examine his abdomen. Lungs reveal scattered rhonchi.   TELEMETRY: I have reviewed telemetry today September 13, 2014. There is atrial fibrillation. The rate is controlled.   ASSESSMENT AND PLAN:    Ulcerative colitis with intestinal obstruction      He is recovering from his surgery.    Coronary atherosclerosis     Coronary disease is stable. No further workup at this time.    Chronic atrial fibrillation     Atrial fibrillation is chronic. The rate is controlled. The  patient had been on Coumadin in the remote past. It is my understanding that this was stopped over time for bleeding. He tells me that he has not been on it recently. Ultimately it will be best for him to be back on anticoagulation if his overall bleeding risk has been reduced by this surgery. I'm not sure that we can make this decision over the next few days. It may be best to decide this later after he is stable as an outpatient.  Ronald Mayo 09/13/2014 6:28 AM

## 2014-09-13 NOTE — Plan of Care (Signed)
Problem: Phase II Progression Outcomes Goal: Discharge plan established Outcome: Progressing     

## 2014-09-13 NOTE — Progress Notes (Signed)
Patient ID: Ronald Mayo, male   DOB: 26-Dec-1940, 73 y.o.   MRN: [id number] 3 Days Post-Op TAC Subjective: Pt c/o abdominal pain.  Having some dizziness upon standing  Objective: Vital signs in last 24 hours: Temp:  [98.2 F (36.8 C)-99.6 F (37.6 C)] 98.7 F (37.1 C) (11/06 0600) Pulse Rate:  [90-119] 93 (11/06 0600) Resp:  [0-33] 21 (11/06 0600) BP: (116-143)/(52-66) 139/58 mmHg (11/06 0600) SpO2:  [92 %-98 %] 97 % (11/06 0600) Last BM Date: 09/02/14  Intake/Output from previous day: 11/05 0701 - 11/06 0700 In: 2941 [I.V.:568.7; TPN:2262.3] Out: 1300 [Urine:1100; Drains:140; Stool:60] Intake/Output this shift:    PE: Gen: laying in bed in NAD, but appears frail still Heart: mildly tachy, irregular Abd: soft, tender appropriately, incision is clean and packed.  Stoma is pink and viable.  Sweat only in ostomy pouch.  JP drain with purulent output  Lab Results:   Recent Labs  09/12/14 0520 09/13/14 0500  WBC 27.5* 22.5*  HGB 8.5* 7.5*  HCT 26.0* 22.4*  PLT 361 390   BMET  Recent Labs  09/11/14 0400 09/12/14 0520  NA 136* 138  K 4.9 4.8  CL 98 100  CO2 29 32  GLUCOSE 318* 170*  BUN 17 20  CREATININE 0.61 0.66  CALCIUM 8.2* 8.5   PT/INR No results for input(s): LABPROT, INR in the last 72 hours. CMP     Component Value Date/Time   NA 138 09/12/2014 0520   K 4.8 09/12/2014 0520   CL 100 09/12/2014 0520   CO2 32 09/12/2014 0520   GLUCOSE 170* 09/12/2014 0520   BUN 20 09/12/2014 0520   CREATININE 0.66 09/12/2014 0520   CALCIUM 8.5 09/12/2014 0520   PROT 5.0* 09/12/2014 0520   ALBUMIN 1.4* 09/12/2014 0520   AST 9 09/12/2014 0520   ALT 12 09/12/2014 0520   ALKPHOS 61 09/12/2014 0520   BILITOT <0.2* 09/12/2014 0520   GFRNONAA >90 09/12/2014 0520   GFRAA >90 09/12/2014 0520   Lipase     Component Value Date/Time   LIPASE 22.0 03/03/2007 0941       Studies/Results: Dg Chest 1 View  09/12/2014   CLINICAL DATA:  PICC placement.  EXAM: CHEST -  1 VIEW 9:37 p.m.  COMPARISON:  09/12/2014 at 6:57 p.m.  FINDINGS: PICC tip is now  17 mm below the carina in good position.  Heart size and pulmonary vascularity are normal. Minimal atelectasis at the left lung base. Lungs are otherwise clear.  No acute osseous abnormality. Old deformity of the proximal right humerus.  IMPRESSION: PICC in good position.  Minimal atelectasis at the left lung base.   Electronically Signed   By: Geanie CooleyJim  Maxwell M.D.   On: 09/12/2014 22:01   Dg Chest Port 1 View  09/12/2014   CLINICAL DATA:  Confirm PICC line placement.  EXAM: PORTABLE CHEST - 1 VIEW  COMPARISON:  09/06/2014  FINDINGS: There is a right arm PICC line. The catheter tip appears to be in the right atrium. The catheter could be pulled back 6 cm for positioning in the lower SVC. Low lung volumes. No focal airspace disease. Heart size is stable.  IMPRESSION: PICC line tip in the right atrium.   Electronically Signed   By: Richarda OverlieAdam  Henn M.D.   On: 09/12/2014 19:15    Anti-infectives: Anti-infectives    Start     Dose/Rate Route Frequency Ordered Stop   09/10/14 2200  cefoTEtan (CEFOTAN) 2 g in dextrose 5 % 50  mL IVPB     2 g100 mL/hr over 30 Minutes Intravenous Every 12 hours 09/10/14 1644 09/11/14 1130   09/10/14 0600  cefoTEtan (CEFOTAN) 2 g in dextrose 5 % 50 mL IVPB     2 g100 mL/hr over 30 Minutes Intravenous On call to O.R. 09/09/14 1321 09/10/14 1142       Assessment/Plan  POD 1 tac/ileostomy 2. PCM/TNA 3. Chronic a fib  Plan: 1. Cont PCA for pain control, dilaudid bolus added for breakthrough 2. PT/OT eval 3. Mobilize and pulm toilet 4. Appreciate cardiology input.  Will look at restarting anticoagulation this weekend if hgb remains stable 5. Watch JP drain output.  Appears to have a slight stump leak. 6. Cont abx therapy 7. Cont NPO 8. Hgb dropped to 7.5.  Symptomatic, will transfuse 1 unit 9. Check coags   LOS: 11 days    Payson Crumby C. 09/13/2014, 7:57 AM

## 2014-09-13 NOTE — Evaluation (Addendum)
Occupational Therapy Evaluation Patient Details Name: Ronald Mayo Capell MRN: [id number] DOB: 21-Nov-1940 Today's Date: 09/13/2014    History of Present Illness 73 y.o. male with h/o ulcerative colitis, DM, CAD, a fib, chronic diarrhea, chronic LLE wound admitted with bloody stools, abdominal pain and significant weight loss. Dx of colon stricture. 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    Clinical Impression   Pt was admitted for colitis and underwent colectomy/ileostomy.  He was limited by pain during evaluation, but he was very motivated to participate.  ADLs are limited by multiple lines and pain.  He will benefit from skilled OT to increase safety and independence with adls.  Goals are for overall min A but he will need A x 2 for any ambulation related to ADLs for safety    Follow Up Recommendations  SNF    Equipment Recommendations  None by OT--pt with colectomy/ileostomy    Recommendations for Other Services       Precautions / Restrictions Precautions Precautions: Fall Precaution Comments: chronic wound L heel, pt wears compression stocking. Multiple lines/leads, drain.  Restrictions Weight Bearing Restrictions: No      Mobility Bed Mobility Overal bed mobility: Needs Assistance Bed Mobility: Supine to Sit;Sit to Sidelying     Supine to sit: Mod assist;+2 for physical assistance;+2 for safety/equipment;HOB elevated   Sit to sidelying: HOB elevated;Mod assist;+2 for physical assistance General bed mobility comments: HOB ~60 degrees. Increased time. Multimodal cues for safety, technique. Pt is very HOH. Assist for trunk and bil LEs today. Pt insisting on therapists  providing handhold assist for pt to pull up on  Transfers Overall transfer level: Needs assistance Equipment used: Rolling walker (2 wheeled) Transfers: Sit to/from Stand Sit to Stand: Mod assist;+2 physical assistance;+2 safety/equipment;From elevated surface              Balance    Sitting-balance support: Bilateral upper extremity supported;Feet supported Sitting balance-Leahy Scale: Fair     Standing balance support: Bilateral upper extremity supported Standing balance-Leahy Scale: Poor                              ADL Overall ADL's : Needs assistance/impaired     Grooming: Minimal assistance;Sitting (due to lines)   Upper Body Bathing: Moderate assistance;Sitting   Lower Body Bathing: +2 for physical assistance;Maximal assistance;Sit to/from stand   Upper Body Dressing : Moderate assistance;Sitting   Lower Body Dressing: +2 for physical assistance;Total assistance;Sit to/from stand                 General ADL Comments: Pt agreeable to therapy.  Pt's multiple lines which impair adls.  Pt had pain and did not want to try to get up to chair this session.  He was able to [facility] extra time and assist x 2 for lines/safety     Vision                     Perception     Praxis      Pertinent Vitals/Pain Pain Assessment: Faces Faces Pain Scale: Hurts even more Pain Location: abdomen Pain Intervention(s): PCA encouraged;Limited activity within patient's tolerance;Monitored during session;Repositioned     Hand Dominance     Extremity/Trunk Assessment Upper Extremity Assessment Upper Extremity Assessment: Overall WFL for tasks assessed (functionally; did not MMT due to abdomen sx)           Communication Communication Communication:  HOH   Cognition Arousal/Alertness: Awake/alert Behavior During Therapy: WFL for tasks assessed/performed Overall Cognitive Status: Within Functional Limits for tasks assessed                     General Comments       Exercises       Shoulder Instructions      Home Living Family/patient expects to be discharged to:: Unsure                                        Prior Functioning/Environment Level of Independence: Independent with  assistive device(s)        Comments: used cane intermittently    OT Diagnosis: Generalized weakness;Acute pain   OT Problem List: Decreased strength;Decreased activity tolerance;Impaired balance (sitting and/or standing);Decreased knowledge of use of DME or AE;Pain   OT Treatment/Interventions: Self-care/ADL training;DME and/or AE instruction;Balance training;Patient/family education;energy conservation    OT Goals(Current goals can be found in the care plan section) Acute Rehab OT Goals Patient Stated Goal: return to baseline OT Goal Formulation: With patient Time For Goal Achievement: 09/27/14 Potential to Achieve Goals: Fair ADL Goals Pt Will Perform Grooming: with min assist;standing Pt Will Perform Upper Body Bathing: with min guard assist;sitting Pt Will Perform Lower Body Bathing: with min assist;with adaptive equipment;sit to/from stand Additional ADL Goal #1: pt will perform bed mobility with min A in preparation for adls  OT Frequency: Min 2X/week   Barriers to D/C:            Co-evaluation PT/OT/SLP Co-Evaluation/Treatment: Yes Reason for Co-Treatment: For patient/therapist safety PT goals addressed during session: Mobility/safety with mobility OT goals addressed during session: Strengthening/ROM      End of Session    Activity Tolerance: Patient tolerated treatment well Patient left: in bed;with call bell/phone within reach   Time: 1537-1601 OT Time Calculation (min): 24 min Charges:  OT General Charges $OT Visit: 1 Procedure OT Evaluation $Initial OT Evaluation Tier I: 1 Procedure OT Treatments $Therapeutic Activity: 8-22 mins G-Codes:    Takeem Krotzer 09/13/2014, 4:56 PM  Marica OtterMaryellen Koven Belinsky, OTR/L [phone] 09/13/2014

## 2014-09-13 NOTE — Progress Notes (Signed)
CSW continuing to follow for disposition planning.  CSW received phone call from RN stating that pt daughter called and asking to speak with CSW.  CSW contacted pt daughter, [name] via telephone. Pt daughter discussed that she toured [facility] today and facility had not yet received pt referral. CSW discussed with pt daughter that CSW had to clarify some of pt medical needs with RN, but in process of sending referral. Pt daughter expressed understanding. CSW discussed with pt daughter that if pt remains on TNA then this may be a barrier to placement as there are limited facilities that provide TNA nutrition. Pt daughter states that she is hopeful that pt will be able to progress to tolerate a diet and not require TNA upon discharge.   CSW clarified with RN the questions this CSW had regarding pt medical needs and initiated SNF search. CSW contacted [facility] and confirmed facility received referral.  CSW to follow up with pt daughter re: SNF bed offers, but CSW will likely have to continue to re-initiate search as pt progresses medically.  CSW to continue to follow to provide support and assist with pt disposition planning.  Loletta SpecterSuzanna Kidd, MSW, LCSW Clinical Social Work [phone]

## 2014-09-14 LAB — CBC
HCT: 28 % — ABNORMAL LOW (ref 39.0–52.0)
HEMOGLOBIN: 9.5 g/dL — AB (ref 13.0–17.0)
MCH: 30.9 pg (ref 26.0–34.0)
MCHC: 33.9 g/dL (ref 30.0–36.0)
MCV: 91.2 fL (ref 78.0–100.0)
Platelets: 365 10*3/uL (ref 150–400)
RBC: 3.07 MIL/uL — ABNORMAL LOW (ref 4.22–5.81)
RDW: 13.9 % (ref 11.5–15.5)
WBC: 20.8 10*3/uL — ABNORMAL HIGH (ref 4.0–10.5)

## 2014-09-14 LAB — BASIC METABOLIC PANEL
Anion gap: 8 (ref 5–15)
BUN: 15 mg/dL (ref 6–23)
CALCIUM: 8.2 mg/dL — AB (ref 8.4–10.5)
CHLORIDE: 94 meq/L — AB (ref 96–112)
CO2: 32 mEq/L (ref 19–32)
Creatinine, Ser: 0.54 mg/dL (ref 0.50–1.35)
GFR calc Af Amer: >90 mL/min (ref >90)
GFR calc non Af Amer: >90 mL/min (ref >90)
GLUCOSE: 170 mg/dL — AB (ref 70–99)
POTASSIUM: 4.2 meq/L (ref 3.7–5.3)
SODIUM: 134 meq/L — AB (ref 137–147)

## 2014-09-14 LAB — GLUCOSE, CAPILLARY
GLUCOSE-CAPILLARY: 141 mg/dL — AB (ref 70–99)
GLUCOSE-CAPILLARY: 130 mg/dL — AB (ref 70–99)
GLUCOSE-CAPILLARY: 166 mg/dL — AB (ref 70–99)
GLUCOSE-CAPILLARY: 186 mg/dL — AB (ref 70–99)
Glucose-Capillary: 147 mg/dL — ABNORMAL HIGH (ref 70–99)
Glucose-Capillary: 148 mg/dL — ABNORMAL HIGH (ref 70–99)
Glucose-Capillary: 175 mg/dL — ABNORMAL HIGH (ref 70–99)

## 2014-09-14 MED ORDER — ALUM & MAG HYDROXIDE-SIMETH 200-200-20 MG/5ML PO SUSP: 30.0000 mL | Freq: Four times a day (QID) | ORAL | Status: DC | PRN

## 2014-09-14 MED ORDER — HYDROMORPHONE HCL 1 MG/ML IJ SOLN
1.0000 mg | INTRAMUSCULAR | Status: DC | PRN
  Administered 2014-09-14: 1 mg via INTRAVENOUS
  Filled 2014-09-14: qty 1

## 2014-09-14 MED ORDER — DIPHENHYDRAMINE HCL 12.5 MG/5ML PO ELIX: 12.5000 mg | ORAL_SOLUTION | Freq: Four times a day (QID) | ORAL | Status: DC | PRN

## 2014-09-14 MED ORDER — LACTATED RINGERS IV BOLUS (SEPSIS): 1000.0000 mL | Freq: Three times a day (TID) | INTRAVENOUS | Status: AC | PRN

## 2014-09-14 MED ORDER — ONDANSETRON HCL 4 MG/2ML IJ SOLN: 4.0000 mg | Freq: Four times a day (QID) | INTRAMUSCULAR | Status: DC | PRN

## 2014-09-14 MED ORDER — NALOXONE HCL 0.4 MG/ML IJ SOLN: 0.4000 mg | INTRAMUSCULAR | Status: DC | PRN

## 2014-09-14 MED ORDER — HYDROMORPHONE 0.3 MG/ML IV SOLN: INTRAVENOUS | Status: DC

## 2014-09-14 MED ORDER — SODIUM CHLORIDE 0.9 % IJ SOLN: 9.0000 mL | INTRAMUSCULAR | Status: DC | PRN

## 2014-09-14 MED ORDER — FAT EMULSION 20 % IV EMUL
250.0000 mL | INTRAVENOUS | Status: AC
  Administered 2014-09-14: 250 mL via INTRAVENOUS
  Filled 2014-09-14: qty 250

## 2014-09-14 MED ORDER — PHENOL 1.4 % MT LIQD
2.0000 | OROMUCOSAL | Status: DC | PRN
Start: 2014-09-14 — End: 2014-10-01

## 2014-09-14 MED ORDER — MENTHOL 3 MG MT LOZG: 1.0000 | LOZENGE | OROMUCOSAL | Status: DC | PRN

## 2014-09-14 MED ORDER — CITALOPRAM HYDROBROMIDE 20 MG PO TABS
20.0000 mg | ORAL_TABLET | Freq: Every day | ORAL | Status: DC
  Administered 2014-09-14 – 2014-10-01 (×16): 20 mg via ORAL
  Filled 2014-09-14 (×17): qty 1

## 2014-09-14 MED ORDER — M.V.I. ADULT IV INJ
INTRAVENOUS | Status: AC
  Administered 2014-09-14: 17:00:00 via INTRAVENOUS
  Filled 2014-09-14: qty 3000

## 2014-09-14 MED ORDER — LIP MEDEX EX OINT
1.0000 "application " | TOPICAL_OINTMENT | Freq: Two times a day (BID) | CUTANEOUS | Status: DC
  Administered 2014-09-14 – 2014-10-01 (×33): 1 via TOPICAL
  Filled 2014-09-14 (×3): qty 7

## 2014-09-14 MED ORDER — MAGIC MOUTHWASH
15.0000 mL | Freq: Four times a day (QID) | ORAL | Status: DC | PRN
  Administered 2014-09-18: 15 mL via ORAL
  Filled 2014-09-14 (×2): qty 15

## 2014-09-14 MED ORDER — DIPHENHYDRAMINE HCL 50 MG/ML IJ SOLN: 12.5000 mg | Freq: Four times a day (QID) | INTRAMUSCULAR | Status: DC | PRN

## 2014-09-14 MED ORDER — ONDANSETRON HCL 4 MG/2ML IJ SOLN
4.0000 mg | Freq: Four times a day (QID) | INTRAMUSCULAR | Status: DC | PRN
  Administered 2014-09-16 – 2014-09-17 (×2): 4 mg via INTRAVENOUS
  Filled 2014-09-14 (×2): qty 2

## 2014-09-14 MED ORDER — ACETAMINOPHEN 500 MG PO TABS
1000.0000 mg | ORAL_TABLET | Freq: Three times a day (TID) | ORAL | Status: DC
  Administered 2014-09-14 – 2014-09-18 (×11): 1000 mg via ORAL
  Filled 2014-09-14 (×21): qty 2

## 2014-09-14 MED ORDER — HYDROMORPHONE 0.3 MG/ML IV SOLN
INTRAVENOUS | Status: DC
  Administered 2014-09-14: 1.49 mg via INTRAVENOUS
  Administered 2014-09-14: 0.5 mg via INTRAVENOUS
  Administered 2014-09-14: 4 mg via INTRAVENOUS
  Administered 2014-09-14: 23:00:00 via INTRAVENOUS
  Administered 2014-09-14: 3.99 mg via INTRAVENOUS
  Administered 2014-09-15: 6.99 mg via INTRAVENOUS
  Administered 2014-09-15: 3.49 mg via INTRAVENOUS
  Administered 2014-09-15: 18:00:00 via INTRAVENOUS
  Administered 2014-09-15: 1.99 mg via INTRAVENOUS
  Administered 2014-09-16: 1.5 mg via INTRAVENOUS
  Administered 2014-09-16: 2.21 mg via INTRAVENOUS
  Administered 2014-09-16 (×2): via INTRAVENOUS
  Administered 2014-09-16: 3.38 mg via INTRAVENOUS
  Administered 2014-09-16: 1.8 mg via INTRAVENOUS
  Administered 2014-09-16: 1.5 mg via INTRAVENOUS
  Administered 2014-09-17: 3.49 mg via INTRAVENOUS
  Administered 2014-09-17: 18:00:00 via INTRAVENOUS
  Administered 2014-09-17: 2.58 mg via INTRAVENOUS
  Administered 2014-09-17: 1.5 mg via INTRAVENOUS
  Administered 2014-09-17: 10:00:00 via INTRAVENOUS
  Administered 2014-09-18: 2.87 mg via INTRAVENOUS
  Administered 2014-09-18: 0.672 mg via INTRAVENOUS
  Administered 2014-09-18: 1.99 mg via INTRAVENOUS
  Administered 2014-09-18: 4.05 mg via INTRAVENOUS
  Administered 2014-09-18: 6.58 mg via INTRAVENOUS
  Administered 2014-09-18: 4.49 mg via INTRAVENOUS
  Administered 2014-09-19: 2.49 mg via INTRAVENOUS
  Administered 2014-09-19: 3.75 mg via INTRAVENOUS
  Administered 2014-09-19: 2.99 mg via INTRAVENOUS
  Administered 2014-09-19: 3.68 mg via INTRAVENOUS
  Administered 2014-09-19: 1.99 mg via INTRAVENOUS
  Administered 2014-09-19: 3.49 mg via INTRAVENOUS
  Administered 2014-09-19 (×2): via INTRAVENOUS
  Administered 2014-09-20: 3.49 mg via INTRAVENOUS
  Administered 2014-09-20: 1.49 mg via INTRAVENOUS
  Administered 2014-09-20: 0.5 mg via INTRAVENOUS
  Administered 2014-09-20: 05:00:00 via INTRAVENOUS
  Administered 2014-09-21: 1 mg via INTRAVENOUS
  Administered 2014-09-21: 1.49 mg via INTRAVENOUS
  Administered 2014-09-21: 2.49 mg via INTRAVENOUS
  Administered 2014-09-21: 1 mg via INTRAVENOUS
  Administered 2014-09-21: 2.43 mg via INTRAVENOUS
  Administered 2014-09-21: 2.49 mg via INTRAVENOUS
  Administered 2014-09-21: 1.74 mg via INTRAVENOUS
  Administered 2014-09-22: 05:00:00 via INTRAVENOUS
  Administered 2014-09-22 (×2): 1.99 mg via INTRAVENOUS
  Filled 2014-09-14 (×16): qty 25

## 2014-09-14 NOTE — Progress Notes (Signed)
[address] [place], [place] [address] Phone: [phone] FAX: [phone]    MIZRAIM HARMENING [id number] Jun 14, 1941  CARE TEAM:  PCP: Josem Kaufmann, MD  Outpatient Care Team: Patient Care Team: Josem Kaufmann, MD as PCP - General (Unknown Physician Specialty)  Inpatient Treatment Team: Treatment Team: Attending Provider: Nolon Nations, MD; Consulting Physician: Md Edison Pace, MD; Registered Nurse: Nadara Mode, RN; Registered Nurse: Stark Klein, RN; Consulting Physician: Troy Sine, MD; Registered Nurse: Cheree Ditto Thigpen, RN; Registered Nurse: Daralene Milch Mbemena, RN; Rounding Team: Jacolyn Reedy, MD   Subjective:  Sore - PCA fair control Transfused for Hgb7s & inc HR Staying in bed ICU RN in room  Objective:  Vital signs:  Filed Vitals:   09/14/14 0400 09/14/14 0500 09/14/14 0600 09/14/14 0800  BP: 100/28 127/67 124/69   Pulse: 104 115 105   Temp: 98.4 F (36.9 C)   97.6 F (36.4 C)  TempSrc: Oral   Oral  Resp: _0 Height:      Weight: 221 lb 12.5 oz (100.6 kg)     SpO2: 91% 98% 93%     Last BM Date: 09/02/14  Intake/Output   Yesterday:  11/06 0701 - 11/07 0700 In: 2900 [I.V.:230; Blood:30; TPN:2640] Out: 8366 [QHUTM:5465; Drains:60; Stool:50] This shift:     Bowel function:  Flatus: n  BM: n  Drain: serosanguinous  Physical Exam:  General: Pt awakens oriented x3 in no acute distress.  A little sleepy Eyes: PERRL, normal EOM.  Sclera clear.  No icterus Neuro: CN II-XII intact w/o focal sensory/motor deficits. Lymph: No head/neck/groin lymphadenopathy Psych:  No delerium/psychosis/paranoia HENT: Normocephalic, Mucus membranes moist.  No thrush Neck: Supple, No tracheal deviation Chest: No chest wall pain w good excursion CV:  Pulses intact.  Regular rhythm MS: Normal AROM mjr joints.  No obvious deformity Abdomen: Soft.  Nondistended.  Ileostomy pink.  Wound  midline clean.  Mildly tender at incisions only.  No evidence of peritonitis.  No incarcerated hernias. Ext:  SCDs BLE.  2+ edema.  No cyanosis Skin: No petechiae / purpura   Problem List:   Principal Problem:   Ulcerative colitis with intestinal obstruction Active Problems:   Coronary atherosclerosis   Chronic atrial fibrillation   GERD (gastroesophageal reflux disease)   Ulcerative colitis   Diabetes   Protein-calorie malnutrition, severe   Stricture of sigmoid colon   Assessment  Ronald Mayo  73 y.o. male  4 Days Post-Op  Procedure(s): TOTAL COLECTOMY PERMANENT ILEOSTOMY  OK   Plan:  -change PCA -follow Hgb - transfused yest.  Restart anticoag when Hgb stable x 48hrs -cont IV ABx.  Drain output better but follow.  WBC trending down.  CT scan if worse but hold off for now -TNA for ileus & severe malnutrition -VTE prophylaxis- SCDs, etc -mobilize as tolerated to help recovery - GET HIM UP!!  Adin Hector, M.D., F.A.C.S. Gastrointestinal and Minimally Invasive Surgery [facility] [address], [place], [place] [address] [phone] Main / Paging   09/14/2014   Results:   Labs: Results for orders placed or performed during the hospital encounter of 09/02/14 (from the past 48 hour(s))  Glucose, capillary     Status: Abnormal   Collection Time: 09/12/14 11:11 AM  Result Value Ref Range   Glucose-Capillary 176 (H) 70 - 99 mg/dL  Glucose, capillary     Status: Abnormal  Collection Time: 09/12/14  4:32 PM  Result Value Ref Range   Glucose-Capillary 202 (H) 70 - 99 mg/dL  Glucose, capillary     Status: Abnormal   Collection Time: 09/12/14  7:41 PM  Result Value Ref Range   Glucose-Capillary 111 (H) 70 - 99 mg/dL   Comment 1 Notify RN   Glucose, capillary     Status: Abnormal   Collection Time: 09/13/14 12:50 AM  Result Value Ref Range   Glucose-Capillary 150 (H) 70 - 99 mg/dL  CBC     Status: Abnormal    Collection Time: 09/13/14  5:00 AM  Result Value Ref Range   WBC 22.5 (H) 4.0 - 10.5 K/uL   RBC 2.42 (L) 4.22 - 5.81 MIL/uL   Hemoglobin 7.5 (L) 13.0 - 17.0 g/dL   HCT 22.4 (L) 39.0 - 52.0 %   MCV 92.6 78.0 - 100.0 fL   MCH 31.0 26.0 - 34.0 pg   MCHC 33.5 30.0 - 36.0 g/dL   RDW 13.4 11.5 - 15.5 %   Platelets 390 150 - 400 K/uL  Basic metabolic panel     Status: Abnormal   Collection Time: 09/13/14  5:00 AM  Result Value Ref Range   Sodium 135 (L) 137 - 147 mEq/L   Potassium 3.8 3.7 - 5.3 mEq/L    Comment: DELTA CHECK NOTED REPEATED TO VERIFY    Chloride 94 (L) 96 - 112 mEq/L   CO2 32 19 - 32 mEq/L   Glucose, Bld 150 (H) 70 - 99 mg/dL   BUN 17 6 - 23 mg/dL   Creatinine, Ser 0.54 0.50 - 1.35 mg/dL   Calcium 8.4 8.4 - 10.5 mg/dL   GFR calc non Af Amer >90 >90 mL/min   GFR calc Af Amer >90 >90 mL/min    Comment: (NOTE) The eGFR has been calculated using the CKD EPI equation. This calculation has not been validated in all clinical situations. eGFR's persistently <90 mL/min signify possible Chronic Kidney Disease.    Anion gap 9 5 - 15  Glucose, capillary     Status: Abnormal   Collection Time: 09/13/14  6:00 AM  Result Value Ref Range   Glucose-Capillary 145 (H) 70 - 99 mg/dL  Magnesium     Status: None   Collection Time: 09/13/14  6:10 AM  Result Value Ref Range   Magnesium 2.0 1.5 - 2.5 mg/dL  Phosphorus     Status: None   Collection Time: 09/13/14  6:10 AM  Result Value Ref Range   Phosphorus 3.2 2.3 - 4.6 mg/dL  Glucose, capillary     Status: Abnormal   Collection Time: 09/13/14  7:26 AM  Result Value Ref Range   Glucose-Capillary 128 (H) 70 - 99 mg/dL  Protime-INR     Status: Abnormal   Collection Time: 09/13/14  8:30 AM  Result Value Ref Range   Prothrombin Time 15.9 (H) 11.6 - 15.2 seconds   INR 1.26 0.00 - 1.49  Glucose, capillary     Status: Abnormal   Collection Time: 09/13/14 11:47 AM  Result Value Ref Range   Glucose-Capillary 143 (H) 70 - 99 mg/dL   Type and screen     Status: None (Preliminary result)   Collection Time: 09/13/14 12:15 PM  Result Value Ref Range   ABO/RH(D) A NEG    Antibody Screen NEG    Sample Expiration 09/16/2014    Unit Number [id number]    Blood Component Type RBC LR PHER1    Unit  division 00    Status of Unit ISSUED    Transfusion Status OK TO TRANSFUSE    Crossmatch Result Compatible    Unit Number [id number]    Blood Component Type RBC LR PHER1    Unit division 00    Status of Unit ALLOCATED    Transfusion Status OK TO TRANSFUSE    Crossmatch Result Compatible   Prepare RBC     Status: None   Collection Time: 09/13/14 12:30 PM  Result Value Ref Range   Order Confirmation ORDER PROCESSED BY BLOOD BANK   Glucose, capillary     Status: Abnormal   Collection Time: 09/13/14  4:43 PM  Result Value Ref Range   Glucose-Capillary 112 (H) 70 - 99 mg/dL   Comment 1 Documented in Chart    Comment 2 Notify RN   Glucose, capillary     Status: Abnormal   Collection Time: 09/13/14  8:01 PM  Result Value Ref Range   Glucose-Capillary 141 (H) 70 - 99 mg/dL  Glucose, capillary     Status: Abnormal   Collection Time: 09/13/14 11:54 PM  Result Value Ref Range   Glucose-Capillary 147 (H) 70 - 99 mg/dL  Glucose, capillary     Status: Abnormal   Collection Time: 09/14/14  4:15 AM  Result Value Ref Range   Glucose-Capillary 148 (H) 70 - 99 mg/dL    Imaging / Studies: Dg Chest 1 View  09/12/2014   CLINICAL DATA:  PICC placement.  EXAM: CHEST - 1 VIEW 9:37 p.m.  COMPARISON:  09/12/2014 at 6:57 p.m.  FINDINGS: PICC tip is now  17 mm below the carina in good position.  Heart size and pulmonary vascularity are normal. Minimal atelectasis at the left lung base. Lungs are otherwise clear.  No acute osseous abnormality. Old deformity of the proximal right humerus.  IMPRESSION: PICC in good position.  Minimal atelectasis at the left lung base.   Electronically Signed   By: Rozetta Nunnery M.D.   On: 09/12/2014 22:01    Dg Chest Port 1 View  09/12/2014   CLINICAL DATA:  Confirm PICC line placement.  EXAM: PORTABLE CHEST - 1 VIEW  COMPARISON:  09/06/2014  FINDINGS: There is a right arm PICC line. The catheter tip appears to be in the right atrium. The catheter could be pulled back 6 cm for positioning in the lower SVC. Low lung volumes. No focal airspace disease. Heart size is stable.  IMPRESSION: PICC line tip in the right atrium.   Electronically Signed   By: Markus Daft M.D.   On: 09/12/2014 19:15    Medications / Allergies: per chart  Antibiotics: Anti-infectives    Start     Dose/Rate Route Frequency Ordered Stop   09/10/14 2200  cefoTEtan (CEFOTAN) 2 g in dextrose 5 % 50 mL IVPB     2 g100 mL/hr over 30 Minutes Intravenous Every 12 hours 09/10/14 1644 09/11/14 1130   09/10/14 0600  cefoTEtan (CEFOTAN) 2 g in dextrose 5 % 50 mL IVPB     2 g100 mL/hr over 30 Minutes Intravenous On call to O.R. 09/09/14 1321 09/10/14 1142       Note: Portions of this report may have been transcribed using voice recognition software. Every effort was made to ensure accuracy; however, inadvertent computerized transcription errors may be present.   Any transcriptional errors that result from this process are unintentional.

## 2014-09-14 NOTE — Progress Notes (Signed)
Subjective:  Cardiac status stable overnight.  Not SOB.  No Chest pain  Objective:  Vital Signs in the last 24 hours: BP 124/69 mmHg  Pulse 105  Temp(Src) 98.4 F (36.9 C) (Oral)  Resp 19  Ht 6' (1.829 m)  Wt 100.6 kg (221 lb 12.5 oz)  BMI 30.07 kg/m2  SpO2 93%  Physical Exam: Pleasant WM in NAD HOH Lungs:  Clear  Cardiac:  irregular rhythm, normal S1 and S2, no S3, 1/6 systolic murmur Extremities:  No edema present  Intake/Output from previous day: 11/06 0701 - 11/07 0700 In: 2900 [I.V.:230; Blood:30; TPN:2640] Out: 1505 [Urine:1395; Drains:60; Stool:50] Weight Filed Weights   09/02/14 1330 09/10/14 1615 09/14/14 0400  Weight: 98.068 kg (216 lb 3.2 oz) 101.8 kg (224 lb 6.9 oz) 100.6 kg (221 lb 12.5 oz)    Lab Results: Basic Metabolic Panel:  Recent Labs  65/78/4610/03/22 0520 09/13/14 0500  NA 138 135*  K 4.8 3.8  CL 100 94*  CO2 32 32  GLUCOSE 170* 150*  BUN 20 17  CREATININE 0.66 0.54    CBC:  Recent Labs  09/12/14 0520 09/13/14 0500  WBC 27.5* 22.5*  HGB 8.5* 7.5*  HCT 26.0* 22.4*  MCV 94.9 92.6  PLT 361 390    BNP    Component Value Date/Time   PROBNP 116.2 09/12/2014 0520    PROTIME: Lab Results  Component Value Date   INR 1.26 09/13/2014   INR 1.08 2020/05/2514    Telemetry: Atrial fib with controlled response  Assessment/Plan:  1. Chronic atrial fibrillation rate controlled - not on anticoagulation due to bleeding 2. CAD treated medically stable  Rec:  Progression per surgery.   Darden PalmerW. Spencer Tilley, Jr.  MD [facility] Cardiology  09/14/2014, 7:45 AM

## 2014-09-14 NOTE — Progress Notes (Signed)
PARENTERAL NUTRITION CONSULT NOTE   Pharmacy Consult for TPN Indication: Severe ulcerative colitis and malnutrition  No Known Allergies  Patient Measurements: Height: 6' (182.9 cm) Weight: 221 lb 12.5 oz (100.6 kg) IBW/kg (Calculated) : 77.6 Adjusted Body Weight:  Usual Weight: ~ 246lbs (30 lb wt loss over 3 months)  Vital Signs: Temp: 97.6 F (36.4 C) (11/07 0800) Temp Source: Oral (11/07 0800) BP: 124/69 mmHg (11/07 0600) Pulse Rate: 105 (11/07 0600) Intake/Output from previous day: 11/06 0701 - 11/07 0700 In: 2900 [I.V.:230; Blood:30; TPN:2640] Out: 1505 [Urine:1395; Drains:60; Stool:50] Intake/Output from this shift:   Labs:  Recent Labs  09/12/14 0520 09/13/14 0500 09/13/14 0830  WBC 27.5* 22.5*  --   HGB 8.5* 7.5*  --   HCT 26.0* 22.4*  --   PLT 361 390  --   INR  --   --  1.26    Recent Labs  09/12/14 0520 09/13/14 0500 09/13/14 0610  NA 138 135*  --   K 4.8 3.8  --   CL 100 94*  --   CO2 32 32  --   GLUCOSE 170* 150*  --   BUN 20 17  --   CREATININE 0.66 0.54  --   CALCIUM 8.5 8.4  --   MG 2.2  --  2.0  PHOS 2.9  --  3.2  PROT 5.0*  --   --   ALBUMIN 1.4*  --   --   AST 9  --   --   ALT 12  --   --   ALKPHOS 61  --   --   BILITOT <0.2*  --   --    Estimated Creatinine Clearance: 102.5 mL/min (by C-G formula based on Cr of 0.54).    Recent Labs  09/13/14 2001 09/13/14 2354 09/14/14 0415  GLUCAP 141* 147* 148*   Medical History: Past Medical History  Diagnosis Date  . 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 Diabetes mellitus   . CAD (coronary artery disease)   . Personal history of other malignant neoplasm of skin   . Atrial fibrillation   . Stricture of sigmoid colon 09/04/2014   Insulin Requirements:  15 units/24 hr: moderate Novolog correction scale q4h Lantus 5 units BID  Current Nutrition:  NPO starting 11/3  IVF: NS at 2610ml/hr   Central access:  PICC placed 10/30 TPN start date: 10/30  ASSESSMENT                                                                                                           HPI: 89 YOM with known h/o ulcerative colitis.  Colonoscopy reveals severe UC and proctitis with friable strictures.  Biopsies consistent with UC and not malignancy. General surgery consulted and plan is for proctocolectomy and J-pouch.  Pharmacy asked to start TPN to improve nutritional status for surgery. History of diabetes on glimepiride prior to admission, glimepiride held, using Novolog correction scale  Significant events:  10/30: TNA started at 40 ml/hr 11/1: advance TNA to 60 m/hr,  removed insulin from TNA and added Lantus BID 11/3: total colectomy with ileostomy 11/5: TNA at goal of 16600ml/hr  Today (11/6):   Glucose - CBGs <150. Suspect elevated CBGs 11/4 due to stress from surgery and increase TPN 11/3pm  Electrolytes - Phos & Mag WNL, Corr Ca = 10.58 (ULN), Ca x Phos = 31(11/5)  Renal - SCr WNL, I/O = + 1.1L,   LFTs - WNL  TGs - 96 (11/2)  Prealbumin - 12.7 (10/30), 10.3 (11/2)  NUTRITIONAL GOALS                                                                                             RD recs: 2200-2400 kcals, 115-130gm protein Clinimix 5/15 at a goal rate of 12600ml/hr + 20% fat emulsion at 3010ml/hr to provide: 120g/day protein, 2184Kcal/day.  PLAN                                                                                                                         At 1800 today:  continue Clinimix E5/15 at goal 13000ml/hr.  Continue lantus 5 units BID  Add 60 units regular insulin to 3L TPN bag (20 units/L) - to deliver 48 units insulin/24h  20% fat emulsion at 2010ml/hr.  TNA to contain standard multivitamins and trace elements.  [facility] to TPN = 12825ml/hr per [facility])  Continue moderate SSI/CBGs to q4h  TNA lab panels on Mondays & Thursdays.  F/u daily.  Arley Phenixllen Auren Valdes RPh 09/14/2014, 9:46 AM Pager [phone]

## 2014-09-15 LAB — CBC
HCT: 27.9 % — ABNORMAL LOW (ref 39.0–52.0)
Hemoglobin: 9.5 g/dL — ABNORMAL LOW (ref 13.0–17.0)
MCH: 30.8 pg (ref 26.0–34.0)
MCHC: 34.1 g/dL (ref 30.0–36.0)
MCV: 90.6 fL (ref 78.0–100.0)
PLATELETS: 387 10*3/uL (ref 150–400)
RBC: 3.08 MIL/uL — ABNORMAL LOW (ref 4.22–5.81)
RDW: 13.8 % (ref 11.5–15.5)
WBC: 20.3 10*3/uL — ABNORMAL HIGH (ref 4.0–10.5)

## 2014-09-15 LAB — GLUCOSE, CAPILLARY
GLUCOSE-CAPILLARY: 164 mg/dL — AB (ref 70–99)
GLUCOSE-CAPILLARY: 165 mg/dL — AB (ref 70–99)
Glucose-Capillary: 175 mg/dL — ABNORMAL HIGH (ref 70–99)
Glucose-Capillary: 177 mg/dL — ABNORMAL HIGH (ref 70–99)
Glucose-Capillary: 163 mg/dL — ABNORMAL HIGH (ref 70–99)
Glucose-Capillary: 156 mg/dL — ABNORMAL HIGH (ref 70–99)

## 2014-09-15 MED ORDER — M.V.I. ADULT IV INJ
INTRAVENOUS | Status: AC
  Administered 2014-09-15: 18:00:00 via INTRAVENOUS
  Filled 2014-09-15: qty 3000

## 2014-09-15 MED ORDER — FAT EMULSION 20 % IV EMUL
250.0000 mL | INTRAVENOUS | Status: AC
  Administered 2014-09-15: 250 mL via INTRAVENOUS
  Filled 2014-09-15: qty 250

## 2014-09-15 MED ORDER — HYDROMORPHONE HCL 1 MG/ML IJ SOLN
1.0000 mg | INTRAMUSCULAR | Status: DC | PRN
  Administered 2014-09-16 (×3): 2 mg via INTRAVENOUS
  Filled 2014-09-15 (×3): qty 2

## 2014-09-15 NOTE — Progress Notes (Signed)
[address] [place], [place] [address] Phone: [phone] FAX: [phone]    Ronald Mayo [id number] 12/31/1940  CARE TEAM:  PCP: Josem Kaufmann, MD  Outpatient Care Team: Patient Care Team: Josem Kaufmann, MD as PCP - General (Unknown Physician Specialty)  Inpatient Treatment Team: Treatment Team: Attending Provider: Nolon Nations, MD; Consulting Physician: Md Edison Pace, MD; Registered Nurse: Nadara Mode, RN; Registered Nurse: Stark Klein, RN; Consulting Physician: Troy Sine, MD; Registered Nurse: Lars Masson, RN; Registered Nurse: Daralene Milch Mbemena, RN; Rounding Team: Jacolyn Reedy, MD   Subjective:  Sore - PCA fair control per pt but sleeping w button press Transfused for Hgb7s & inc HR Got to chair x 1 - hard to do Staying in bed Wants SCDs off ICU RN in room  Objective:  Vital signs:  Filed Vitals:   09/15/14 0400 09/15/14 0431 09/15/14 0500 09/15/14 0600  BP: 131/71  134/58 135/62  Pulse: 95  108 102  Temp: 97.2 F (36.2 C)     TempSrc:      Resp: '16 12 13 20  ' Height:      Weight:      SpO2: 97% 98% 98% 97%    Last BM Date: 09/02/14  Intake/Output   Yesterday:  11/07 0701 - 11/08 0700 In: 2760 [I.V.:230; TPN:2530] Out: 2150 [Urine:2000; Drains:110; Stool:40] This shift:  Total I/O In: 53 [I.V.:110; TPN:1210] Out: 3 [Urine:1100; Drains:40; Stool:20]  Bowel function:  Flatus: n  BM: n  Drain: serosanguinous  Physical Exam:  General: Pt awakens oriented x3 in no acute distress.  A little sleepy Eyes: PERRL, normal EOM.  Sclera clear.  No icterus Neuro: CN II-XII intact w/o focal sensory/motor deficits. Lymph: No head/neck/groin lymphadenopathy Psych:  No delerium/psychosis/paranoia HENT: Normocephalic, Mucus membranes moist.  No thrush Neck: Supple, No tracheal deviation Chest: No chest wall pain w good excursion CV:  Pulses intact.  Regular  rhythm MS: Normal AROM mjr joints.  No obvious deformity Abdomen: Soft.  Nondistended.  Ileostomy pink.  Serous fluid - no flatus/effluent/stool.  Wound midline clean.  Mildly tender at incisions only.  No evidence of peritonitis.  No incarcerated hernias. Ext:  SCDs BLE.  2+ edema.  No cyanosis Skin: No petechiae / purpura   Problem List:   Principal Problem:   Ulcerative colitis with intestinal obstruction Active Problems:   Coronary atherosclerosis   Chronic atrial fibrillation   GERD (gastroesophageal reflux disease)   Ulcerative colitis   Diabetes   Protein-calorie malnutrition, severe   Stricture of sigmoid colon   Assessment  Ronald Mayo  73 y.o. male  5 Days Post-Op  Procedure(s): TOTAL COLECTOMY PERMANENT ILEOSTOMY  OK   Plan:  -cont PCA.  Incr PRN breakthrough -follow Hgb - improved s/p transfused yest.  Restart anticoag when Hgb stable x 48hrs = probably tomorrow -prob stump leak with anal & ?foul JP drainage - mild & pt stabilizing.  Cont int drain & allow Hartmann per anal internal drainage. -cont IV ABx.  Drain output better but follow.  WBC trending down.  CT scan if worse but hold off for now -TNA for ileus & severe malnutrition -severe deconditioning & poor motivation major challenges - gradually trying to mobilize as tolerated  -VTE prophylaxis- SCDs, etc -mobilize as tolerated to help recovery - GET HIM UP!!  Adin Hector, M.D., F.A.C.S. Gastrointestinal and Minimally Invasive Surgery [facility] [address], [address]  [place], [place] [address] [phone] Main / Paging   09/15/2014   Results:   Labs: Results for orders placed or performed during the hospital encounter of 09/02/14 (from the past 48 hour(s))  Glucose, capillary     Status: Abnormal   Collection Time: 09/13/14  7:26 AM  Result Value Ref Range   Glucose-Capillary 128 (H) 70 - 99 mg/dL  Protime-INR     Status: Abnormal   Collection Time:  09/13/14  8:30 AM  Result Value Ref Range   Prothrombin Time 15.9 (H) 11.6 - 15.2 seconds   INR 1.26 0.00 - 1.49  Glucose, capillary     Status: Abnormal   Collection Time: 09/13/14 11:47 AM  Result Value Ref Range   Glucose-Capillary 143 (H) 70 - 99 mg/dL  Type and screen     Status: None (Preliminary result)   Collection Time: 09/13/14 12:15 PM  Result Value Ref Range   ABO/RH(D) A NEG    Antibody Screen NEG    Sample Expiration 09/16/2014    Unit Number [id number]    Blood Component Type RBC LR PHER1    Unit division 00    Status of Unit ISSUED,FINAL    Transfusion Status OK TO TRANSFUSE    Crossmatch Result Compatible    Unit Number [id number]    Blood Component Type RBC LR PHER1    Unit division 00    Status of Unit ALLOCATED    Transfusion Status OK TO TRANSFUSE    Crossmatch Result Compatible   Prepare RBC     Status: None   Collection Time: 09/13/14 12:30 PM  Result Value Ref Range   Order Confirmation ORDER PROCESSED BY BLOOD BANK   Glucose, capillary     Status: Abnormal   Collection Time: 09/13/14  4:43 PM  Result Value Ref Range   Glucose-Capillary 112 (H) 70 - 99 mg/dL   Comment 1 Documented in Chart    Comment 2 Notify RN   Glucose, capillary     Status: Abnormal   Collection Time: 09/13/14  8:01 PM  Result Value Ref Range   Glucose-Capillary 141 (H) 70 - 99 mg/dL  Glucose, capillary     Status: Abnormal   Collection Time: 09/13/14 11:54 PM  Result Value Ref Range   Glucose-Capillary 147 (H) 70 - 99 mg/dL  Glucose, capillary     Status: Abnormal   Collection Time: 09/14/14  4:15 AM  Result Value Ref Range   Glucose-Capillary 148 (H) 70 - 99 mg/dL  Glucose, capillary     Status: Abnormal   Collection Time: 09/14/14  7:41 AM  Result Value Ref Range   Glucose-Capillary 130 (H) 70 - 99 mg/dL   Comment 1 Documented in Chart    Comment 2 Notify RN   CBC     Status: Abnormal   Collection Time: 09/14/14 11:00 AM  Result Value Ref Range   WBC 20.8  (H) 4.0 - 10.5 K/uL   RBC 3.07 (L) 4.22 - 5.81 MIL/uL   Hemoglobin 9.5 (L) 13.0 - 17.0 g/dL    Comment: DELTA CHECK NOTED POST TRANSFUSION SPECIMEN    HCT 28.0 (L) 39.0 - 52.0 %   MCV 91.2 78.0 - 100.0 fL   MCH 30.9 26.0 - 34.0 pg   MCHC 33.9 30.0 - 36.0 g/dL   RDW 13.9 11.5 - 15.5 %   Platelets 365 150 - 400 K/uL  Basic metabolic panel     Status: Abnormal   Collection Time: 09/14/14 11:00  AM  Result Value Ref Range   Sodium 134 (L) 137 - 147 mEq/L   Potassium 4.2 3.7 - 5.3 mEq/L   Chloride 94 (L) 96 - 112 mEq/L   CO2 32 19 - 32 mEq/L   Glucose, Bld 170 (H) 70 - 99 mg/dL   BUN 15 6 - 23 mg/dL   Creatinine, Ser 0.54 0.50 - 1.35 mg/dL   Calcium 8.2 (L) 8.4 - 10.5 mg/dL   GFR calc non Af Amer >90 >90 mL/min   GFR calc Af Amer >90 >90 mL/min    Comment: (NOTE) The eGFR has been calculated using the CKD EPI equation. This calculation has not been validated in all clinical situations. eGFR's persistently <90 mL/min signify possible Chronic Kidney Disease.    Anion gap 8 5 - 15  Glucose, capillary     Status: Abnormal   Collection Time: 09/14/14 11:48 AM  Result Value Ref Range   Glucose-Capillary 166 (H) 70 - 99 mg/dL   Comment 1 Documented in Chart    Comment 2 Notify RN   Glucose, capillary     Status: Abnormal   Collection Time: 09/14/14  4:21 PM  Result Value Ref Range   Glucose-Capillary 186 (H) 70 - 99 mg/dL  Glucose, capillary     Status: Abnormal   Collection Time: 09/14/14  7:57 PM  Result Value Ref Range   Glucose-Capillary 175 (H) 70 - 99 mg/dL   Comment 1 Documented in Chart     Imaging / Studies: No results found.  Medications / Allergies: per chart  Antibiotics: Anti-infectives    Start     Dose/Rate Route Frequency Ordered Stop   09/10/14 2200  cefoTEtan (CEFOTAN) 2 g in dextrose 5 % 50 mL IVPB     2 g100 mL/hr over 30 Minutes Intravenous Every 12 hours 09/10/14 1644 09/11/14 1130   09/10/14 0600  cefoTEtan (CEFOTAN) 2 g in dextrose 5 % 50 mL IVPB      2 g100 mL/hr over 30 Minutes Intravenous On call to O.R. 09/09/14 1321 09/10/14 1142       Note: Portions of this report may have been transcribed using voice recognition software. Every effort was made to ensure accuracy; however, inadvertent computerized transcription errors may be present.   Any transcriptional errors that result from this process are unintentional.

## 2014-09-15 NOTE — Progress Notes (Signed)
PARENTERAL NUTRITION CONSULT NOTE   Pharmacy Consult for TPN Indication: Severe ulcerative colitis and malnutrition  No Known Allergies  Patient Measurements: Height: 6' (182.9 cm) Weight: 221 lb 12.5 oz (100.6 kg) IBW/kg (Calculated) : 77.6 Adjusted Body Weight:  Usual Weight: ~ 246lbs (30 lb wt loss over 3 months)  Vital Signs: Temp: 97.2 F (36.2 C) (11/08 0400) Temp Source: Oral (11/07 2000) BP: 135/62 mmHg (11/08 0600) Pulse Rate: 102 (11/08 0600) Intake/Output from previous day: 11/07 0701 - 11/08 0700 In: 2760 [I.V.:230; TPN:2530] Out: 2150 [Urine:2000; Drains:110; Stool:40] Intake/Output from this shift:   Labs:  Recent Labs  09/13/14 0500 09/13/14 0830 09/14/14 1100  WBC 22.5*  --  20.8*  HGB 7.5*  --  9.5*  HCT 22.4*  --  28.0*  PLT 390  --  365  INR  --  1.26  --     Recent Labs  09/13/14 0500 09/13/14 0610 09/14/14 1100  NA 135*  --  134*  K 3.8  --  4.2  CL 94*  --  94*  CO2 32  --  32  GLUCOSE 150*  --  170*  BUN 17  --  15  CREATININE 0.54  --  0.54  CALCIUM 8.4  --  8.2*  MG  --  2.0  --   PHOS  --  3.2  --    Estimated Creatinine Clearance: 102.5 mL/min (by C-G formula based on Cr of 0.54).    Recent Labs  09/14/14 1148 09/14/14 1621 09/14/14 1957  GLUCAP 166* 186* 175*   Medical History: Past Medical History  Diagnosis Date  . 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 Diabetes mellitus   . CAD (coronary artery disease)   . Personal history of other malignant neoplasm of skin   . Atrial fibrillation   . 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  Colonoscopy reveals severe UC and proctitis with friable strictures.  Biopsies consistent with UC and not malignancy. General surgery consulted and plan is for proctocolectomy and J-pouch.  Pharmacy asked to start TPN to improve nutritional status for surgery. History of diabetes on glimepiride prior to admission, glimepiride held, using Novolog correction scale  Significant events:  10/30: TNA started at 40 ml/hr 11/1: advance TNA to 60 m/hr, removed insulin from TNA and added Lantus BID 11/3: total colectomy with ileostomy 11/5: TNA at goal of 13400ml/hr  Today (11/6):   Glucose - CBGs 170.   Electrolytes - Phos & Mag WNL (11/7), Corr Ca = 10.58 (ULN), Ca x Phos = 31(11/5)  Renal - SCr  WNL, I/O = + 1.1L,   LFTs - WNL  TGs - 96 (11/2)  Prealbumin - 12.7 (10/30), 10.3 (11/2)  NUTRITIONAL GOALS                                                                                             RD recs: 2200-2400 kcals, 115-130gm protein Clinimix 5/15 at a goal rate of 15500ml/hr + 20% fat emulsion at 5910ml/hr to provide: 120g/day protein, 2184Kcal/day.  PLAN                                                                                                                         At 1800 today:  continue Clinimix E5/15 at goal 13200ml/hr.  Continue lantus 5 units BID  Increase to 65 units regular insulin to 3L TPN bag (20 units/L) - to deliver 52 units insulin/24h  20% fat emulsion at 5510ml/hr.  TNA to contain standard multivitamins and trace elements.  [facility]  [facility] ([facility]F to TPN = 12425ml/hr per [facility])  Continue moderate SSI/CBGs to q4h  TNA lab panels on Mondays & Thursdays.  F/u daily.  Arley PhenixEllen Aran Menning RPh 09/15/2014, 8:04 AM Pager [phone]

## 2014-09-15 NOTE — Progress Notes (Signed)
Subjective:   Not SOB.  No Chest pain, Making slow progress  Objective:  Vital Signs in the last 24 hours: BP 135/62 mmHg  Pulse 102  Temp(Src) 97.2 F (36.2 C) (Oral)  Resp 20  Ht 6' (1.829 m)  Wt 100.6 kg (221 lb 12.5 oz)  BMI 30.07 kg/m2  SpO2 97%  Physical Exam: Pleasant WM in NAD HOH Lungs:  Clear  Cardiac:  irregular rhythm, normal S1 and S2, no S3, 1/6 systolic murmur Extremities:  No edema present  Intake/Output from previous day: 11/07 0701 - 11/08 0700 In: 2760 [I.V.:230; TPN:2530] Out: 2150 [Urine:2000; Drains:110; Stool:40] Weight Filed Weights   09/02/14 1330 09/10/14 1615 09/14/14 0400  Weight: 98.068 kg (216 lb 3.2 oz) 101.8 kg (224 lb 6.9 oz) 100.6 kg (221 lb 12.5 oz)    Lab Results: Basic Metabolic Panel:  Recent Labs  64/40/3410/04/22 0500 09/14/14 1100  NA 135* 134*  K 3.8 4.2  CL 94* 94*  CO2 32 32  GLUCOSE 150* 170*  BUN 17 15  CREATININE 0.54 0.54    CBC:  Recent Labs  09/13/14 0500 09/14/14 1100  WBC 22.5* 20.8*  HGB 7.5* 9.5*  HCT 22.4* 28.0*  MCV 92.6 91.2  PLT 390 365    BNP    Component Value Date/Time   PROBNP 116.2 09/12/2014 0520   Telemetry: Atrial fib with controlled response  Assessment/Plan:  1. Chronic atrial fibrillation rate controlled - not on anticoagulation due to bleeding Can restart when risk of bleeding is acceptable 2. CAD treated medically stable  Rec:  Progression per surgery.   Darden PalmerW. Spencer Karolyne Timmons, Jr.  MD [facility]  09/15/2014, 8:30 AM

## 2014-09-15 NOTE — Plan of Care (Signed)
Problem: Phase II Progression Outcomes Goal: Discharge plan established Outcome: Progressing Currently looking at placement in SNIF/Rehab.

## 2014-09-15 NOTE — Progress Notes (Signed)
Pt performed resistance exercises in bed, Hand, Arm, Shoulder, Right foot, Knees and Hips. Pt did not have any weakness. Will continue during the night if pt is awake.

## 2014-09-15 NOTE — Plan of Care (Signed)
Problem: Discharge Progression Outcomes Goal: Activity appropriate for discharge plan Outcome: Progressing Pt performed both upper and lower resistance exercises in bed tonight. No weakness was noted. Will repeat as pt is awake. Bed was utilized in the chair position and supine today to help with mobility.

## 2014-09-16 ENCOUNTER — Inpatient Hospital Stay (HOSPITAL_COMMUNITY): Payer: Medicare PPO

## 2014-09-16 ENCOUNTER — Encounter (HOSPITAL_COMMUNITY): Payer: Self-pay | Admitting: Radiology

## 2014-09-16 LAB — COMPREHENSIVE METABOLIC PANEL
ALK PHOS: 81 U/L (ref 39–117)
ALT: 16 U/L (ref 0–53)
AST: 14 U/L (ref 0–37)
Albumin: 1.1 g/dL — ABNORMAL LOW (ref 3.5–5.2)
Anion gap: 8 (ref 5–15)
BUN: 16 mg/dL (ref 6–23)
CALCIUM: 8.1 mg/dL — AB (ref 8.4–10.5)
CHLORIDE: 95 meq/L — AB (ref 96–112)
CO2: 32 meq/L (ref 19–32)
Creatinine, Ser: 0.59 mg/dL (ref 0.50–1.35)
GFR calc Af Amer: >90 mL/min (ref >90)
Glucose, Bld: 146 mg/dL — ABNORMAL HIGH (ref 70–99)
Potassium: 4.5 mEq/L (ref 3.7–5.3)
SODIUM: 135 meq/L — AB (ref 137–147)
Total Bilirubin: 1.1 mg/dL (ref 0.3–1.2)
Total Protein: 5 g/dL — ABNORMAL LOW (ref 6.0–8.3)

## 2014-09-16 LAB — CBC
HEMATOCRIT: 28.2 % — AB (ref 39.0–52.0)
HEMOGLOBIN: 9.7 g/dL — AB (ref 13.0–17.0)
MCH: 31.2 pg (ref 26.0–34.0)
MCHC: 34.4 g/dL (ref 30.0–36.0)
MCV: 90.7 fL (ref 78.0–100.0)
Platelets: 429 10*3/uL — ABNORMAL HIGH (ref 150–400)
RBC: 3.11 MIL/uL — ABNORMAL LOW (ref 4.22–5.81)
RDW: 13.8 % (ref 11.5–15.5)
WBC: 23.3 10*3/uL — ABNORMAL HIGH (ref 4.0–10.5)

## 2014-09-16 LAB — PROTIME-INR
INR: 1.11 (ref 0.00–1.49)
Prothrombin Time: 14.4 seconds (ref 11.6–15.2)

## 2014-09-16 LAB — MAGNESIUM: Magnesium: 2 mg/dL (ref 1.5–2.5)

## 2014-09-16 LAB — GLUCOSE, CAPILLARY
GLUCOSE-CAPILLARY: 147 mg/dL — AB (ref 70–99)
GLUCOSE-CAPILLARY: 154 mg/dL — AB (ref 70–99)
GLUCOSE-CAPILLARY: 163 mg/dL — AB (ref 70–99)
GLUCOSE-CAPILLARY: 165 mg/dL — AB (ref 70–99)
Glucose-Capillary: 168 mg/dL — ABNORMAL HIGH (ref 70–99)
Glucose-Capillary: 165 mg/dL — ABNORMAL HIGH (ref 70–99)

## 2014-09-16 LAB — DIFFERENTIAL
BASOS PCT: 0 % (ref 0–1)
Basophils Absolute: 0 10*3/uL (ref 0.0–0.1)
Eosinophils Absolute: 0.7 10*3/uL (ref 0.0–0.7)
Eosinophils Relative: 3 % (ref 0–5)
LYMPHS PCT: 8 % — AB (ref 12–46)
Lymphs Abs: 1.9 10*3/uL (ref 0.7–4.0)
Monocytes Absolute: 1.9 10*3/uL — ABNORMAL HIGH (ref 0.1–1.0)
Monocytes Relative: 8 % (ref 3–12)
NEUTROS ABS: 18.8 10*3/uL — AB (ref 1.7–7.7)
Neutrophils Relative %: 81 % — ABNORMAL HIGH (ref 43–77)

## 2014-09-16 LAB — PHOSPHORUS: PHOSPHORUS: 4.4 mg/dL (ref 2.3–4.6)

## 2014-09-16 LAB — APTT: aPTT: 38 seconds — ABNORMAL HIGH (ref 24–37)

## 2014-09-16 LAB — HEPARIN LEVEL (UNFRACTIONATED): HEPARIN UNFRACTIONATED: 0.11 [IU]/mL — AB (ref 0.30–0.70)

## 2014-09-16 LAB — TRIGLYCERIDES: TRIGLYCERIDES: 144 mg/dL (ref <150)

## 2014-09-16 LAB — PREALBUMIN: Prealbumin: 5 mg/dL — ABNORMAL LOW (ref 17.0–34.0)

## 2014-09-16 MED ORDER — IOHEXOL 300 MG/ML  SOLN
25.0000 mL | INTRAMUSCULAR | Status: AC
  Administered 2014-09-16 (×2): 25 mL via ORAL

## 2014-09-16 MED ORDER — HEPARIN BOLUS VIA INFUSION
2000.0000 [IU] | Freq: Once | INTRAVENOUS | Status: AC
  Administered 2014-09-16: 2000 [IU] via INTRAVENOUS
  Filled 2014-09-16: qty 2000

## 2014-09-16 MED ORDER — HEPARIN (PORCINE) IN NACL 100-0.45 UNIT/ML-% IJ SOLN
1700.0000 [IU]/h | INTRAMUSCULAR | Status: DC
  Administered 2014-09-16 – 2014-09-17 (×2): 1700 [IU]/h via INTRAVENOUS
  Filled 2014-09-16 (×3): qty 250

## 2014-09-16 MED ORDER — FAT EMULSION 20 % IV EMUL
250.0000 mL | INTRAVENOUS | Status: AC
  Administered 2014-09-16: 250 mL via INTRAVENOUS
  Filled 2014-09-16: qty 250

## 2014-09-16 MED ORDER — HEPARIN (PORCINE) IN NACL 100-0.45 UNIT/ML-% IJ SOLN
1400.0000 [IU]/h | INTRAMUSCULAR | Status: DC
  Administered 2014-09-16: 1400 [IU]/h via INTRAVENOUS
  Filled 2014-09-16 (×3): qty 250

## 2014-09-16 MED ORDER — IOHEXOL 300 MG/ML  SOLN
100.0000 mL | Freq: Once | INTRAMUSCULAR | Status: AC | PRN
  Administered 2014-09-16: 100 mL via INTRAVENOUS

## 2014-09-16 MED ORDER — TRACE MINERALS CR-CU-F-FE-I-MN-MO-SE-ZN IV SOLN
INTRAVENOUS | Status: AC
  Administered 2014-09-16: 18:00:00 via INTRAVENOUS
  Filled 2014-09-16: qty 3000

## 2014-09-16 MED ORDER — PIPERACILLIN-TAZOBACTAM 3.375 G IVPB
3.3750 g | Freq: Three times a day (TID) | INTRAVENOUS | Status: DC
  Administered 2014-09-16 – 2014-09-30 (×43): 3.375 g via INTRAVENOUS
  Filled 2014-09-16 (×44): qty 50

## 2014-09-16 NOTE — Progress Notes (Signed)
ANTICOAGULATION CONSULT NOTE - Initial Consult  Pharmacy Consult for Heparin Indication: atrial fibrillation  No Known Allergies  Patient Measurements: Height: 6' (182.9 cm) Weight: 221 lb 12.5 oz (100.6 kg) IBW/kg (Calculated) : 77.6 Heparin Dosing Weight: 98.1kg  Vital Signs: Temp: 98.1 F (36.7 C) (11/09 0800) Temp Source: Oral (11/09 0800) BP: 142/97 mmHg (11/09 0900) Pulse Rate: 99 (11/09 0900)  Labs:  Recent Labs  09/14/14 1100 09/15/14 0919 09/16/14 0403  HGB 9.5* 9.5* 9.7*  HCT 28.0* 27.9* 28.2*  PLT 365 387 429*  CREATININE 0.54  --  0.59    Estimated Creatinine Clearance: 102.5 mL/min (by C-G formula based on Cr of 0.59).   Medical History: Past Medical History  Diagnosis Date  . 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 Diabetes mellitus   . CAD (coronary artery disease)   . Personal history of other malignant neoplasm of skin   . Atrial fibrillation   . 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 Pt has been off anticoagulation for many years d/t ulcerative colitis. Pharmacy consulted to dose heparin gtt.  PTT: 15.9 (11/6) todays pending PT/INR: 1.26 (11/6) todays pending CBC: Hgb 9.7 (low but stable), Pltc 429 Previously on Heparin 5000 units subq Q8H (last dose 11/9 1018)  Goal of Therapy:  Heparin level 0.3-0.7 units/ml Monitor platelets by anticoagulation protocol: Yes   Plan:  No bolus Start Heparin infusion at 1400 units/hr Check Heparin level 8 hours after start of infusion Daily CBC  Loma BostonLaura Decorian Schuenemann, PharmD Pager: [phone] 09/16/2014 10:31 AM

## 2014-09-16 NOTE — Progress Notes (Signed)
ANTICOAGULATION CONSULT NOTE - Initial Consult  Pharmacy Consult for Heparin Indication: atrial fibrillation  Patient Measurements: Height: 6' (182.9 cm) Weight: 221 lb 12.5 oz (100.6 kg) IBW/kg (Calculated) : 77.6 Heparin Dosing Weight: 98.1kg   Assessment: 73yoM s/p total colectomy, permanent ileostomy on 11/3 with history of afib, rate controlled. Pt has been off anticoagulation for many years d/t ulcerative colitis. Pharmacy consulted to dose heparin gtt on 11/9.   First heparin level 0.11, subtherapeutic  Rn confirms infusion rate of 14 ml/hr, no interruptions.  No bleeding or complications reported.  Goal of Therapy:  Heparin level 0.3-0.7 units/ml Monitor platelets by anticoagulation protocol: Yes   Plan:   Heparin 2000 units bolus IV x 1  Increase to heparin IV infusion at 1700 units/hr  Heparin level 8 hours after rate change  Daily heparin level and CBC  Lynann Beaverhristine Arrabella Westerman PharmD, BCPS Pager [phone] 09/16/2014 8:15 PM

## 2014-09-16 NOTE — Progress Notes (Signed)
Pt is reluctant to get out of bed to chair because of anal leakage. He is find with the bed in the chair position so if he leaks it more contained.

## 2014-09-16 NOTE — Progress Notes (Signed)
SUBJECTIVE:  "Feels bad." No specific cardiac complaints.  No palpitations.   OBJECTIVE:   Vitals:   Filed Vitals:   09/16/14 0400 09/16/14 0446 09/16/14 0500 09/16/14 0600  BP:   117/56 137/53  Pulse: 99  102 94  Temp:      TempSrc:      Resp: 20 20 16 22   Height:      Weight:      SpO2: 96% 95% 94% 97%   I&O's:   Intake/Output Summary (Last 24 hours) at 09/16/14 0803 Last data filed at 09/16/14 0600  Gross per 24 hour  Intake   2675 ml  Output   1767 ml  Net    908 ml   TELEMETRY: Reviewed telemetry pt in : AFib, borderline rate control     PHYSICAL EXAM General: Well developed, well nourished, in no acute distress Head:   Normal cephalic and atramatic  Lungs:  No wheezing Heart:  irregular S1 S2  No JVD.   Abdomen: nondistended Msk:  Weak generally Extremities:   No edema.   Neuro: Hard of hearing Psych:  Normal affect, responds appropriately Skin: No rash   LABS: Basic Metabolic Panel:  Recent Labs  09/03/24 1100 09/16/14 0403  NA 134* 135*  K 4.2 4.5  CL 94* 95*  CO2 32 32  GLUCOSE 170* 146*  BUN 15 16  CREATININE 0.54 0.59  CALCIUM 8.2* 8.1*  MG  --  2.0  PHOS  --  4.4   Liver Function Tests:  Recent Labs  09/16/14 0403  AST 14  ALT 16  ALKPHOS 81  BILITOT 1.1  PROT 5.0*  ALBUMIN 1.1*   No results for input(s): LIPASE, AMYLASE in the last 72 hours. CBC:  Recent Labs  09/15/14 0919 09/16/14 0403  WBC 20.3* 23.3*  NEUTROABS  --  18.8*  HGB 9.5* 9.7*  HCT 27.9* 28.2*  MCV 90.6 90.7  PLT 387 429*   Cardiac Enzymes: No results for input(s): CKTOTAL, CKMB, CKMBINDEX, TROPONINI in the last 72 hours. BNP: Invalid input(s): POCBNP D-Dimer: No results for input(s): DDIMER in the last 72 hours. Hemoglobin A1C: No results for input(s): HGBA1C in the last 72 hours. Fasting Lipid Panel:  Recent Labs  09/16/14 0403  TRIG 144   Thyroid Function Tests: No results for input(s): TSH, T4TOTAL, T3FREE, THYROIDAB in the last 72  hours.  Invalid input(s): FREET3 Anemia Panel: No results for input(s): VITAMINB12, FOLATE, FERRITIN, TIBC, IRON, RETICCTPCT in the last 72 hours. Coag Panel:   Lab Results  Component Value Date   INR 1.26 09/13/2014   INR 1.08 Mar 13, 202015    RADIOLOGY: Dg Chest 1 View  09/12/2014   CLINICAL DATA:  PICC placement.  EXAM: CHEST - 1 VIEW 9:37 p.m.  COMPARISON:  09/12/2014 at 6:57 p.m.  FINDINGS: PICC tip is now  17 mm below the carina in good position.  Heart size and pulmonary vascularity are normal. Minimal atelectasis at the left lung base. Lungs are otherwise clear.  No acute osseous abnormality. Old deformity of the proximal right humerus.  IMPRESSION: PICC in good position.  Minimal atelectasis at the left lung base.   Electronically Signed   By: Geanie Cooley M.D.   On: 09/12/2014 22:01   Ct Abdomen Pelvis W Contrast  09/04/2014   CLINICAL DATA:  Ulcerative colitis with intestinal obstruction K51.912 (ICD-10-CM). 73 year old male with past medical history of diabetes, coronary artery disease, history of atrial fibrillation, history of PE, history of syncope and  ulcerative colitis present with exacerbation of ulcerative colitis and found to be in a-fibeval for abd pain and colitis  EXAM: CT ABDOMEN AND PELVIS WITH CONTRAST  TECHNIQUE: Multidetector CT imaging of the abdomen and pelvis was performed using the standard protocol following bolus administration of intravenous contrast.  CONTRAST:  50mL OMNIPAQUE IOHEXOL 300 MG/ML SOLN, 100mL OMNIPAQUE IOHEXOL 300 MG/ML SOLN  COMPARISON:  Plain films 01/13/2009.  Most recent CT of 03/14/2007  FINDINGS: Lower chest: Clear lung bases. Mild cardiomegaly. Right coronary artery atherosclerosis. No pericardial or pleural effusion.  Hepatobiliary: Minimal exclusion of the hepatic dome. Mild hepatic steatosis suspected. No focal liver lesion. Focal steatosis adjacent the falciform ligament. Normal gallbladder, without biliary ductal dilatation.  Pancreas: Mild  pancreatic atrophy. A tiny cystic focus in the pancreatic body on image 22 of series 2. Alternatively, this could represent interdigitation of peripancreatic fat. Appearance was likely present on the prior exam, suggesting a benign etiology.  Spleen: Normal  Adrenals/Urinary Tract: Normal adrenal glands. Bilateral renal cysts and too small to characterize lesions. No hydronephrosis. Normal ureters and urinary bladder.  Stomach/Bowel: Normal stomach, without wall thickening. Mild to moderate wall thickening involving the sigmoid and rectum. The descending colon is underdistended, without definite inflammation. There is also wall thickening involving the transverse colon. Hyperemia as evidenced by prominence of the Vasa recta. Ascending colon, terminal ileum, and appendix all normal. Normal small bowel without abdominal ascites.  Vascular/Lymphatic: Moderate aortic and branch vessel atherosclerosis. No retroperitoneal or retrocrural adenopathy. No pelvic adenopathy.  Reproductive:  Mild prostatomegaly.  Other: No significant free fluid. Fat containing left inguinal hernia.  Musculoskeletal: No acute osseous abnormality. 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  IMPRESSION: 1. Left-sided colitis, likely representing active ulcerative colitis. Infectious could look similar. Ischemia felt unlikely, given distribution and clinical history. 2. Atherosclerosis, including within the coronary arteries. 3. Hepatic steatosis.   Electronically Signed   By: Jeronimo GreavesKyle  Talbot M.D.   On: 09/04/2014 17:06   Dg Chest Port 1 View  09/12/2014   CLINICAL DATA:  Confirm PICC line placement.  EXAM: PORTABLE CHEST - 1 VIEW  COMPARISON:  09/06/2014  FINDINGS: There is a right arm PICC line. The catheter tip appears to be in the right atrium. The catheter could be pulled back 6 cm for positioning in the lower SVC. Low lung volumes. No focal airspace disease. Heart size is stable.  IMPRESSION: PICC line  tip in the right atrium.   Electronically Signed   By: Richarda OverlieAdam  Henn M.D.   On: 09/12/2014 19:15   Dg Chest Port 1 View  09/06/2014   CLINICAL DATA:  73 year old male status post PICC line placement. Initial encounter.  EXAM: PORTABLE CHEST - 1 VIEW  COMPARISON:  CT Abdomen and Pelvis 09/04/2014.  FINDINGS: Portable AP semi upright view at 1553 hrs. Right side PICC line placed, tip projects just below the carina. Normal cardiac size and mediastinal contours. Visualized tracheal air column is within normal limits. No pneumothorax. Somewhat low lung volumes. Mild increased interstitial markings, stable. Otherwise Allowing for portable technique, the lungs are clear.  IMPRESSION: 1. Right PICC line placed, tip at the SVC level. 2.  No acute cardiopulmonary abnormality.   Electronically Signed   By: Augusto GambleLee  Hall M.D.   On: 09/06/2014 16:21      ASSESSMENT: Ronald Mayo/PLAN:    1) AFib: rate controlled.  Hbg stable.  Would benefit from anticoagulation in order to prevent stroke.  In reviewing the initial consult note from  Dr. Clifton JamesMcAlhany, it was noted that the patient has been off of anticoagulation for many years due to his ulcerative colitis. If surgery feels that anticoagulation is safe from a bleeding standpoint, could initiate warfarin per pharmacy consult. Would also have to take into account his long-term bleeding risk from the ulcerative colitis as well.  Switch to oral metoprolol when tolerating pills.  Corky CraftsVARANASI,Ronald Eckford S., MD  09/16/2014  8:03 AM

## 2014-09-16 NOTE — Progress Notes (Signed)
Physical Therapy Treatment Patient Details Name: Ronald QuarryWilliam M Mayo MRN: [id number] DOB: 1940/12/17 Today's Date: 09/16/2014    History of Present Illness 73 y.o. male with h/o ulcerative colitis, DM, CAD, a fib, chronic diarrhea, chronic LLE wound admitted with bloody stools, abdominal pain and significant weight loss. Dx of colon stricture. 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 Re-eval today-09/11/14    PT Comments    Patient willing to get to recliner, once up, c/o back and abdominal pain, noted perspiring. RN to assess.   Follow Up Recommendations  SNF     Equipment Recommendations  None recommended by PT    Recommendations for Other Services       Precautions / Restrictions Precautions Precautions: Fall Precaution Comments: chronic wound L heel, pt wears compression stocking. Multiple lines/leads, drain.     Mobility  Bed Mobility Overal bed mobility: Needs Assistance Bed Mobility: Supine to Sit     Supine to sit: HOB elevated;Mod assist     General bed mobility comments: HOB ~60 degrees. Increased time. Multimodal cues for safety, technique. Pt is very HOH. Assist for trunk and bil LEs today. patient pulled up with theraoist hand and use of rail  Transfers Overall transfer level: Needs assistance   Transfers: Sit to/from BJ'sStand;Stand Pivot Transfers Sit to Stand: Mod assist         General transfer comment: Assist to rise, stabilize, control descent.Multimodal cues for safety, technique, hand placement,pt  able  too reach  for chair armrest and stabilize, then take shuffle steps to pivot to recliner.   Ambulation/Gait                 Stairs            Wheelchair Mobility    Modified Rankin (Stroke Patients Only)       Balance     Sitting balance-Leahy Scale: Fair     Standing balance support: During functional activity;Bilateral upper extremity supported Standing balance-Leahy Scale: Poor Standing balance comment: needs UE  support for stability                    Cognition Arousal/Alertness: Awake/alert Behavior During Therapy: Flat affect Overall Cognitive Status: Within Functional Limits for tasks assessed                      Exercises      General Comments        Pertinent Vitals/Pain Pain Score: 8  Pain Location: back and abdomen. catheter insertion, loosened Stat lock, informed RN Pain Descriptors / Indicators: Discomfort;Grimacing;Cramping;Spasm Pain Intervention(s): Limited activity within patient's tolerance;Monitored during session;Repositioned;PCA encouraged    Home Living                      Prior Function            PT Goals (current goals can now be found in the care plan section) Progress towards PT goals: Progressing toward goals    Frequency  Min 3X/week    PT Plan Current plan remains appropriate    Co-evaluation             End of Session Equipment Utilized During Treatment: Oxygen Activity Tolerance: Patient limited by fatigue;Patient limited by pain Patient left: in chair;with call bell/phone within reach     Time: [id number] PT Time Calculation (min): 35 min  Charges:  $Therapeutic Activity: 23-37 mins  G Codes:      Rada HayHill, Annarose Ouellet Elizabeth 09/16/2014, 9:39 AM Blanchard KelchKaren Beonka Amesquita PT [phone]

## 2014-09-16 NOTE — Progress Notes (Signed)
6 Days Post-Op TAC Subjective: Seems to be feeling better, having no nausea, up in a chair  Objective: Vital signs in last 24 hours: Temp:  [97.8 F (36.6 C)-98.4 F (36.9 C)] 98.1 F (36.7 C) (11/09 0800) Pulse Rate:  [91-110] 99 (11/09 0900) Resp:  [12-26] 16 (11/09 0900) BP: (106-158)/(46-97) 142/97 mmHg (11/09 0900) SpO2:  [92 %-100 %] 95 % (11/09 0900)   Intake/Output from previous day: 11/08 0701 - 11/09 0700 In: 2795 [I.V.:265; TPN:2530] Out: [id number] [Urine:1655; Drains:73; Stool:39] Intake/Output this shift:     General appearance: alert and cooperative GI: soft, distended  Incision: no significant drainage  Lab Results:   Recent Labs  09/15/14 0919 09/16/14 0403  WBC 20.3* 23.3*  HGB 9.5* 9.7*  HCT 27.9* 28.2*  PLT 387 429*   BMET  Recent Labs  09/14/14 1100 09/16/14 0403  NA 134* 135*  K 4.2 4.5  CL 94* 95*  CO2 32 32  GLUCOSE 170* 146*  BUN 15 16  CREATININE 0.54 0.59  CALCIUM 8.2* 8.1*   PT/INR No results for input(s): LABPROT, INR in the last 72 hours. ABG No results for input(s): PHART, HCO3 in the last 72 hours.  Invalid input(s): PCO2, PO2  MEDS, Scheduled . acetaminophen  1,000 mg Oral TID  . antiseptic oral rinse  7 mL Mouth Rinse BID  . [START ON 09/18/2014] becaplermin   Topical Q7 days  . citalopram  20 mg Oral Daily  . heparin subcutaneous  5,000 Units Subcutaneous 3 times per day  . HYDROmorphone PCA 0.3 mg/mL   Intravenous 6 times per day  . insulin aspart  0-15 Units Subcutaneous 6 times per day  . insulin glargine  5 Units Subcutaneous BID  . lip balm  1 application Topical BID  . metoprolol  2.5 mg Intravenous 4 times per day  . pantoprazole (PROTONIX) IV  40 mg Intravenous Q24H  . sodium chloride  10-40 mL Intracatheter Q12H    Studies/Results: No results found.  Assessment: s/p Procedure(s): TOTAL COLECTOMY PERMANENT ILEOSTOMY Patient Active Problem List   Diagnosis Date Noted  . Ulcerative colitis with  intestinal obstruction   . 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 Protein-calorie malnutrition, severe 09/03/2014  . Ulcerative colitis 09/02/2014  . Weakness 09/02/2014  . Diabetes 09/02/2014  . GERD (gastroesophageal reflux disease) 02/04/2014  . Depression 08/07/2013  . SYNCOPE AND COLLAPSE 05/07/2010  . SHORTNESS OF BREATH 05/07/2010  . Coronary atherosclerosis 05/14/2009  . Chronic atrial fibrillation 05/14/2009  . SKIN CANCER, HX OF 05/14/2009    Post op course at high risk of complications due to chronic medical problems and steroid use.  Plan: Hgb stable, start hep gtt  WBC rising.  Will get CT to eval for abscess Cont to ambulate frequently NPO until ileus resolves   LOS: 14 days     .Vanita PandaAlicia C Deisha Stull, MD [facility], [place] [phone]   09/16/2014 10:18 AM

## 2014-09-16 NOTE — Progress Notes (Signed)
CT scan show:  4.2 x 4.4 x 4.8 cm focal collection of debris and gas in the right central mesentery, adjacent to the superior mesenteric vein. This particular location has a different appearance than any of the other fluid seen in the abdomen or pelvis and given the history of leukocytosis, raises concern for evolving abscess, although it does not have a well-defined or enhancing rim at this time. Dr. Archer AsaMcCullough from IR reviewed and recommends antibiotics for now.  I am starting Zosyn.

## 2014-09-16 NOTE — Progress Notes (Signed)
PARENTERAL NUTRITION CONSULT NOTE   Pharmacy Consult for TPN Indication: Severe ulcerative colitis and malnutrition  No Known Allergies  Patient Measurements: Height: 6' (182.9 cm) Weight: 221 lb 12.5 oz (100.6 kg) IBW/kg (Calculated) : 77.6 Adjusted Body Weight:  Usual Weight: ~ 246lbs (30 lb wt loss over 3 months)  Vital Signs: Temp: 98.1 F (36.7 C) (11/09 0800) Temp Source: Oral (11/09 0800) BP: 137/53 mmHg (11/09 0600) Pulse Rate: 94 (11/09 0600) Intake/Output from previous day: 11/08 0701 - 11/09 0700 In: 2795 [I.V.:265; TPN:2530] Out: 1767 [Urine:1655; Drains:73; Stool:39] Intake/Output from this shift:   Labs:  Recent Labs  09/14/14 1100 09/15/14 0919 09/16/14 0403  WBC 20.8* 20.3* 23.3*  HGB 9.5* 9.5* 9.7*  HCT 28.0* 27.9* 28.2*  PLT 365 387 429*    Recent Labs  09/14/14 1100 09/16/14 0403  NA 134* 135*  K 4.2 4.5  CL 94* 95*  CO2 32 32  GLUCOSE 170* 146*  BUN 15 16  CREATININE 0.54 0.59  CALCIUM 8.2* 8.1*  MG  --  2.0  PHOS  --  4.4  PROT  --  5.0*  ALBUMIN  --  1.1*  AST  --  14  ALT  --  16  ALKPHOS  --  81  BILITOT  --  1.1  TRIG  --  144   Estimated Creatinine Clearance: 102.5 mL/min (by C-G formula based on Cr of 0.59).    Recent Labs  09/15/14 1634 09/15/14 2011 09/16/14 0438  GLUCAP 163* 156* 163*   Medical History: Past Medical History  Diagnosis Date  . 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 Diabetes mellitus   . CAD (coronary artery disease)   . Personal history of other malignant neoplasm of skin   . Atrial fibrillation   . 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  Colonoscopy reveals severe UC and proctitis with friable strictures.  Biopsies consistent with UC and not malignancy. General surgery consulted and plan is for proctocolectomy and J-pouch.  Pharmacy asked to start TPN to improve nutritional status for surgery. History of diabetes on glimepiride prior to admission, glimepiride held, using Novolog correction scale  Significant events:  10/30: TNA started at 40 ml/hr 11/1: advance TNA to 60 m/hr, removed insulin from TNA and added Lantus BID 11/3: total colectomy with ileostomy 11/5: TNA at goal of 17000ml/hr  Today (11/6):   Glucose - CBGs 140-160s.   Electrolytes - Na 135, Phos & Mag WNL, Corr Ca = 10.42 (ULN)  Renal - SCr WNL, I/O = + 1L  LFTs - WNL  TGs -  96 (11/2), 144 today  Prealbumin - 12.7 (10/30), 10.3 (11/2), pending today  NUTRITIONAL GOALS                                                                                             RD recs: 2200-2400 kcals, 115-130gm protein Clinimix 5/15 at a goal rate of 1100ml/hr + 20% fat emulsion at 6510ml/hr to provide: 120g/day protein, 2184Kcal/day.  PLAN                                                                                                                         At 1800 today:  Continue Clinimix E 5/15 at goal 16100ml/hr.  Continue lantus 5 units BID  Increase to 75 units regular insulin to 3L TPN bag (25 units/L) - to deliver 60 units insulin/24h  20% fat emulsion at 7410ml/hr.  TNA to contain standard multivitamins and trace elements.  [facility] to [facility]PN = 17425ml/hr per [facility])  Continue moderate SSI/CBGs to q4h  F/u pending prealbumin level  TNA lab panels on Mondays & Thursdays.  F/u daily.  Ronald Mayo, PharmD Pager: [phone] 09/16/2014 9:06 AM

## 2014-09-17 LAB — BASIC METABOLIC PANEL
Anion gap: 9 (ref 5–15)
BUN: 16 mg/dL (ref 6–23)
CO2: 31 mEq/L (ref 19–32)
Calcium: 8.3 mg/dL — ABNORMAL LOW (ref 8.4–10.5)
Chloride: 95 mEq/L — ABNORMAL LOW (ref 96–112)
Creatinine, Ser: 0.6 mg/dL (ref 0.50–1.35)
GFR calc Af Amer: >90 mL/min (ref >90)
GFR calc non Af Amer: >90 mL/min (ref >90)
GLUCOSE: 169 mg/dL — AB (ref 70–99)
Potassium: 4.4 mEq/L (ref 3.7–5.3)
SODIUM: 135 meq/L — AB (ref 137–147)

## 2014-09-17 LAB — CBC
HCT: 27.4 % — ABNORMAL LOW (ref 39.0–52.0)
HEMOGLOBIN: 9.2 g/dL — AB (ref 13.0–17.0)
MCH: 30.5 pg (ref 26.0–34.0)
MCHC: 33.6 g/dL (ref 30.0–36.0)
MCV: 90.7 fL (ref 78.0–100.0)
PLATELETS: 473 10*3/uL — AB (ref 150–400)
RBC: 3.02 MIL/uL — ABNORMAL LOW (ref 4.22–5.81)
RDW: 13.9 % (ref 11.5–15.5)
WBC: 18.6 10*3/uL — ABNORMAL HIGH (ref 4.0–10.5)

## 2014-09-17 LAB — GLUCOSE, CAPILLARY
GLUCOSE-CAPILLARY: 119 mg/dL — AB (ref 70–99)
GLUCOSE-CAPILLARY: 119 mg/dL — AB (ref 70–99)
Glucose-Capillary: 146 mg/dL — ABNORMAL HIGH (ref 70–99)
Glucose-Capillary: 165 mg/dL — ABNORMAL HIGH (ref 70–99)
Glucose-Capillary: 188 mg/dL — ABNORMAL HIGH (ref 70–99)
Glucose-Capillary: 162 mg/dL — ABNORMAL HIGH (ref 70–99)
Glucose-Capillary: 98 mg/dL (ref 70–99)

## 2014-09-17 LAB — TYPE AND SCREEN
ABO/RH(D): A NEG
ANTIBODY SCREEN: NEGATIVE
Unit division: 0
Unit division: 0

## 2014-09-17 LAB — HEPARIN LEVEL (UNFRACTIONATED)
Heparin Unfractionated: 0.26 IU/mL — ABNORMAL LOW (ref 0.30–0.70)
Heparin Unfractionated: 0.32 IU/mL (ref 0.30–0.70)

## 2014-09-17 MED ORDER — TRACE MINERALS CR-CU-F-FE-I-MN-MO-SE-ZN IV SOLN
INTRAVENOUS | Status: AC
  Administered 2014-09-17: 18:00:00 via INTRAVENOUS
  Filled 2014-09-17: qty 3000

## 2014-09-17 MED ORDER — HEPARIN (PORCINE) IN NACL 100-0.45 UNIT/ML-% IJ SOLN
1900.0000 [IU]/h | INTRAMUSCULAR | Status: DC
  Administered 2014-09-17 – 2014-09-18 (×3): 1900 [IU]/h via INTRAVENOUS
  Filled 2014-09-17 (×5): qty 250

## 2014-09-17 MED ORDER — FAT EMULSION 20 % IV EMUL
250.0000 mL | INTRAVENOUS | Status: AC
  Administered 2014-09-17: 250 mL via INTRAVENOUS
  Filled 2014-09-17: qty 250

## 2014-09-17 NOTE — Progress Notes (Signed)
Patient transferring to room 1528.  Report called to Annabelle Harmanana, Charity fundraiserN.  Patient to travel by bed.  Will continue to monitor.

## 2014-09-17 NOTE — Progress Notes (Signed)
CSW continuing to follow for disposition planning. Recommendation for SNF.  CSW reviewed chart and noted that pt now has NG tube in place. SNF facilities cannot manage NG tube. CSW will continue to follow as pt progresses and will re-initiate SNF search as pt progresses.  CSW contacted pt daughter, Ronald Mayo and left voice message.  Pt transferred to room 1528, CSW to provide hand off to [facility] CSW, Cori RazorJamie Haidinger to continue to follow to provide support and assist with pt disposition planning.  Ronald Mayo, MSW, LCSW Clinical Social Work [phone]

## 2014-09-17 NOTE — Progress Notes (Signed)
PARENTERAL NUTRITION CONSULT NOTE   Pharmacy Consult for TPN Indication: Severe ulcerative colitis and malnutrition  No Known Allergies  Patient Measurements: Height: 6' (182.9 cm) Weight: 229 lb 8 oz (104.1 kg) IBW/kg (Calculated) : 77.6 Adjusted Body Weight:  Usual Weight: ~ 246lbs (30 lb wt loss over 3 months)  Vital Signs: Temp: 98.2 F (36.8 C) (11/10 0800) Temp Source: Oral (11/10 0800) BP: 158/75 mmHg (11/10 0900) Pulse Rate: 85 (11/10 0900) Intake/Output from previous day: 11/09 0701 - 11/10 0700 In: 3311.5 [I.V.:521.5; IV Piggyback:150; TPN:2640] Out: 2585 [Urine:2515; Drains:50; Stool:20] Intake/Output from this shift: Total I/O In: 258 [I.V.:38; TPN:220] Out: 175 [Urine:175] Labs:  Recent Labs  09/15/14 0919 09/16/14 0403 09/16/14 1145 09/17/14 0540  WBC 20.3* 23.3*  --  18.6*  HGB 9.5* 9.7*  --  9.2*  HCT 27.9* 28.2*  --  27.4*  PLT 387 429*  --  473*  APTT  --   --  38*  --   INR  --   --  1.11  --     Recent Labs  09/14/14 1100 09/16/14 0403 09/17/14 0540  NA 134* 135* 135*  K 4.2 4.5 4.4  CL 94* 95* 95*  CO2 32 32 31  GLUCOSE 170* 146* 169*  BUN 15 16 16   CREATININE 0.54 0.59 0.60  CALCIUM 8.2* 8.1* 8.3*  MG  --  2.0  --   PHOS  --  4.4  --   PROT  --  5.0*  --   ALBUMIN  --  1.1*  --   AST  --  14  --   ALT  --  16  --   ALKPHOS  --  81  --   BILITOT  --  1.1  --   PREALBUMIN  --  5.0*  --   TRIG  --  144  --    Estimated Creatinine Clearance: 104.1 mL/min (by C-G formula based on Cr of 0.6).    Recent Labs  09/17/14 0021 09/17/14 0419 09/17/14 0904  GLUCAP 146* 165* 188*   Medical History: Past Medical History  Diagnosis Date  . 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 Diabetes mellitus   . CAD (coronary artery disease)   . Personal history of other malignant neoplasm of skin   . Atrial fibrillation   . 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  Colonoscopy reveals severe UC and proctitis with friable strictures.  Biopsies consistent with UC and not malignancy. General surgery consulted and plan is for proctocolectomy and J-pouch.  Pharmacy asked to start TPN to  improve nutritional status for surgery. History of diabetes on glimepiride prior to admission, glimepiride held, using Novolog correction scale  Significant events:  10/30: TNA started at 40 ml/hr 11/1: advance TNA to 60 m/hr, removed insulin from TNA and added Lantus BID 11/3: total colectomy with ileostomy 11/5: TNA at goal of 14000ml/hr  Today:   Glucose - above goal CBGs 160-180s.  Electrolytes - Na 135, K WNL, Corr Ca = 10.62 (ULN)  Renal - SCr WNL, I/O = + 726mL  LFTs - WNL  TGs - 96 (11/2), 144 (11/10)  Prealbumin - 12.7 (10/30), 10.3 (11/2), 5 (11/9)  NUTRITIONAL GOALS                                                                                             RD recs: 2200-2400 kcals, 115-130gm protein Clinimix 5/15 at a goal rate of 19000ml/hr + 20% fat emulsion at 6510ml/hr to provide: 120g/day protein, 2184Kcal/day.  PLAN                                                                                                                         At 1800 today:  Continue Clinimix E 5/15 at goal 16500ml/hr.  Continue lantus 5 units BID  Increase insulin to 90 units regular insulin to 3L TPN bag (30 units/L) - to deliver 72 units insulin/24h  20% fat emulsion at 1910ml/hr.  TNA to contain standard multivitamins and trace elements.  [facility] ([facility]F to TPN = 15725ml/hr per [facility])  Continue moderate SSI/CBGs to q4h  TNA lab panels on  Mondays & Thursdays.  F/u daily.  Loma BostonLaura Tysheka Fanguy, PharmD Pager: [phone] 09/17/2014 9:18 AM

## 2014-09-17 NOTE — Progress Notes (Signed)
SUBJECTIVE:  "Feels bad." No specific cardiac complaints.  No palpitations.  Rate better.  ?evolving abscess.  NG tube to be placed.   OBJECTIVE:   Vitals:   Filed Vitals:   09/17/14 0300 09/17/14 0400 09/17/14 0500 09/17/14 0600  BP: 125/62 151/63 120/67 146/58  Pulse: 95 85 94 94  Temp:  97.8 F (36.6 C)    TempSrc:  Oral    Resp: 19 18 15 18   Height:      Weight:  229 lb 8 oz (104.1 kg)    SpO2: 96% 97% 95% 95%   I&O's:    Intake/Output Summary (Last 24 hours) at 09/17/14 0759 Last data filed at 09/17/14 8119  Gross per 24 hour  Intake 3182.45 ml  Output   2585 ml  Net 597.45 ml   TELEMETRY: Reviewed telemetry pt in : AFib, borderline rate control     PHYSICAL EXAM General: Well developed, well nourished, in no acute distress Head:   Normal cephalic and atramatic  Lungs:  No wheezing Heart:  irregular S1 S2  No JVD.   Abdomen: nondistended; ostomy present Msk:  Weak generally Extremities:   No edema.   Neuro: Hard of hearing Psych:  Normal affect, responds appropriately Skin: No rash   LABS: Basic Metabolic Panel:  Recent Labs  14/78/29 0403 09/17/14 0540  NA 135* 135*  K 4.5 4.4  CL 95* 95*  CO2 32 31  GLUCOSE 146* 169*  BUN 16 16  CREATININE 0.59 0.60  CALCIUM 8.1* 8.3*  MG 2.0  --   PHOS 4.4  --    Liver Function Tests:  Recent Labs  09/16/14 0403  AST 14  ALT 16  ALKPHOS 81  BILITOT 1.1  PROT 5.0*  ALBUMIN 1.1*   No results for input(s): LIPASE, AMYLASE in the last 72 hours. CBC:  Recent Labs  09/16/14 0403 09/17/14 0540  WBC 23.3* 18.6*  NEUTROABS 18.8*  --   HGB 9.7* 9.2*  HCT 28.2* 27.4*  MCV 90.7 90.7  PLT 429* 473*   Cardiac Enzymes: No results for input(s): CKTOTAL, CKMB, CKMBINDEX, TROPONINI in the last 72 hours. BNP: Invalid input(s): POCBNP D-Dimer: No results for input(s): DDIMER in the last 72 hours. Hemoglobin A1C: No results for input(s): HGBA1C in the last 72 hours. Fasting Lipid Panel:  Recent  Labs  09/16/14 0403  TRIG 144   Thyroid Function Tests: No results for input(s): TSH, T4TOTAL, T3FREE, THYROIDAB in the last 72 hours.  Invalid input(s): FREET3 Anemia Panel: No results for input(s): VITAMINB12, FOLATE, FERRITIN, TIBC, IRON, RETICCTPCT in the last 72 hours. Coag Panel:   Lab Results  Component Value Date   INR 1.11 09/16/2014   INR 1.26 09/13/2014   INR 1.08 06/04/2014    RADIOLOGY: Dg Chest 1 View  09/12/2014   CLINICAL DATA:  PICC placement.  EXAM: CHEST - 1 VIEW 9:37 p.m.  COMPARISON:  09/12/2014 at 6:57 p.m.  FINDINGS: PICC tip is now  17 mm below the carina in good position.  Heart size and pulmonary vascularity are normal. Minimal atelectasis at the left lung base. Lungs are otherwise clear.  No acute osseous abnormality. Old deformity of the proximal right humerus.  IMPRESSION: PICC in good position.  Minimal atelectasis at the left lung base.   Electronically Signed   By: Geanie Cooley M.D.   On: 09/12/2014 22:01   Ct Abdomen Pelvis W Contrast  09/16/2014   ADDENDUM REPORT: 09/16/2014 16:23  ADDENDUM: I discussed the  results of this study with Dr. Maisie Fus at approximately 1620 hours on 09/16/2014.   Electronically Signed   By: Kennith Center M.D.   On: 09/16/2014 16:23   09/16/2014   CLINICAL DATA:  Subsequent encounter for longstanding ulcerative colitis with recent diagnosis is sigmoid stricture. Patient is status post total colectomy with end ileostomy on 05/23/2014. Rising white cell count.  EXAM: CT ABDOMEN AND PELVIS WITH CONTRAST  TECHNIQUE: Multidetector CT imaging of the abdomen and pelvis was performed using the standard protocol following bolus administration of intravenous contrast.  CONTRAST:  OMNIPAQUE IOHEXOL 300 MG/ML  SOLN  COMPARISON:  09/04/2014  FINDINGS: Lower chest: Compressive atelectasis is noted in both lower lobes with small bilateral pleural effusions.  Hepatobiliary: No focal abnormality within the liver parenchyma. Gallbladder is  distended without evidence for stones. No intrahepatic or extrahepatic biliary dilation.  Pancreas: No focal mass lesion. No dilatation of the main duct. No intraparenchymal cyst. No peripancreatic edema.  Spleen: No splenomegaly. No focal mass lesion.  Adrenals/Urinary Tract: No adrenal nodule or mass. Bilateral renal cysts are again noted. No hydronephrosis or evidence of hydroureter. Foley catheter decompresses the urinary bladder. Gas in the bladder is compatible with the instrumentation.  Stomach/Bowel: Stomach is moderately distended. Duodenum is normal in appearance. No evidence for small bowel dilatation. No small bowel wall thickening. Gas is visible in the ileum and there is air in the ileal loop tracking out the stoma to the right abdominal and ileostomy. Patient is status post subtotal colectomy. Fluid filled Hartmann's pouch is evident.  Vascular/Lymphatic: Atherosclerotic calcification is noted in the wall of the abdominal aorta without aneurysm. No gastrohepatic or hepatoduodenal ligament lymphadenopathy. No retroperitoneal lymphadenopathy. No evidence for pelvic sidewall lymphadenopathy.  Reproductive: Prostate gland and seminal vesicles are unremarkable.  Other: 4.2 x 4.4 x 4.8 cm focal area of fluid and debris and gas is identified in the right mesentery, just lateral to the superior mesenteric vein. While the remaining areas of intraperitoneal free fluid in the abdomen appear to be more free flowing, this collection appears loculated although it does not have a well-defined or thick rim.  A left abdominal surgical drain tracks down into the pelvis were crosses the midline just caudal to the Hartmann's pouch with the and of the drain coursing along the right pelvic sidewall. There is small volume perihepatic ascites with a small amount of fluid in the right paracolic gutter. Diffuse areas of mesenteric edema are noted, compatible with recent surgery. There is a small amount of free fluid in the  mesenteric loops of the pelvis and in some areas this fluid demonstrates subtle peritoneal enhancement, suggesting associated peritoneal irritation.  The  A small amount of intraperitoneal free air is associated with air in the Right rectus sheath and in the subcutaneous of the right anterior abdominal wall.  Musculoskeletal: Bone windows reveal no worrisome lytic or sclerotic osseous lesions.  IMPRESSION: 4.2 x 4.4 x 4.8 cm focal collection of debris and gas in the right central mesentery, adjacent to the superior mesenteric vein. This particular location has a different appearance than any of the other fluid seen in the abdomen or pelvis and given the history of leukocytosis, raises concern for evolving abscess, although it does not have a well-defined or enhancing rim at this time.  Status post subtotal colectomy with right abdominal end ileostomy. No evidence for bowel obstruction. There is a small amount of ascites adjacent to the liver and in the right paracolic gutter and subtle  peritoneal enhancement is seen along some of the fluid collections although they do not appear to be well organized at this time.  A very small amount of intraperitoneal free air is associated with free air in the rectus sheath and anterior abdominal wall, not unexpected 6 days out from surgery.  Mild dependent atelectasis in the lower lobes bilaterally with small bilateral pleural effusions.  Electronically Signed: By: Kennith CenterEric  Mansell M.D. On: 09/16/2014 15:56   Ct Abdomen Pelvis W Contrast  09/04/2014   CLINICAL DATA:  Ulcerative colitis with intestinal obstruction K51.912 (ICD-10-CM). 73 year old male with past medical history of diabetes, coronary artery disease, history of atrial fibrillation, history of PE, history of syncope and ulcerative colitis present with exacerbation of ulcerative colitis and found to be in a-fibeval for abd pain and colitis  EXAM: CT ABDOMEN AND PELVIS WITH CONTRAST  TECHNIQUE: Multidetector CT imaging  of the abdomen and pelvis was performed using the standard protocol following bolus administration of intravenous contrast.  CONTRAST:  50mL OMNIPAQUE IOHEXOL 300 MG/ML SOLN, 100mL OMNIPAQUE IOHEXOL 300 MG/ML SOLN  COMPARISON:  Plain films 01/13/2009.  Most recent CT of 03/14/2007  FINDINGS: Lower chest: Clear lung bases. Mild cardiomegaly. Right coronary artery atherosclerosis. No pericardial or pleural effusion.  Hepatobiliary: Minimal exclusion of the hepatic dome. Mild hepatic steatosis suspected. No focal liver lesion. Focal steatosis adjacent the falciform ligament. Normal gallbladder, without biliary ductal dilatation.  Pancreas: Mild pancreatic atrophy. A tiny cystic focus in the pancreatic body on image 22 of series 2. Alternatively, this could represent interdigitation of peripancreatic fat. Appearance was likely present on the prior exam, suggesting a benign etiology.  Spleen: Normal  Adrenals/Urinary Tract: Normal adrenal glands. Bilateral renal cysts and too small to characterize lesions. No hydronephrosis. Normal ureters and urinary bladder.  Stomach/Bowel: Normal stomach, without wall thickening. Mild to moderate wall thickening involving the sigmoid and rectum. The descending colon is underdistended, without definite inflammation. There is also wall thickening involving the transverse colon. Hyperemia as evidenced by prominence of the Vasa recta. Ascending colon, terminal ileum, and appendix all normal. Normal small bowel without abdominal ascites.  Vascular/Lymphatic: Moderate aortic and branch vessel atherosclerosis. No retroperitoneal or retrocrural adenopathy. No pelvic adenopathy.  Reproductive:  Mild prostatomegaly.  Other: No significant free fluid. Fat containing left inguinal hernia.  Musculoskeletal: No acute osseous abnormality. 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  IMPRESSION: 1. Left-sided colitis, likely representing active ulcerative  colitis. Infectious could look similar. Ischemia felt unlikely, given distribution and clinical history. 2. Atherosclerosis, including within the coronary arteries. 3. Hepatic steatosis.   Electronically Signed   By: Jeronimo GreavesKyle  Talbot M.D.   On: 09/04/2014 17:06   Dg Chest Port 1 View  09/12/2014   CLINICAL DATA:  Confirm PICC line placement.  EXAM: PORTABLE CHEST - 1 VIEW  COMPARISON:  09/06/2014  FINDINGS: There is a right arm PICC line. The catheter tip appears to be in the right atrium. The catheter could be pulled back 6 cm for positioning in the lower SVC. Low lung volumes. No focal airspace disease. Heart size is stable.  IMPRESSION: PICC line tip in the right atrium.   Electronically Signed   By: Richarda OverlieAdam  Henn M.D.   On: 09/12/2014 19:15   Dg Chest Port 1 View  09/06/2014   CLINICAL DATA:  73 year old male status post PICC line placement. Initial encounter.  EXAM: PORTABLE CHEST - 1 VIEW  COMPARISON:  CT Abdomen and Pelvis 09/04/2014.  FINDINGS: Portable AP semi  upright view at 1553 hrs. Right side PICC line placed, tip projects just below the carina. Normal cardiac size and mediastinal contours. Visualized tracheal air column is within normal limits. No pneumothorax. Somewhat low lung volumes. Mild increased interstitial markings, stable. Otherwise Allowing for portable technique, the lungs are clear.  IMPRESSION: 1. Right PICC line placed, tip at the SVC level. 2.  No acute cardiopulmonary abnormality.   Electronically Signed   By: Augusto GambleLee  Hall M.D.   On: 09/06/2014 16:21      ASSESSMENT: Roosvelt Harps/PLAN:    1) AFib: rate controlled.  Hbg stable.  Would benefit from anticoagulation in order to prevent stroke.  In reviewing the initial consult note from Dr. Clifton JamesMcAlhany, it was noted that the patient has been off of anticoagulation for many years due to his ulcerative colitis. If surgery feels that anticoagulation is safe from a bleeding standpoint, could initiate warfarin per pharmacy consult. Would also have to take  into account his long-term bleeding risk from the ulcerative colitis as well.  Switch to oral metoprolol when tolerating pills.  D/w surgery. Use IV heparin for now.  Long term Bleeding risk is lower after the surgery but he still has UC disease in the rectum.  Will only think about Coumadin when surgical issues clear.  Corky CraftsVARANASI,JAYADEEP S., MD  09/17/2014  7:59 AM

## 2014-09-17 NOTE — Progress Notes (Signed)
ANTICOAGULATION CONSULT NOTE - F/u Consult  Pharmacy Consult for Heparin Indication: atrial fibrillation  Patient Measurements: Height: 6' (182.9 cm) Weight: 229 lb 8 oz (104.1 kg) IBW/kg (Calculated) : 77.6 Heparin Dosing Weight: 98.1kg   Assessment: 73yoM s/p total colectomy, permanent ileostomy on 11/3 with history of afib, rate controlled. Pt has been off anticoagulation for many years d/t ulcerative colitis. Pharmacy consulted to dose heparin gtt on 11/9.  11/9: First heparin level 0.11, subtherapeutic 11/10: HL = 0.26, slightly low, increase rate, recheck in 8hrs  Now: HL = 0.32, therapeutic on 1900units/hr. No problems per RN.  Goal of Therapy:  Heparin level 0.3-0.7 units/ml Monitor platelets by anticoagulation protocol: Yes   Plan:   Cont heparin IV infusion at 1900 units/hr.  Daily heparin level and CBC.  Ronald Mayo, PharmD, pager [phone]. 09/17/2014,3:38 PM.

## 2014-09-17 NOTE — Progress Notes (Signed)
7 Days Post-Op TAC Subjective: Seems to be feeling better, having some nausea with bilious emesis  Objective: Vital signs in last 24 hours: Temp:  [97.6 F (36.4 C)-98.7 F (37.1 C)] 97.8 F (36.6 C) (11/10 0400) Pulse Rate:  [77-106] 94 (11/10 0600) Resp:  [13-19] 18 (11/10 0600) BP: (109-161)/(51-97) 146/58 mmHg (11/10 0600) SpO2:  [92 %-99 %] 95 % (11/10 0600) Weight:  [229 lb 8 oz (104.1 kg)] 229 lb 8 oz (104.1 kg) (11/10 0400)   Intake/Output from previous day: 11/09 0701 - 11/10 0700 In: 3182.5 [I.V.:502.5; IV Piggyback:150; TPN:2530] Out: 2585 [Urine:2515; Drains:50; Stool:20] Intake/Output this shift:   General appearance: alert and cooperative GI: soft, distended Ost: pink, viable Incision: no significant drainage  Lab Results:   Recent Labs  09/16/14 0403 09/17/14 0540  WBC 23.3* 18.6*  HGB 9.7* 9.2*  HCT 28.2* 27.4*  PLT 429* 473*   BMET  Recent Labs  09/16/14 0403 09/17/14 0540  NA 135* 135*  K 4.5 4.4  CL 95* 95*  CO2 32 31  GLUCOSE 146* 169*  BUN 16 16  CREATININE 0.59 0.60  CALCIUM 8.1* 8.3*   PT/INR  Recent Labs  09/16/14 1145  LABPROT 14.4  INR 1.11   ABG No results for input(s): PHART, HCO3 in the last 72 hours.  Invalid input(s): PCO2, PO2  MEDS, Scheduled . acetaminophen  1,000 mg Oral TID  . antiseptic oral rinse  7 mL Mouth Rinse BID  . [START ON 09/18/2014] becaplermin   Topical Q7 days  . citalopram  20 mg Oral Daily  . HYDROmorphone PCA 0.3 mg/mL   Intravenous 6 times per day  . insulin aspart  0-15 Units Subcutaneous 6 times per day  . insulin glargine  5 Units Subcutaneous BID  . lip balm  1 application Topical BID  . metoprolol  2.5 mg Intravenous 4 times per day  . pantoprazole (PROTONIX) IV  40 mg Intravenous Q24H  . piperacillin-tazobactam (ZOSYN)  IV  3.375 g Intravenous 3 times per day  . sodium chloride  10-40 mL Intracatheter Q12H    Studies/Results: Ct Abdomen Pelvis W Contrast  09/16/2014    ADDENDUM REPORT: 09/16/2014 16:23  ADDENDUM: I discussed the results of this study with Dr. Maisie Fushomas at approximately 1620 hours on 09/16/2014.   Electronically Signed   By: Kennith CenterEric  Mansell M.D.   On: 09/16/2014 16:23   09/16/2014   CLINICAL DATA:  Subsequent encounter for longstanding ulcerative colitis with recent diagnosis is sigmoid stricture. 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 Rising white cell count.  EXAM: CT ABDOMEN AND PELVIS WITH CONTRAST  TECHNIQUE: Multidetector CT imaging of the abdomen and pelvis was performed using the standard protocol following bolus administration of intravenous contrast.  CONTRAST:  100mL OMNIPAQUE IOHEXOL 300 MG/ML  SOLN  COMPARISON:  09/04/2014  FINDINGS: Lower chest: Compressive atelectasis is noted in both lower lobes with small bilateral pleural effusions.  Hepatobiliary: No focal abnormality within the liver parenchyma. Gallbladder is distended without evidence for stones. No intrahepatic or extrahepatic biliary dilation.  Pancreas: No focal mass lesion. No dilatation of the main duct. No intraparenchymal cyst. No peripancreatic edema.  Spleen: No splenomegaly. No focal mass lesion.  Adrenals/Urinary Tract: No adrenal nodule or mass. Bilateral renal cysts are again noted. No hydronephrosis or evidence of hydroureter. Foley catheter decompresses the urinary bladder. Gas in the bladder is compatible with the instrumentation.  Stomach/Bowel: Stomach is moderately distended. Duodenum is normal in appearance. No  evidence for small bowel dilatation. No small bowel wall thickening. Gas is visible in the ileum and there is air in the ileal loop tracking out the stoma to the right abdominal and ileostomy. Patient is status post subtotal colectomy. Fluid filled Hartmann's pouch is evident.  Vascular/Lymphatic: Atherosclerotic calcification is noted in the wall of the abdominal aorta without aneurysm. No gastrohepatic or hepatoduodenal ligament  lymphadenopathy. No retroperitoneal lymphadenopathy. No evidence for pelvic sidewall lymphadenopathy.  Reproductive: Prostate gland and seminal vesicles are unremarkable.  Other: 4.2 x 4.4 x 4.8 cm focal area of fluid and debris and gas is identified in the right mesentery, just lateral to the superior mesenteric vein. While the remaining areas of intraperitoneal free fluid in the abdomen appear to be more free flowing, this collection appears loculated although it does not have a well-defined or thick rim.  A left abdominal surgical drain tracks down into the pelvis were crosses the midline just caudal to the Hartmann's pouch with the and of the drain coursing along the right pelvic sidewall. There is small volume perihepatic ascites with a small amount of fluid in the right paracolic gutter. Diffuse areas of mesenteric edema are noted, compatible with recent surgery. There is a small amount of free fluid in the mesenteric loops of the pelvis and in some areas this fluid demonstrates subtle peritoneal enhancement, suggesting associated peritoneal irritation.  The  A small amount of intraperitoneal free air is associated with air in the Right rectus sheath and in the subcutaneous of the right anterior abdominal wall.  Musculoskeletal: Bone windows reveal no worrisome lytic or sclerotic osseous lesions.  IMPRESSION: 4.2 x 4.4 x 4.8 cm focal collection of debris and gas in the right central mesentery, adjacent to the superior mesenteric vein. This particular location has a different appearance than any of the other fluid seen in the abdomen or pelvis and given the history of leukocytosis, raises concern for evolving abscess, although it does not have a well-defined or enhancing rim at this time.  Status post subtotal colectomy with right abdominal end ileostomy. No evidence for bowel obstruction. There is a small amount of ascites adjacent to the liver and in the right paracolic gutter and subtle peritoneal  enhancement is seen along some of the fluid collections although they do not appear to be well organized at this time.  A very small amount of intraperitoneal free air is associated with free air in the rectus sheath and anterior abdominal wall, not unexpected 6 days out from surgery.  Mild dependent atelectasis in the lower lobes bilaterally with small bilateral pleural effusions.  Electronically Signed: By: Kennith CenterEric  Mansell M.D. On: 09/16/2014 15:56    Assessment: s/p Procedure(s): TOTAL COLECTOMY PERMANENT ILEOSTOMY Patient Active Problem List   Diagnosis Date Noted  . Ulcerative colitis with intestinal obstruction   . 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 Protein-calorie malnutrition, severe 09/03/2014  . Ulcerative colitis 09/02/2014  . Weakness 09/02/2014  . Diabetes 09/02/2014  . GERD (gastroesophageal reflux disease) 02/04/2014  . Depression 08/07/2013  . SYNCOPE AND COLLAPSE 05/07/2010  . SHORTNESS OF BREATH 05/07/2010  . Coronary atherosclerosis 05/14/2009  . Chronic atrial fibrillation 05/14/2009  . SKIN CANCER, HX OF 05/14/2009    Post op course at high risk of complications due to chronic medical problems and steroid use.  Plan: Hgb stable, start hep gtt  WBC better.  CT shows mesenteric abscess, unable to access via IR, Zosyn started Cont to ambulate frequently NPO until ileus resolves, will  insert NG   LOS: 15 days     .Vanita Panda, MD [facility], [place] [phone]   09/17/2014 8:13 AM

## 2014-09-17 NOTE — Progress Notes (Signed)
ANTICOAGULATION CONSULT NOTE - F/u Consult  Pharmacy Consult for Heparin Indication: atrial fibrillation  Patient Measurements: Height: 6' (182.9 cm) Weight: 229 lb 8 oz (104.1 kg) IBW/kg (Calculated) : 77.6 Heparin Dosing Weight: 98.1kg   Assessment: 73yoM s/p total colectomy, permanent ileostomy on 11/3 with history of afib, rate controlled. Pt has been off anticoagulation for many years d/t ulcerative colitis. Pharmacy consulted to dose heparin gtt on 11/9.  11/9  First heparin level 0.11, subtherapeutic  11/10 (Today)  HL = 0.26  No problems per RN  Goal of Therapy:  Heparin level 0.3-0.7 units/ml Monitor platelets by anticoagulation protocol: Yes   Plan:   Increase to heparin IV infusion at 1900 units/hr  Heparin level 8 hours after rate change  Daily heparin level and CBC  Lorenza EvangelistGreen, Stephaney Steven R 09/17/2014 6:24 AM

## 2014-09-18 LAB — CBC
HCT: 25.2 % — ABNORMAL LOW (ref 39.0–52.0)
HEMATOCRIT: 25 % — AB (ref 39.0–52.0)
HEMOGLOBIN: 8.4 g/dL — AB (ref 13.0–17.0)
Hemoglobin: 8.2 g/dL — ABNORMAL LOW (ref 13.0–17.0)
MCH: 30.8 pg (ref 26.0–34.0)
MCH: 30.7 pg (ref 26.0–34.0)
MCHC: 33.3 g/dL (ref 30.0–36.0)
MCHC: 32.8 g/dL (ref 30.0–36.0)
MCV: 92.3 fL (ref 78.0–100.0)
MCV: 93.6 fL (ref 78.0–100.0)
Platelets: 479 10*3/uL — ABNORMAL HIGH (ref 150–400)
Platelets: 478 10*3/uL — ABNORMAL HIGH (ref 150–400)
RBC: 2.73 MIL/uL — ABNORMAL LOW (ref 4.22–5.81)
RBC: 2.67 MIL/uL — ABNORMAL LOW (ref 4.22–5.81)
RDW: 14.2 % (ref 11.5–15.5)
RDW: 14.3 % (ref 11.5–15.5)
WBC: 18.6 10*3/uL — ABNORMAL HIGH (ref 4.0–10.5)
WBC: 16.6 10*3/uL — AB (ref 4.0–10.5)

## 2014-09-18 LAB — GLUCOSE, CAPILLARY
Glucose-Capillary: 78 mg/dL (ref 70–99)
Glucose-Capillary: 90 mg/dL (ref 70–99)
Glucose-Capillary: 107 mg/dL — ABNORMAL HIGH (ref 70–99)
Glucose-Capillary: 86 mg/dL (ref 70–99)
Glucose-Capillary: 108 mg/dL — ABNORMAL HIGH (ref 70–99)
Glucose-Capillary: 122 mg/dL — ABNORMAL HIGH (ref 70–99)

## 2014-09-18 LAB — TYPE AND SCREEN
ABO/RH(D): A NEG
Antibody Screen: NEGATIVE

## 2014-09-18 LAB — HEPARIN LEVEL (UNFRACTIONATED): HEPARIN UNFRACTIONATED: 0.33 [IU]/mL (ref 0.30–0.70)

## 2014-09-18 MED ORDER — FAT EMULSION 20 % IV EMUL
250.0000 mL | INTRAVENOUS | Status: AC
  Administered 2014-09-18: 250 mL via INTRAVENOUS
  Filled 2014-09-18: qty 250

## 2014-09-18 MED ORDER — M.V.I. ADULT IV INJ
INTRAVENOUS | Status: AC
  Administered 2014-09-18: 18:00:00 via INTRAVENOUS
  Filled 2014-09-18: qty 3000

## 2014-09-18 NOTE — Progress Notes (Signed)
SUBJECTIVE:  "Feels up and down." No specific cardiac complaints.  No palpitations.  Rate better.  CT showed abscess.  NG tube  placed.   OBJECTIVE:   Vitals:   Filed Vitals:   09/18/14 0000 09/18/14 0400 09/18/14 0628 09/18/14 0633  BP:   131/66   Pulse:   98   Temp:   98 F (36.7 C)   TempSrc:   Oral   Resp: 16 16 16 16   Height:      Weight:      SpO2: 98% 100% 97% 98%   I&O's:    Intake/Output Summary (Last 24 hours) at 09/18/14 0749 Last data filed at 09/18/14 0630  Gross per 24 hour  Intake    593 ml  Output   2585 ml  Net  -1992 ml        PHYSICAL EXAM General: Well developed, well nourished, in no acute distress Head:   Normal cephalic and atramatic  Lungs:  No wheezing Heart:  irregular S1 S2  No JVD.  92 apical HR Abdomen: nondistended; ostomy present Msk:  Weak generally Extremities:   No edema.   Neuro: Hard of hearing Psych:  Normal affect, responds appropriately Skin: No rash   LABS: Basic Metabolic Panel:  Recent Labs  16/08/9610/09/15 0403 09/17/14 0540  NA 135* 135*  K 4.5 4.4  CL 95* 95*  CO2 32 31  GLUCOSE 146* 169*  BUN 16 16  CREATININE 0.59 0.60  CALCIUM 8.1* 8.3*  MG 2.0  --   PHOS 4.4  --    Liver Function Tests:  Recent Labs  09/16/14 0403  AST 14  ALT 16  ALKPHOS 81  BILITOT 1.1  PROT 5.0*  ALBUMIN 1.1*   No results for input(s): LIPASE, AMYLASE in the last 72 hours. CBC:  Recent Labs  09/16/14 0403 09/17/14 0540 09/18/14 0540  WBC 23.3* 18.6* 18.6*  NEUTROABS 18.8*  --   --   HGB 9.7* 9.2* 8.4*  HCT 28.2* 27.4* 25.2*  MCV 90.7 90.7 92.3  PLT 429* 473* 479*   Cardiac Enzymes: No results for input(s): CKTOTAL, CKMB, CKMBINDEX, TROPONINI in the last 72 hours. BNP: Invalid input(s): POCBNP D-Dimer: No results for input(s): DDIMER in the last 72 hours. Hemoglobin A1C: No results for input(s): HGBA1C in the last 72 hours. Fasting Lipid Panel:  Recent Labs  09/16/14 0403  TRIG 144   Thyroid Function  Tests: No results for input(s): TSH, T4TOTAL, T3FREE, THYROIDAB in the last 72 hours.  Invalid input(s): FREET3 Anemia Panel: No results for input(s): VITAMINB12, FOLATE, FERRITIN, TIBC, IRON, RETICCTPCT in the last 72 hours. Coag Panel:   Lab Results  Component Value Date   INR 1.11 09/16/2014   INR 1.26 09/13/2014   INR 1.08 2020/08/2514    RADIOLOGY: Dg Chest 1 View  09/12/2014   CLINICAL DATA:  PICC placement.  EXAM: CHEST - 1 VIEW 9:37 p.m.  COMPARISON:  09/12/2014 at 6:57 p.m.  FINDINGS: PICC tip is now  17 mm below the carina in good position.  Heart size and pulmonary vascularity are normal. Minimal atelectasis at the left lung base. Lungs are otherwise clear.  No acute osseous abnormality. Old deformity of the proximal right humerus.  IMPRESSION: PICC in good position.  Minimal atelectasis at the left lung base.   Electronically Signed   By: Geanie CooleyJim  Maxwell M.D.   On: 09/12/2014 22:01   Ct Abdomen Pelvis W Contrast  09/16/2014   ADDENDUM REPORT: 09/16/2014 16:23  ADDENDUM: I discussed the results of this study with Dr. Maisie Fus at approximately 1620 hours on 09/16/2014.   Electronically Signed   By: Kennith Center M.D.   On: 09/16/2014 16:23   09/16/2014   CLINICAL DATA:  Subsequent encounter for longstanding ulcerative colitis with recent diagnosis is sigmoid stricture. Patient is status post total colectomy with end ileostomy on 12-07-2013. Rising white cell count.  EXAM: CT ABDOMEN AND PELVIS WITH CONTRAST  TECHNIQUE: Multidetector CT imaging of the abdomen and pelvis was performed using the standard protocol following bolus administration of intravenous contrast.  CONTRAST:  OMNIPAQUE IOHEXOL 300 MG/ML  SOLN  COMPARISON:  09/04/2014  FINDINGS: Lower chest: Compressive atelectasis is noted in both lower lobes with small bilateral pleural effusions.  Hepatobiliary: No focal abnormality within the liver parenchyma. Gallbladder is distended without evidence for stones. No intrahepatic or  extrahepatic biliary dilation.  Pancreas: No focal mass lesion. No dilatation of the main duct. No intraparenchymal cyst. No peripancreatic edema.  Spleen: No splenomegaly. No focal mass lesion.  Adrenals/Urinary Tract: No adrenal nodule or mass. Bilateral renal cysts are again noted. No hydronephrosis or evidence of hydroureter. Foley catheter decompresses the urinary bladder. Gas in the bladder is compatible with the instrumentation.  Stomach/Bowel: Stomach is moderately distended. Duodenum is normal in appearance. No evidence for small bowel dilatation. No small bowel wall thickening. Gas is visible in the ileum and there is air in the ileal loop tracking out the stoma to the right abdominal and ileostomy. Patient is status post subtotal colectomy. Fluid filled Hartmann's pouch is evident.  Vascular/Lymphatic: Atherosclerotic calcification is noted in the wall of the abdominal aorta without aneurysm. No gastrohepatic or hepatoduodenal ligament lymphadenopathy. No retroperitoneal lymphadenopathy. No evidence for pelvic sidewall lymphadenopathy.  Reproductive: Prostate gland and seminal vesicles are unremarkable.  Other: 4.2 x 4.4 x 4.8 cm focal area of fluid and debris and gas is identified in the right mesentery, just lateral to the superior mesenteric vein. While the remaining areas of intraperitoneal free fluid in the abdomen appear to be more free flowing, this collection appears loculated although it does not have a well-defined or thick rim.  A left abdominal surgical drain tracks down into the pelvis were crosses the midline just caudal to the Hartmann's pouch with the and of the drain coursing along the right pelvic sidewall. There is small volume perihepatic ascites with a small amount of fluid in the right paracolic gutter. Diffuse areas of mesenteric edema are noted, compatible with recent surgery. There is a small amount of free fluid in the mesenteric loops of the pelvis and in some areas this fluid  demonstrates subtle peritoneal enhancement, suggesting associated peritoneal irritation.  The  A small amount of intraperitoneal free air is associated with air in the Right rectus sheath and in the subcutaneous of the right anterior abdominal wall.  Musculoskeletal: Bone windows reveal no worrisome lytic or sclerotic osseous lesions.  IMPRESSION: 4.2 x 4.4 x 4.8 cm focal collection of debris and gas in the right central mesentery, adjacent to the superior mesenteric vein. This particular location has a different appearance than any of the other fluid seen in the abdomen or pelvis and given the history of leukocytosis, raises concern for evolving abscess, although it does not have a well-defined or enhancing rim at this time.  Status post subtotal colectomy with right abdominal end ileostomy. No evidence for bowel obstruction. There is a small amount of ascites adjacent to the liver and in the right  paracolic gutter and subtle peritoneal enhancement is seen along some of the fluid collections although they do not appear to be well organized at this time.  A very small amount of intraperitoneal free air is associated with free air in the rectus sheath and anterior abdominal wall, not unexpected 6 days out from surgery.  Mild dependent atelectasis in the lower lobes bilaterally with small bilateral pleural effusions.  Electronically Signed: By: Kennith CenterEric  Mansell M.D. On: 09/16/2014 15:56   Ct Abdomen Pelvis W Contrast  09/04/2014   CLINICAL DATA:  Ulcerative colitis with intestinal obstruction K51.912 (ICD-10-CM). 73 year old male with past medical history of diabetes, coronary artery disease, history of atrial fibrillation, history of PE, history of syncope and ulcerative colitis present with exacerbation of ulcerative colitis and found to be in a-fibeval for abd pain and colitis  EXAM: CT ABDOMEN AND PELVIS WITH CONTRAST  TECHNIQUE: Multidetector CT imaging of the abdomen and pelvis was performed using the standard  protocol following bolus administration of intravenous contrast.  CONTRAST:  50mL OMNIPAQUE IOHEXOL 300 MG/ML SOLN, 100mL OMNIPAQUE IOHEXOL 300 MG/ML SOLN  COMPARISON:  Plain films 01/13/2009.  Most recent CT of 03/14/2007  FINDINGS: Lower chest: Clear lung bases. Mild cardiomegaly. Right coronary artery atherosclerosis. No pericardial or pleural effusion.  Hepatobiliary: Minimal exclusion of the hepatic dome. Mild hepatic steatosis suspected. No focal liver lesion. Focal steatosis adjacent the falciform ligament. Normal gallbladder, without biliary ductal dilatation.  Pancreas: Mild pancreatic atrophy. A tiny cystic focus in the pancreatic body on image 22 of series 2. Alternatively, this could represent interdigitation of peripancreatic fat. Appearance was likely present on the prior exam, suggesting a benign etiology.  Spleen: Normal  Adrenals/Urinary Tract: Normal adrenal glands. Bilateral renal cysts and too small to characterize lesions. No hydronephrosis. Normal ureters and urinary bladder.  Stomach/Bowel: Normal stomach, without wall thickening. Mild to moderate wall thickening involving the sigmoid and rectum. The descending colon is underdistended, without definite inflammation. There is also wall thickening involving the transverse colon. Hyperemia as evidenced by prominence of the Vasa recta. Ascending colon, terminal ileum, and appendix all normal. Normal small bowel without abdominal ascites.  Vascular/Lymphatic: Moderate aortic and branch vessel atherosclerosis. No retroperitoneal or retrocrural adenopathy. No pelvic adenopathy.  Reproductive:  Mild prostatomegaly.  Other: No significant free fluid. Fat containing left inguinal hernia.  Musculoskeletal: No acute osseous abnormality. 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  IMPRESSION: 1. Left-sided colitis, likely representing active ulcerative colitis. Infectious could look similar. Ischemia felt unlikely,  given distribution and clinical history. 2. Atherosclerosis, including within the coronary arteries. 3. Hepatic steatosis.   Electronically Signed   By: Jeronimo GreavesKyle  Talbot M.D.   On: 09/04/2014 17:06   Dg Chest Port 1 View  09/12/2014   CLINICAL DATA:  Confirm PICC line placement.  EXAM: PORTABLE CHEST - 1 VIEW  COMPARISON:  09/06/2014  FINDINGS: There is a right arm PICC line. The catheter tip appears to be in the right atrium. The catheter could be pulled back 6 cm for positioning in the lower SVC. Low lung volumes. No focal airspace disease. Heart size is stable.  IMPRESSION: PICC line tip in the right atrium.   Electronically Signed   By: Richarda OverlieAdam  Henn M.D.   On: 09/12/2014 19:15   Dg Chest Port 1 View  09/06/2014   CLINICAL DATA:  73 year old male status post PICC line placement. Initial encounter.  EXAM: PORTABLE CHEST - 1 VIEW  COMPARISON:  CT Abdomen and Pelvis 09/04/2014.  FINDINGS: Portable AP semi upright view at 1553 hrs. Right side PICC line placed, tip projects just below the carina. Normal cardiac size and mediastinal contours. Visualized tracheal air column is within normal limits. No pneumothorax. Somewhat low lung volumes. Mild increased interstitial markings, stable. Otherwise Allowing for portable technique, the lungs are clear.  IMPRESSION: 1. Right PICC line placed, tip at the SVC level. 2.  No acute cardiopulmonary abnormality.   Electronically Signed   By: Augusto Gamble M.D.   On: 09/06/2014 16:21      ASSESSMENT: Roosvelt Harps:    1) AFib: rate controlled.  Hbg stable.  Would benefit from anticoagulation in order to prevent stroke.  In reviewing the initial consult note from Dr. Clifton James, it was noted that the patient has been off of anticoagulation for many years due to his ulcerative colitis. If surgery feels that anticoagulation is safe from a bleeding standpoint, could initiate warfarin per pharmacy consult. Would also have to take into account his long-term bleeding risk from the ulcerative  colitis as well.  Continue IV heparin for now.  Long term Bleeding risk is lower after the surgery but he still has UC disease in the rectum.  Will only think about Coumadin when surgical issues clear.  Switch to oral metoprolol when tolerating pills.    Corky Crafts., MD  09/18/2014  7:49 AM

## 2014-09-18 NOTE — Plan of Care (Signed)
Problem: Problem: Skin/Wound Progression Goal: HEALING PRESSURE ULCER Outcome: Progressing     

## 2014-09-18 NOTE — Progress Notes (Signed)
CSW assisting with d/ planning. PN reviewed. Met with pt /family. CSW cell # provided . [name], Ronald Mayo,  is main contact for assistance  with d/c planning. Pt / family are interested in rehab at [facility] at d/c. CSW will contact SNF with updated clinicals. Pt is not stable for d/c at this time. CSW will continue to follow to assist with d/c planning to SNF.    LCSW [phone] 

## 2014-09-18 NOTE — Progress Notes (Addendum)
PARENTERAL NUTRITION CONSULT NOTE   Pharmacy Consult for TPN Indication: Severe ulcerative colitis and malnutrition  No Known Allergies  Patient Measurements: Height: 6' (182.9 cm) Weight: 229 lb 8 oz (104.1 kg) IBW/kg (Calculated) : 77.6 Adjusted Body Weight:  Usual Weight: ~ 246lbs (30 lb wt loss over 3 months)  Vital Signs: Temp: 98 F (36.7 C) (11/11 0628) Temp Source: Oral (11/11 0628) BP: 131/66 mmHg (11/11 0628) Pulse Rate: 98 (11/11 0628) Intake/Output from previous day: 11/10 0701 - 11/11 0700 In: 593 [I.V.:143; IV Piggyback:10; TPN:440] Out: 2585 [Urine:1475; Emesis/NG output:1050; Drains:10; Stool:50] Intake/Output from this shift: Total I/O In: -  Out: 535 [Urine:465; Drains:20; Stool:50] Labs:  Recent Labs  09/16/14 0403 09/16/14 1145 09/17/14 0540 09/18/14 0540  WBC 23.3*  --  18.6* 18.6*  HGB 9.7*  --  9.2* 8.4*  HCT 28.2*  --  27.4* 25.2*  PLT 429*  --  473* 479*  APTT  --  38*  --   --   INR  --  1.11  --   --     Recent Labs  09/16/14 0403 09/17/14 0540  NA 135* 135*  K 4.5 4.4  CL 95* 95*  CO2 32 31  GLUCOSE 146* 169*  BUN 16 16  CREATININE 0.59 0.60  CALCIUM 8.1* 8.3*  MG 2.0  --   PHOS 4.4  --   PROT 5.0*  --   ALBUMIN 1.1*  --   AST 14  --   ALT 16  --   ALKPHOS 81  --   BILITOT 1.1  --   PREALBUMIN 5.0*  --   TRIG 144  --    Estimated Creatinine Clearance: 104.1 mL/min (by C-G formula based on Cr of 0.6).    Recent Labs  09/17/14 2318 09/18/14 0320 09/18/14 0811  GLUCAP 98 78 90   Medical History: Past Medical History  Diagnosis Date  . 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 Diabetes mellitus   . CAD (coronary artery disease)   . Personal history of other malignant neoplasm of skin   . Atrial fibrillation   . 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  Colonoscopy reveals severe UC and proctitis with friable strictures.  Biopsies consistent with UC and not malignancy. General surgery consulted and plan is for proctocolectomy and J-pouch.  Pharmacy asked to start TPN to improve nutritional status for surgery. History of diabetes on glimepiride prior to admission, glimepiride held, using Novolog correction scale  Significant events:  10/30: TNA started at 40 ml/hr 11/1: advance TNA to 60 m/hr, removed regular  insulin from TNA and added Lantus BID 11/3: total colectomy with ileostomy 11/5: TNA at goal of 14700ml/hr, lantus dose decreased and regular insulin added to TNA  Today:   Glucose - CBGs at goal < 150. CBGs 78-119.  Electrolytes - No new labs today  Renal - No new labs today, SCr 11/10 WNL, I/O = - 1192 mL  LFTs - WNL  TGs - 96 (11/2), 144 (11/9)  Prealbumin - 12.7 (10/30), 10.3 (11/2), 5 (11/9)  NUTRITIONAL GOALS                                                                                             RD recs: 2200-2400 kcals, 115-130gm protein Clinimix 5/15 at a goal rate of 16200ml/hr + 20% fat emulsion at 310ml/hr to provide: 120g/day protein, 2184Kcal/day.  PLAN                                                                                                                         At 1800 today:  Continue Clinimix E 5/15 at goal 16100ml/hr.  CBGs significantly improved (from 160s-180s past several days).  To avoid risk of hypoglycemia, will hold Lantus for now.  Decrease insulin to 75 units regular insulin in 3L TPN bag (25 units/L) - to deliver 60 units regular insulin/24h  20% fat emulsion at 6010ml/hr.  TNA to contain standard multivitamins and trace elements.  [facility]    Continue moderate SSI/CBGs to q4h  TNA lab panels on Mondays & Thursdays.  F/u daily.   Greer PickerelJigna Hadley Soileau, PharmD, BCPS Pager: [phone] 09/18/2014 11:06 AM

## 2014-09-18 NOTE — Progress Notes (Addendum)
ANTICOAGULATION CONSULT NOTE - Follow Up Consult  Pharmacy Consult for Heparin Indication: atrial fibrillation  No Known Allergies  Patient Measurements: Height: 6' (182.9 cm) Weight: 229 lb 8 oz (104.1 kg) IBW/kg (Calculated) : 77.6 Heparin Dosing Weight: 98.1 kg  Vital Signs: Temp: 98 F (36.7 C) (11/11 0628) Temp Source: Oral (11/11 0628) BP: 131/66 mmHg (11/11 0628) Pulse Rate: 98 (11/11 0628)  Labs:  Recent Labs  09/16/14 0403 09/16/14 1145  09/17/14 0540 09/17/14 1446 09/18/14 0540  HGB 9.7*  --   --  9.2*  --  8.4*  HCT 28.2*  --   --  27.4*  --  25.2*  PLT 429*  --   --  473*  --  479*  APTT  --  38*  --   --   --   --   LABPROT  --  14.4  --   --   --   --   INR  --  1.11  --   --   --   --   HEPARINUNFRC  --   --   < > 0.26* 0.32 0.33  CREATININE 0.59  --   --  0.60  --   --   < > = values in this interval not displayed.  Estimated Creatinine Clearance: 104.1 mL/min (by C-G formula based on Cr of 0.6).  Assessment: 73yoM s/p total colectomy, permanent ileostomy on 11/3 with history of afib, rate controlled. Pt has been off anticoagulation for many years d/t ulcerative colitis. Pharmacy consulted to dose heparin gtt on 11/9.  11/9: First heparin level 0.11, subtherapeutic, bolus given + rate increased to 1700 units/hr 11/10: 0540 HL = 0.26, slightly low, rate increased to 1900 units/hr; 1446 HL = 0.32-therapeutic, no change; rectal bleeding in PM  Today, 11/11: 0540 HL = 0.33-therapeutic. H/H trending down, 8.4/25.2 this AM.  Goal of Therapy:  Heparin level 0.3-0.7 units/ml Monitor platelets by anticoagulation protocol: Yes   Plan:   Plan is to hold heparin gtt for now per surgery and f/u with H/H later today to determine ? restart.   Greer PickerelJigna Davius Goudeau, PharmD, BCPS Pager: [phone] 09/18/2014 10:06 AM    Addendum:  Repeat H/H at 1400: 8.2/25. RN reports patient still having rectal bleeding. Per D/W Zola ButtonWill Jennings, PA with Surgery-hold heparin  gtt for now. Surgery to reassess patient in AM and restart if/when appropriate.  Greer PickerelJigna Tanette Chauca, PharmD, BCPS Pager: [phone] 09/18/2014 3:12 PM

## 2014-09-18 NOTE — Consult Note (Signed)
WOC wound follow up Wound type: Chronic venous insufficiency ulcerations, left medial malleolus, left heel. Measurement: medial malleolar ulceration has healed.  Left heel ulcer measures 0.5cm x 0.8cm, x 0.4cm Wound QIO:NGEXBbed:clean, pink, moist.  Drainage (amount, consistency, odor) scant serous. Periwound:Dried epithelium is gently removed from wound periphery. Dressing procedure/placement/frequency:Regranex applied following cleanse with NS< and pat dry. This is topped with a soft silicone foam dressing.  Patient requests that we leave off compression hosiery at this time.  He agrees to [employer] boot for the right heel.  I have ordered same today.  WOC ostomy follow up Stoma type/location: RUQ ileostomy  Stomal assessment/size: 1 and 5/8 inches round, red, slightly budded Peristomal assessment: intact, clear Treatment options for stomal/peristomal skin: none indicated Output small amount light brown stool Ostomy pouching: 1pc. Cut to fit flat pouch [place] [phone]).  We may wish to add a skin barrier ring with next pouch change. Education provided: Patient able to perform Lock and Roll closure with cueing today.  Did not immediately recall from last week. [place] in [employer]:  No WOC nursing team will follow, and will remain available to this patient, the nursing, surgical  and medical teams.  Thanks, Ladona MowLaurie Shamel Germond, MSN, RN, GNP, [place], CWON-AP [phone]))

## 2014-09-18 NOTE — Progress Notes (Signed)
Occupational Therapy Treatment Patient Details Name: Ronald Mayo MRN: [id number] DOB: [birth year] Today's Date: 09/18/2014    History of present illness 73 y.o. male with h/o ulcerative colitis, DM, CAD, a fib, chronic diarrhea, chronic LLE wound admitted with bloody stools, abdominal pain and significant weight loss. Dx of colon stricture. 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 PT Re-eval-09/11/14   OT comments  Pt agreeable to moving, getting to EOB.  Not interested in any ADL activities this pm.  Pt very HOH  Follow Up Recommendations  SNF    Equipment Recommendations  3 in 1 bedside comode    Recommendations for Other Services      Precautions / Restrictions Precautions Precautions: Fall Precaution Comments: chronic wound L heel, pt wears compression stocking. Multiple lines/leads, L abdominal drain. NG tube       Mobility Bed Mobility Overal bed mobility: Needs Assistance Bed Mobility: Supine to Sit;Sit to Supine     Supine to sit: Min assist Sit to supine: +2 for safety/equipment;Mod assist   General bed mobility comments: assist for trunk for OOB; assist for Bil LEs for back to bed.  RN watched lines/trunk  Transfers Overall transfer level: Needs assistance Equipment used: Rolling walker (2 wheeled) Transfers: Sit to/from Stand Sit to Stand: Min assist         General transfer comment: assist to rise, steady and control descent to high bed surface    Balance                                   ADL                                         General ADL Comments: Pt sat at EOB--did not want to perform any adl activities.  Stood and sidestepped to get up higher in bed before lying down. RN assisted with bed mobility for back to bed.  Encouraged rolling, but pt prefers to sit up.  Raised HOB to decrease stress on abdomen      Vision                     Perception     Praxis      Cognition   Behavior  During Therapy: [facility]FL for tasks assessed/performed Overall Cognitive Status: Within Functional Limits for tasks assessed                       Extremity/Trunk Assessment               Exercises     Shoulder Instructions       General Comments      Pertinent Vitals/ Pain       Pain Assessment: 0-10 Pain Score: 4  Pain Location: abdomen Pain Descriptors / Indicators: Sore Pain Intervention(s): Limited activity within patient's tolerance;Monitored during session;PCA encouraged;Repositioned  Home Living                                          Prior Functioning/Environment              Frequency Min 2X/week     Progress Toward Goals  OT Goals(current goals can now be  found in the care plan section)  Progress towards OT goals: Progressing toward goals     Plan      Co-evaluation                 End of Session     Activity Tolerance Patient tolerated treatment well   Patient Left in bed;with call bell/phone within reach;with bed alarm set   Nurse Communication          Time: [id number] OT Time Calculation (min): 22 min  Charges: OT General Charges $OT Visit: 1 Procedure OT Treatments $Therapeutic Activity: 8-22 mins  Regino Fournet 09/18/2014, 3:15 PM  Marica OtterMaryellen Suriyah Vergara, OTR/L [phone] 09/18/2014

## 2014-09-18 NOTE — Progress Notes (Signed)
8 Days Post-Op  Subjective: Rectal bleeding last PM, NG working, small amount of stool in the ileostomy.  Open wound is purulent, but currently the sutures and fascia seem to be intact.  He says his abdomen continues to hurt.  Objective: Vital signs in last 24 hours: Temp:  [98 F (36.7 C)-98.7 F (37.1 C)] 98 F (36.7 C) (11/11 0628) Pulse Rate:  [85-106] 98 (11/11 0628) Resp:  [14-20] 17 (11/11 0806) BP: (108-158)/(56-76) 131/66 mmHg (11/11 0628) SpO2:  [95 %-100 %] 95 % (11/11 0806) Last BM Date: 09/18/14 (via ileostomy) 50 recorded from ileostomy, 1050 from NG TNA/NPO AFEBRILE, VSS Last CT 09/16/14:  4.2 x 4.4 x 4.8 cm focal collection of debris and gas in the right central mesentery On Zosyn Intake/Output from previous day: 11/10 0701 - 11/11 0700 In: 593 [I.V.:143; IV Piggyback:10; TPN:440] Out: 2585 [Urine:1475; Emesis/NG output:1050; Drains:10; Stool:50] Intake/Output this shift: Total I/O In: -  Out: 75 [Urine:15; Drains:10; Stool:50]  General appearance: alert, cooperative, no distress and hard of hearing  Resp: clear to auscultation bilaterally and anterior GI: large abdomen, sore, open wound has allot of purulent drainage and pockets of drainage.  Fascia still appears intact.    Lab Results:   Recent Labs  09/17/14 0540 09/18/14 0540  WBC 18.6* 18.6*  HGB 9.2* 8.4*  HCT 27.4* 25.2*  PLT 473* 479*    BMET  Recent Labs  09/16/14 0403 09/17/14 0540  NA 135* 135*  K 4.5 4.4  CL 95* 95*  CO2 32 31  GLUCOSE 146* 169*  BUN 16 16  CREATININE 0.59 0.60  CALCIUM 8.1* 8.3*   PT/INR  Recent Labs  09/16/14 1145  LABPROT 14.4  INR 1.11     Recent Labs Lab 09/12/14 0520 09/16/14 0403  AST 9 14  ALT 12 16  ALKPHOS 61 81  BILITOT <0.2* 1.1  PROT 5.0* 5.0*  ALBUMIN 1.4* 1.1*     Lipase     Component Value Date/Time   LIPASE 22.0 03/03/2007 0941     Studies/Results: Ct Abdomen Pelvis W Contrast  09/16/2014   ADDENDUM REPORT: 09/16/2014  16:23  ADDENDUM: I discussed the results of this study with Dr. Maisie Fushomas at approximately 1620 hours on 09/16/2014.   Electronically Signed   By: Kennith CenterEric  Mansell M.D.   On: 09/16/2014 16:23   09/16/2014   CLINICAL DATA:  Subsequent encounter for longstanding ulcerative colitis with recent diagnosis is sigmoid stricture. Patient is status post total colectomy with end ileostomy on 05-01-2014. Rising white cell count.  EXAM: CT ABDOMEN AND PELVIS WITH CONTRAST  TECHNIQUE: Multidetector CT imaging of the abdomen and pelvis was performed using the standard protocol following bolus administration of intravenous contrast.  CONTRAST:  100mL OMNIPAQUE IOHEXOL 300 MG/ML  SOLN  COMPARISON:  09/04/2014  FINDINGS: Lower chest: Compressive atelectasis is noted in both lower lobes with small bilateral pleural effusions.  Hepatobiliary: No focal abnormality within the liver parenchyma. Gallbladder is distended without evidence for stones. No intrahepatic or extrahepatic biliary dilation.  Pancreas: No focal mass lesion. No dilatation of the main duct. No intraparenchymal cyst. No peripancreatic edema.  Spleen: No splenomegaly. No focal mass lesion.  Adrenals/Urinary Tract: No adrenal nodule or mass. Bilateral renal cysts are again noted. No hydronephrosis or evidence of hydroureter. Foley catheter decompresses the urinary bladder. Gas in the bladder is compatible with the instrumentation.  Stomach/Bowel: Stomach is moderately distended. Duodenum is normal in appearance. No evidence for small bowel dilatation. No small bowel wall  thickening. Gas is visible in the ileum and there is air in the ileal loop tracking out the stoma to the right abdominal and ileostomy. Patient is status post subtotal colectomy. Fluid filled Hartmann's pouch is evident.  Vascular/Lymphatic: Atherosclerotic calcification is noted in the wall of the abdominal aorta without aneurysm. No gastrohepatic or hepatoduodenal ligament lymphadenopathy. No  retroperitoneal lymphadenopathy. No evidence for pelvic sidewall lymphadenopathy.  Reproductive: Prostate gland and seminal vesicles are unremarkable.  Other: 4.2 x 4.4 x 4.8 cm focal area of fluid and debris and gas is identified in the right mesentery, just lateral to the superior mesenteric vein. While the remaining areas of intraperitoneal free fluid in the abdomen appear to be more free flowing, this collection appears loculated although it does not have a well-defined or thick rim.  A left abdominal surgical drain tracks down into the pelvis were crosses the midline just caudal to the Hartmann's pouch with the and of the drain coursing along the right pelvic sidewall. There is small volume perihepatic ascites with a small amount of fluid in the right paracolic gutter. Diffuse areas of mesenteric edema are noted, compatible with recent surgery. There is a small amount of free fluid in the mesenteric loops of the pelvis and in some areas this fluid demonstrates subtle peritoneal enhancement, suggesting associated peritoneal irritation.  The  A small amount of intraperitoneal free air is associated with air in the Right rectus sheath and in the subcutaneous of the right anterior abdominal wall.  Musculoskeletal: Bone windows reveal no worrisome lytic or sclerotic osseous lesions.  IMPRESSION: 4.2 x 4.4 x 4.8 cm focal collection of debris and gas in the right central mesentery, adjacent to the superior mesenteric vein. This particular location has a different appearance than any of the other fluid seen in the abdomen or pelvis and given the history of leukocytosis, raises concern for evolving abscess, although it does not have a well-defined or enhancing rim at this time.  Status post subtotal colectomy with right abdominal end ileostomy. No evidence for bowel obstruction. There is a small amount of ascites adjacent to the liver and in the right paracolic gutter and subtle peritoneal enhancement is seen along some  of the fluid collections although they do not appear to be well organized at this time.  A very small amount of intraperitoneal free air is associated with free air in the rectus sheath and anterior abdominal wall, not unexpected 6 days out from surgery.  Mild dependent atelectasis in the lower lobes bilaterally with small bilateral pleural effusions.  Electronically Signed: By: Kennith Center M.D. On: 09/16/2014 15:56    Medications: . acetaminophen  1,000 mg Oral TID  . becaplermin   Topical Q7 days  . citalopram  20 mg Oral Daily  . HYDROmorphone PCA 0.3 mg/mL   Intravenous 6 times per day  . insulin aspart  0-15 Units Subcutaneous 6 times per day  . lip balm  1 application Topical BID  . metoprolol  2.5 mg Intravenous 4 times per day  . pantoprazole (PROTONIX) IV  40 mg Intravenous Q24H  . piperacillin-tazobactam (ZOSYN)  IV  3.375 g Intravenous 3 times per day  . sodium chloride  10-40 mL Intracatheter Q12H    Assessment/Plan 1. 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 Leone Payor - 09/04/2014 [note that this stricture was documented in 2011 on a colonoscopy attempt. S/p Total abdominal colectomy with end ileostomy, Dr. Harden Mo, 09/10/14. Concern for stump leak, based on drainage from  the JP/CT findings: 4.2 x 4.4 x 4.8 cm focal collection of debris and gas in the right central mesentery 2. Anemia - Hgb - 10/6 - 09/07/2014 transfused 09/14/14 3. Malnutrition - Prealbumin- 12.7 On TNA 4. DM 5. CAD 6. Chronic History of A. Fib rate in the 80's. 7. Very hard of hearing 8. DVT prophylaxis - on no chemoprophylaxis due to UC 9.  Rectal bleeding on heparin last PM.  Plan:  I will recheck his hemoglobin later today, Hold heparin for now and see where he is with H/H.  He is starting day 3 of Zosyn, but clearly has a leak from JP drain, and purulent fluid open abdominal wound.  Ask Medicine to see again and help now that he is not  being followed by CCM.  Continue TNA/NG/NPO.  Increase dressing changes.  Culture from abdomen is being ordered, I expect multiple organisms.  If he does not show improvement we may need to repeat CT scan soon.    Abdominal wound this AM.      LOS: 16 days    Ester Mabe 09/18/2014

## 2014-09-18 NOTE — Progress Notes (Addendum)
Patient ID: Ronald Mayo, male   DOB: 1941/07/09, 73 y.o.   MRN: [id number]  [facility] PROGRESS NOTE  EITHAN BEAGLE WUJ:[id number] DOB: 18-Apr-1941 DOA: 09/02/2014 PCP: Aniceto Boss, MD  Brief narrative: 73 year old male with ulcerative colitis on sulfasalazine, DM type II (on Amaryl), presented 10/26 with main concern of several weeks duration or persistent diarrhea and more recently blood in stool. This has been associated with poor oral intake, malaise, weight loss, subjective fevers, chills, intermittent abd cramping, non radiation, with no specific alleviating or aggravating factors. Ct abd and pelvis c/w active UC. Pt underwent  total abdominal colectomy with end ileostomy by Dr. Dwain Sarna 11/03. Now with concern for post op stump leak, based on drainage from the JP/CT findings: 4.2 x 4.4 x 4.8 cm focal collection of debris and gas in the right central mesentery. Due to complex chronic medical conditions imposed on new acute complications noted below, will change attending team to [facility]. Discussed with surgery team.   Assessment and Plan:    Impression/Recommendations Active Problems:  Ulcerative colitis - with sigmoid stricture - on colonoscopy by Dr. Leone Payor - 09/04/2014  - pt is now s/p total abdominal colectomy with end ileostomy by Dr. Dwain Sarna 11/03 - concern of post op stump leak, based on drainage from the JP/CT findings: 4.2 x 4.4 x 4.8 cm focal collection of debris and gas in the right central mesentery - management per primary team - continue Zosyn day #3 - pt currently on TNA, keep NPO  - abdominal cultures still pending  Acute on chronic blood loss anemia, UC with post op bleed - Hg trending down over the past 72 hours: 9.7 --> 9.2 --> 8.4 --> 8.2 - close monitoring and repeat CBC in AM Leukocytosis - secondary to the above, active problem - WBC trending down - continue Zosyn day #3, abd cultures pending  - CBC in AM DM type II - pt at home on Amaryl, on  hold  - continue only SSI for now Protein-calorie malnutrition, severe  - secondary to progressive nature of UC and acute illness - NPO for now with TNA  Chronic venous insufficiency ulcerations, left medial malleolus, left heel. - Measurement: medial malleolar ulceration has healed. Left heel ulcer measures 0.5cm x 0.8cm, x 0.4cm - Wound NFA:OZHYQ, pink, moist, mild drainage serous  - recommend regranex applied following cleanse with NS, and pat dry. This is topped with a soft silicone foam dressing - appreciate wound care team assistance  Ostomy  - RUQ ileostomy, 1 and 5/8 inches round, red, slightly budded - appreciate wound care team help   DVT prophylaxis  SCD  Code Status: Full Family Communication: Pt at bedside Disposition Plan: Remains inpatient   IV Access:   Peripheral IV Procedures and diagnostic studies:    Ct Abdomen Pelvis W Contrast  09/16/2014   4.2 x 4.4 x 4.8 cm focal collection of debris and gas in the right central mesentery, adjacent to the superior mesenteric vein. This particular location has a different appearance than any of the other fluid seen in the abdomen or pelvis and given the history of leukocytosis, raises concern for evolving abscess, although it does not have a well-defined or enhancing rim at this time.  Status post subtotal colectomy with right abdominal end ileostomy. No evidence for bowel obstruction. There is a small amount of ascites adjacent to the liver and in the right paracolic gutter and subtle peritoneal enhancement is seen along some of the fluid collections although they  do not appear to be well organized at this time.  A very small amount of intraperitoneal free air is associated with free air in the rectus sheath and anterior abdominal wall, not unexpected 6 days out from surgery.   Ct Abdomen Pelvis W Contrast  09/04/2014 Left-sided colitis, likely representing active ulcerative colitis. Infectious could look similar. Ischemia felt  unlikely, given distribution and clinical history.Atherosclerosis, coronary arteries  Dg Chest Port 1 View  09/12/2014  PICC line tip in the right atrium.   Dg Chest Port 1 View  09/06/2014 Right PICC line placed, tip at the SVC level.  No acute cardiopulmonary abnormality.  Medical Consultants:   Surgery [facility] Other Consultants:   PT - recommend SNF upon discharge OT - recommend SNF upon discharge Wound care team  Anti-Infectives:   Zosyn 11/09 -->  Debbora PrestoMAGICK-MYERS, ISKRA, MD  [facility] Pager [phone]  If 7PM-7AM, please contact night-coverage [email or web address] Password [email or web address] 09/18/2014, 12:45 PM   LOS: 16 days   HPI/Subjective: No events overnight.   Objective: Filed Vitals:   09/18/14 0633 09/18/14 0806 09/18/14 1200 09/18/14 1205  BP:   121/64   Pulse:   101   Temp:   98.2 F (36.8 C)   TempSrc:   Oral   Resp: 16 17 18 21   Height:      Weight:      SpO2: 98% 95% 98% 97%    Intake/Output Summary (Last 24 hours) at 09/18/14 1245 Last data filed at 09/18/14 1000  Gross per 24 hour  Intake     48 ml  Output   1895 ml  Net  -1847 ml    Exam:   General:  Pt is alert, follows commands appropriately, not in acute distress, HOH   Cardiovascular: Regular rhythm, tachycardic, S1/S2, no murmurs, no rubs, no gallops  Respiratory: Clear to auscultation bilaterally, no wheezing, diminished breath sounds at bases   Abdomen: large abdomen, sore, open wound with purulent drainage and pockets of drainag   Extremities: No edema, pulses DP and PT palpable bilaterally  Data Reviewed: Basic Metabolic Panel:  Recent Labs Lab 09/12/14 0520 09/13/14 0500 09/13/14 0610 09/14/14 1100 09/16/14 0403 09/17/14 0540  NA 138 135*  --  134* 135* 135*  K 4.8 3.8  --  4.2 4.5 4.4  CL 100 94*  --  94* 95* 95*  CO2 32 32  --  32 32 31  GLUCOSE 170* 150*  --  170* 146* 169*  BUN 20 17  --  15 16 16   CREATININE 0.66 0.54  --  0.54 0.59 0.60  CALCIUM 8.5 8.4  --  8.2* 8.1* 8.3*  MG 2.2   --  2.0  --  2.0  --   PHOS 2.9  --  3.2  --  4.4  --    Liver Function Tests:  Recent Labs Lab 09/12/14 0520 09/16/14 0403  AST 9 14  ALT 12 16  ALKPHOS 61 81  BILITOT <0.2* 1.1  PROT 5.0* 5.0*  ALBUMIN 1.4* 1.1*   CBC:  Recent Labs Lab 09/14/14 1100 09/15/14 0919 09/16/14 0403 09/17/14 0540 09/18/14 0540  WBC 20.8* 20.3* 23.3* 18.6* 18.6*  NEUTROABS  --   --  18.8*  --   --   HGB 9.5* 9.5* 9.7* 9.2* 8.4*  HCT 28.0* 27.9* 28.2* 27.4* 25.2*  MCV 91.2 90.6 90.7 90.7 92.3  PLT 365 387 429* 473* 479*   CBG:  Recent Labs Lab 09/17/14 1944 09/17/14 2318 09/18/14 0320  09/18/14 0811 09/18/14 1133  GLUCAP 119* 98 78 90 107*    Scheduled Meds: . acetaminophen  1,000 mg Oral TID  . citalopram  20 mg Oral Daily  . HYDROmorphone pca    Intravenous 6 times per day  . insulin aspart  0-15 Units Subcutaneous 6 times per day  . metoprolol  2.5 mg Intravenous 4 times per day  . Pantoprazole IV  40 mg Intravenous Q24H  . ZOSYN  IV  3.375 g Intravenous 3 times per day   Continuous Infusions: . Marland Kitchen.TPN (CLINIMIX-E) Adult 100 mL/hr at 09/17/14 1730  .  fat emulsion 250 mL (09/17/14 1730)

## 2014-09-18 NOTE — Progress Notes (Signed)
Physical Therapy Treatment Patient Details Name: Ronald QuarryWilliam M Wishon MRN: [id number] DOB: 09-Oct-1941 Today's Date: 09/18/2014    History of Present Illness 73 y.o. male with h/o ulcerative colitis, DM, CAD, a fib, chronic diarrhea, chronic LLE wound admitted with bloody stools, abdominal pain and significant weight loss. Dx of colon stricture. 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 PT Re-eval-09/11/14    PT Comments    Pt very HOH however agreeable to activity as tolerated.  Pt only able to tolerate short distance ambulation due to pain and fatigue and requested back to bed upon return to room.  RN in with pt upon leaving room.  Follow Up Recommendations  SNF     Equipment Recommendations  None recommended by PT    Recommendations for Other Services       Precautions / Restrictions Precautions Precautions: Fall Precaution Comments: chronic wound L heel, pt wears compression stocking. Multiple lines/leads, L abdominal drain. NG tube    Mobility  Bed Mobility Overal bed mobility: Needs Assistance Bed Mobility: Supine to Sit;Sit to Supine     Supine to sit: Min assist Sit to supine: Mod assist   General bed mobility comments: assist for trunk upright, assist for bil LEs onto bed  Transfers Overall transfer level: Needs assistance Equipment used: Rolling walker (2 wheeled) Transfers: Sit to/from Stand Sit to Stand: Min assist;+2 safety/equipment         General transfer comment: assist to rise and steady, lowered to elevated bed surface for sitting, verbal cues for hand placement to self assist  Ambulation/Gait Ambulation/Gait assistance: Min assist;+2 safety/equipment Ambulation Distance (Feet): 40 Feet Assistive device: Rolling walker (2 wheeled) Gait Pattern/deviations: Step-to pattern Gait velocity: decr   General Gait Details: step to pattern with L LE leading, decreased DF ?foot drop, hx of heel wound, limited distance due to pain and fatigue     Stairs            Wheelchair Mobility    Modified Rankin (Stroke Patients Only)       Balance                                    Cognition Arousal/Alertness: Awake/alert Behavior During Therapy: Flat affect Overall Cognitive Status: Within Functional Limits for tasks assessed                      Exercises      General Comments        Pertinent Vitals/Pain Pain Assessment: 0-10 Pain Score: 6  Pain Location: abdomen Pain Descriptors / Indicators: Sore;Spasm;Grimacing Pain Intervention(s): Limited activity within patient's tolerance;Monitored during session;PCA encouraged    Home Living                      Prior Function            PT Goals (current goals can now be found in the care plan section) Acute Rehab PT Goals PT Goal Formulation: With patient Time For Goal Achievement: 10/02/14 Potential to Achieve Goals: Good Progress towards PT goals: Progressing toward goals    Frequency  Min 3X/week    PT Plan Current plan remains appropriate    Co-evaluation             End of Session Equipment Utilized During Treatment: Oxygen Activity Tolerance: Patient limited by fatigue;Patient limited by pain Patient left: in bed;with call bell/phone within  reach;with nursing/sitter in room     Time: 1045-1108 PT Time Calculation (min) (ACUTE ONLY): 23 min  Charges:  $Gait Training: 23-37 mins                    G Codes:      Illiana Losurdo,KATHrine E 09/18/2014, 12:47 PM Zenovia JarredKati Keitha Kolk, PT, DPT 09/18/2014 Pager: [phone]

## 2014-09-19 ENCOUNTER — Inpatient Hospital Stay (HOSPITAL_COMMUNITY): Payer: Medicare PPO

## 2014-09-19 LAB — CBC
HCT: 26.4 % — ABNORMAL LOW (ref 39.0–52.0)
Hemoglobin: 8.7 g/dL — ABNORMAL LOW (ref 13.0–17.0)
MCH: 30.4 pg (ref 26.0–34.0)
MCHC: 33 g/dL (ref 30.0–36.0)
MCV: 92.3 fL (ref 78.0–100.0)
PLATELETS: 558 10*3/uL — AB (ref 150–400)
RBC: 2.86 MIL/uL — ABNORMAL LOW (ref 4.22–5.81)
RDW: 14.3 % (ref 11.5–15.5)
WBC: 15.6 10*3/uL — ABNORMAL HIGH (ref 4.0–10.5)

## 2014-09-19 LAB — COMPREHENSIVE METABOLIC PANEL
ALT: 16 U/L (ref 0–53)
AST: 22 U/L (ref 0–37)
Albumin: 1.4 g/dL — ABNORMAL LOW (ref 3.5–5.2)
Alkaline Phosphatase: 108 U/L (ref 39–117)
Anion gap: 10 (ref 5–15)
BUN: 16 mg/dL (ref 6–23)
CO2: 30 mEq/L (ref 19–32)
Calcium: 8.4 mg/dL (ref 8.4–10.5)
Chloride: 93 mEq/L — ABNORMAL LOW (ref 96–112)
Creatinine, Ser: 0.66 mg/dL (ref 0.50–1.35)
GFR calc Af Amer: >90 mL/min (ref >90)
GFR calc non Af Amer: >90 mL/min (ref >90)
Glucose, Bld: 138 mg/dL — ABNORMAL HIGH (ref 70–99)
Potassium: 4.2 mEq/L (ref 3.7–5.3)
Sodium: 133 mEq/L — ABNORMAL LOW (ref 137–147)
Total Bilirubin: 0.3 mg/dL (ref 0.3–1.2)
Total Protein: 5.9 g/dL — ABNORMAL LOW (ref 6.0–8.3)

## 2014-09-19 LAB — PHOSPHORUS: Phosphorus: 4.8 mg/dL — ABNORMAL HIGH (ref 2.3–4.6)

## 2014-09-19 LAB — GLUCOSE, CAPILLARY
Glucose-Capillary: 119 mg/dL — ABNORMAL HIGH (ref 70–99)
Glucose-Capillary: 120 mg/dL — ABNORMAL HIGH (ref 70–99)
Glucose-Capillary: 125 mg/dL — ABNORMAL HIGH (ref 70–99)
Glucose-Capillary: 128 mg/dL — ABNORMAL HIGH (ref 70–99)
Glucose-Capillary: 133 mg/dL — ABNORMAL HIGH (ref 70–99)
Glucose-Capillary: 134 mg/dL — ABNORMAL HIGH (ref 70–99)

## 2014-09-19 LAB — MAGNESIUM: Magnesium: 2.2 mg/dL (ref 1.5–2.5)

## 2014-09-19 MED ORDER — TRACE MINERALS CR-CU-F-FE-I-MN-MO-SE-ZN IV SOLN
INTRAVENOUS | Status: AC
  Administered 2014-09-19: 18:00:00 via INTRAVENOUS
  Filled 2014-09-19: qty 3000

## 2014-09-19 MED ORDER — FAT EMULSION 20 % IV EMUL
250.0000 mL | INTRAVENOUS | Status: AC
  Administered 2014-09-19: 250 mL via INTRAVENOUS
  Filled 2014-09-19: qty 250

## 2014-09-19 NOTE — Progress Notes (Signed)
SUBJECTIVE:  "Feels better." No specific cardiac complaints.  No palpitations.  Rate better.  CT showed abscess.  NG tube  placed.   OBJECTIVE:   Vitals:   Filed Vitals:   09/19/14 0000 09/19/14 0303 09/19/14 0306 09/19/14 0534  BP:    114/60  Pulse:    92  Temp:    97.8 F (36.6 C)  TempSrc:    Oral  Resp: 22 16 24 19   Height:      Weight:      SpO2: 98% 98% 98% 97%   I&O's:    Intake/Output Summary (Last 24 hours) at 09/19/14 0749 Last data filed at 09/19/14 0534  Gross per 24 hour  Intake   1639 ml  Output   3755 ml  Net  -2116 ml        PHYSICAL EXAM General: Well developed, well nourished, in no acute distress Head:   Normal cephalic and atramatic  Lungs:  No wheezing Heart:  irregular S1 S2  No JVD.  80 apical HR Abdomen: nondistended; ostomy present Msk:  Weak generally Extremities:   No edema.   Neuro: Hard of hearing Psych:  Normal affect, responds appropriately Skin: No rash   LABS: Basic Metabolic Panel:  Recent Labs  62/95/28 0540 09/19/14 0520  NA 135* 133*  K 4.4 4.2  CL 95* 93*  CO2 31 30  GLUCOSE 169* 138*  BUN 16 16  CREATININE 0.60 0.66  CALCIUM 8.3* 8.4  MG  --  2.2  PHOS  --  4.8*   Liver Function Tests:  Recent Labs  09/19/14 0520  AST 22  ALT 16  ALKPHOS 108  BILITOT 0.3  PROT 5.9*  ALBUMIN 1.4*   No results for input(s): LIPASE, AMYLASE in the last 72 hours. CBC:  Recent Labs  09/18/14 1400 09/19/14 0520  WBC 16.6* 15.6*  HGB 8.2* 8.7*  HCT 25.0* 26.4*  MCV 93.6 92.3  PLT 478* 558*   Cardiac Enzymes: No results for input(s): CKTOTAL, CKMB, CKMBINDEX, TROPONINI in the last 72 hours. BNP: Invalid input(s): POCBNP D-Dimer: No results for input(s): DDIMER in the last 72 hours. Hemoglobin A1C: No results for input(s): HGBA1C in the last 72 hours. Fasting Lipid Panel: No results for input(s): CHOL, HDL, LDLCALC, TRIG, CHOLHDL, LDLDIRECT in the last 72 hours. Thyroid Function Tests: No results for  input(s): TSH, T4TOTAL, T3FREE, THYROIDAB in the last 72 hours.  Invalid input(s): FREET3 Anemia Panel: No results for input(s): VITAMINB12, FOLATE, FERRITIN, TIBC, IRON, RETICCTPCT in the last 72 hours. Coag Panel:   Lab Results  Component Value Date   INR 1.11 09/16/2014   INR 1.26 09/13/2014   INR 1.08 Nov 05, 202015    RADIOLOGY: Dg Chest 1 View  09/12/2014   CLINICAL DATA:  PICC placement.  EXAM: CHEST - 1 VIEW 9:37 p.m.  COMPARISON:  09/12/2014 at 6:57 p.m.  FINDINGS: PICC tip is now  17 mm below the carina in good position.  Heart size and pulmonary vascularity are normal. Minimal atelectasis at the left lung base. Lungs are otherwise clear.  No acute osseous abnormality. Old deformity of the proximal right humerus.  IMPRESSION: PICC in good position.  Minimal atelectasis at the left lung base.   Electronically Signed   By: Geanie Cooley M.D.   On: 09/12/2014 22:01   Ct Abdomen Pelvis W Contrast  09/16/2014   ADDENDUM REPORT: 09/16/2014 16:23  ADDENDUM: I discussed the results of this study with Dr. Maisie Fus at approximately 1620 hours  on 09/16/2014.   Electronically Signed   By: Kennith CenterEric  Mansell M.D.   On: 09/16/2014 16:23   09/16/2014   CLINICAL DATA:  Subsequent encounter for longstanding ulcerative colitis with recent diagnosis is sigmoid stricture. Patient is status post total colectomy with end ileostomy on Aug 10, 202015. Rising white cell count.  EXAM: CT ABDOMEN AND PELVIS WITH CONTRAST  TECHNIQUE: Multidetector CT imaging of the abdomen and pelvis was performed using the standard protocol following bolus administration of intravenous contrast.  CONTRAST:  100mL OMNIPAQUE IOHEXOL 300 MG/ML  SOLN  COMPARISON:  09/04/2014  FINDINGS: Lower chest: Compressive atelectasis is noted in both lower lobes with small bilateral pleural effusions.  Hepatobiliary: No focal abnormality within the liver parenchyma. Gallbladder is distended without evidence for stones. No intrahepatic or extrahepatic biliary  dilation.  Pancreas: No focal mass lesion. No dilatation of the main duct. No intraparenchymal cyst. No peripancreatic edema.  Spleen: No splenomegaly. No focal mass lesion.  Adrenals/Urinary Tract: No adrenal nodule or mass. Bilateral renal cysts are again noted. No hydronephrosis or evidence of hydroureter. Foley catheter decompresses the urinary bladder. Gas in the bladder is compatible with the instrumentation.  Stomach/Bowel: Stomach is moderately distended. Duodenum is normal in appearance. No evidence for small bowel dilatation. No small bowel wall thickening. Gas is visible in the ileum and there is air in the ileal loop tracking out the stoma to the right abdominal and ileostomy. Patient is status post subtotal colectomy. Fluid filled Hartmann's pouch is evident.  Vascular/Lymphatic: Atherosclerotic calcification is noted in the wall of the abdominal aorta without aneurysm. No gastrohepatic or hepatoduodenal ligament lymphadenopathy. No retroperitoneal lymphadenopathy. No evidence for pelvic sidewall lymphadenopathy.  Reproductive: Prostate gland and seminal vesicles are unremarkable.  Other: 4.2 x 4.4 x 4.8 cm focal area of fluid and debris and gas is identified in the right mesentery, just lateral to the superior mesenteric vein. While the remaining areas of intraperitoneal free fluid in the abdomen appear to be more free flowing, this collection appears loculated although it does not have a well-defined or thick rim.  A left abdominal surgical drain tracks down into the pelvis were crosses the midline just caudal to the Hartmann's pouch with the and of the drain coursing along the right pelvic sidewall. There is small volume perihepatic ascites with a small amount of fluid in the right paracolic gutter. Diffuse areas of mesenteric edema are noted, compatible with recent surgery. There is a small amount of free fluid in the mesenteric loops of the pelvis and in some areas this fluid demonstrates subtle  peritoneal enhancement, suggesting associated peritoneal irritation.  The  A small amount of intraperitoneal free air is associated with air in the Right rectus sheath and in the subcutaneous of the right anterior abdominal wall.  Musculoskeletal: Bone windows reveal no worrisome lytic or sclerotic osseous lesions.  IMPRESSION: 4.2 x 4.4 x 4.8 cm focal collection of debris and gas in the right central mesentery, adjacent to the superior mesenteric vein. This particular location has a different appearance than any of the other fluid seen in the abdomen or pelvis and given the history of leukocytosis, raises concern for evolving abscess, although it does not have a well-defined or enhancing rim at this time.  Status post subtotal colectomy with right abdominal end ileostomy. No evidence for bowel obstruction. There is a small amount of ascites adjacent to the liver and in the right paracolic gutter and subtle peritoneal enhancement is seen along some of the fluid collections although  they do not appear to be well organized at this time.  A very small amount of intraperitoneal free air is associated with free air in the rectus sheath and anterior abdominal wall, not unexpected 6 days out from surgery.  Mild dependent atelectasis in the lower lobes bilaterally with small bilateral pleural effusions.  Electronically Signed: By: Kennith CenterEric  Mansell M.D. On: 09/16/2014 15:56   Ct Abdomen Pelvis W Contrast  09/04/2014   CLINICAL DATA:  Ulcerative colitis with intestinal obstruction K51.912 (ICD-10-CM). 73 year old male with past medical history of diabetes, coronary artery disease, history of atrial fibrillation, history of PE, history of syncope and ulcerative colitis present with exacerbation of ulcerative colitis and found to be in a-fibeval for abd pain and colitis  EXAM: CT ABDOMEN AND PELVIS WITH CONTRAST  TECHNIQUE: Multidetector CT imaging of the abdomen and pelvis was performed using the standard protocol following  bolus administration of intravenous contrast.  CONTRAST:  50mL OMNIPAQUE IOHEXOL 300 MG/ML SOLN, 100mL OMNIPAQUE IOHEXOL 300 MG/ML SOLN  COMPARISON:  Plain films 01/13/2009.  Most recent CT of 03/14/2007  FINDINGS: Lower chest: Clear lung bases. Mild cardiomegaly. Right coronary artery atherosclerosis. No pericardial or pleural effusion.  Hepatobiliary: Minimal exclusion of the hepatic dome. Mild hepatic steatosis suspected. No focal liver lesion. Focal steatosis adjacent the falciform ligament. Normal gallbladder, without biliary ductal dilatation.  Pancreas: Mild pancreatic atrophy. A tiny cystic focus in the pancreatic body on image 22 of series 2. Alternatively, this could represent interdigitation of peripancreatic fat. Appearance was likely present on the prior exam, suggesting a benign etiology.  Spleen: Normal  Adrenals/Urinary Tract: Normal adrenal glands. Bilateral renal cysts and too small to characterize lesions. No hydronephrosis. Normal ureters and urinary bladder.  Stomach/Bowel: Normal stomach, without wall thickening. Mild to moderate wall thickening involving the sigmoid and rectum. The descending colon is underdistended, without definite inflammation. There is also wall thickening involving the transverse colon. Hyperemia as evidenced by prominence of the Vasa recta. Ascending colon, terminal ileum, and appendix all normal. Normal small bowel without abdominal ascites.  Vascular/Lymphatic: Moderate aortic and branch vessel atherosclerosis. No retroperitoneal or retrocrural adenopathy. No pelvic adenopathy.  Reproductive:  Mild prostatomegaly.  Other: No significant free fluid. Fat containing left inguinal hernia.  Musculoskeletal: No acute osseous abnormality. 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  IMPRESSION: 1. Left-sided colitis, likely representing active ulcerative colitis. Infectious could look similar. Ischemia felt unlikely, given distribution  and clinical history. 2. Atherosclerosis, including within the coronary arteries. 3. Hepatic steatosis.   Electronically Signed   By: Jeronimo GreavesKyle  Talbot M.D.   On: 09/04/2014 17:06   Dg Chest Port 1 View  09/12/2014   CLINICAL DATA:  Confirm PICC line placement.  EXAM: PORTABLE CHEST - 1 VIEW  COMPARISON:  09/06/2014  FINDINGS: There is a right arm PICC line. The catheter tip appears to be in the right atrium. The catheter could be pulled back 6 cm for positioning in the lower SVC. Low lung volumes. No focal airspace disease. Heart size is stable.  IMPRESSION: PICC line tip in the right atrium.   Electronically Signed   By: Richarda OverlieAdam  Henn M.D.   On: 09/12/2014 19:15   Dg Chest Port 1 View  09/06/2014   CLINICAL DATA:  73 year old male status post PICC line placement. Initial encounter.  EXAM: PORTABLE CHEST - 1 VIEW  COMPARISON:  CT Abdomen and Pelvis 09/04/2014.  FINDINGS: Portable AP semi upright view at 1553 hrs. Right side PICC line placed, tip  projects just below the carina. Normal cardiac size and mediastinal contours. Visualized tracheal air column is within normal limits. No pneumothorax. Somewhat low lung volumes. Mild increased interstitial markings, stable. Otherwise Allowing for portable technique, the lungs are clear.  IMPRESSION: 1. Right PICC line placed, tip at the SVC level. 2.  No acute cardiopulmonary abnormality.   Electronically Signed   By: Augusto Gamble M.D.   On: 09/06/2014 16:21      ASSESSMENT: Roosvelt Harps:    1) AFib: rate controlled.  Hbg stable.  Would benefit from anticoagulation in order to prevent stroke.  In reviewing the initial consult note from Dr. Clifton James, it was noted that the patient has been off of anticoagulation for many years due to his ulcerative colitis.  Was on IV heparin, but this was stopped yesterday due to rectal bleeding.  Long term Bleeding risk is lower after the surgery but he still has UC disease in the rectum.  Would only think about Coumadin when surgical issues  clear.  May not be a candidate given recent bleeding.  Switch to oral metoprolol when tolerating pills.    Corky Crafts., MD  09/19/2014  7:49 AM

## 2014-09-19 NOTE — Progress Notes (Signed)
9 Days Post-Op  Subjective: He just looks tired.  A little more in the ostomy, more bowel sounds today.  Wound looks stable.  Objective: Vital signs in last 24 hours: Temp:  [97.8 F (36.6 C)-98.4 F (36.9 C)] 97.8 F (36.6 C) (11/12 0534) Pulse Rate:  [82-101] 92 (11/12 0534) Resp:  [14-26] 14 (11/12 1053) BP: (114-124)/(60-69) 114/60 mmHg (11/12 0534) SpO2:  [95 %-99 %] 98 % (11/12 1053) Last BM Date: 09/18/14 (via ileostomy)  Only 70 ml from ileostomy 20 ml from drains Afebrile, VSS WBC slow improvement Intake/Output from previous day: 11/11 0701 - 11/12 0700 In: 1639 [I.V.:467.3; NG/GT:30; TPN:1141.7] Out: 3755 [Urine:2915; Emesis/NG output:750; Drains:20; Stool:70] Intake/Output this shift: Total I/O In: -  Out: 700 [Urine:600; Emesis/NG output:100]  General appearance: alert, cooperative, no distress and HOH Resp: clear to auscultation bilaterally and Anterior GI: open wound less purulent today, looks a little better.  More bowel sounds today.  Lab Results:   Recent Labs  09/18/14 1400 09/19/14 0520  WBC 16.6* 15.6*  HGB 8.2* 8.7*  HCT 25.0* 26.4*  PLT 478* 558*    BMET  Recent Labs  09/17/14 0540 09/19/14 0520  NA 135* 133*  K 4.4 4.2  CL 95* 93*  CO2 31 30  GLUCOSE 169* 138*  BUN 16 16  CREATININE 0.60 0.66  CALCIUM 8.3* 8.4   PT/INR No results for input(s): LABPROT, INR in the last 72 hours.   Recent Labs Lab 09/16/14 0403 09/19/14 0520  AST 14 22  ALT 16 16  ALKPHOS 81 108  BILITOT 1.1 0.3  PROT 5.0* 5.9*  ALBUMIN 1.1* 1.4*     Lipase     Component Value Date/Time   LIPASE 22.0 03/03/2007 0941     Studies/Results: Dg Chest Port 1 View  09/19/2014   CLINICAL DATA:  Adjustment of central catheter  EXAM: PORTABLE CHEST - 1 VIEW  COMPARISON:  September 12, 2014  FINDINGS: Central catheter tip is in the midportion of the superior vena cava. No pneumothorax. Nasogastric tube tip and side port are below the diaphragm. No  pneumothorax. There is no appreciable edema or consolidation. Heart is upper normal in size with pulmonary vascularity within normal limits. No adenopathy. No bone lesions.  IMPRESSION: Tube and catheter positions as described without pneumothorax. No edema or consolidation.   Electronically Signed   By: Bretta BangWilliam  Woodruff M.D.   On: 09/19/2014 10:08    Medications: . acetaminophen  1,000 mg Oral TID  . becaplermin   Topical Q7 days  . citalopram  20 mg Oral Daily  . HYDROmorphone PCA 0.3 mg/mL   Intravenous 6 times per day  . insulin aspart  0-15 Units Subcutaneous 6 times per day  . lip balm  1 application Topical BID  . metoprolol  2.5 mg Intravenous 4 times per day  . pantoprazole (PROTONIX) IV  40 mg Intravenous Q24H  . piperacillin-tazobactam (ZOSYN)  IV  3.375 g Intravenous 3 times per day  . sodium chloride  10-40 mL Intracatheter Q12H   . sodium chloride 10 mL/hr at 09/13/14 0103  . Marland Kitchen.TPN (CLINIMIX-E) Adult 100 mL/hr at 09/18/14 1800   And  . fat emulsion Stopped (09/18/14 2000)  . TPN (CLINIMIX) Adult without lytes     And  . fat emulsion      Assessment/Plan 1. 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 Leone PayorGessner - 09/04/2014 [note that this stricture was documented in 2011 on a colonoscopy attempt. S/p Total abdominal  colectomy with end ileostomy, Dr. Harden MoMatt Wakefield, 09/10/14. Concern for stump leak, based on drainage from the JP/CT findings: 4.2 x 4.4 x 4.8 cm focal collection of debris and gas in the right central mesentery 2. Anemia - Hgb - 10/6 - 09/07/2014 transfused 09/14/14 3. Malnutrition - Prealbumin- 12.7 On TNA 4. DM 5. CAD 6. Chronic History of A. Fib rate in the 80's. 7. Very hard of hearing 8. DVT prophylaxis - on no chemoprophylaxis due to UC 9. Rectal bleeding on heparin last 11/10-11/15 (Heparin is still off)   Plan:  Continue current Rx, including antibiotics TNA.  We will need to repeat CT scan  early next week.  Await bowel function.  Appreciate Dr. Izola PriceMyers assistance    LOS: 17 days    Aydyn Testerman 09/19/2014

## 2014-09-19 NOTE — Progress Notes (Signed)
Patient ID: Ronald Mayo, male   DOB: 05/07/1941, 73 y.o.   MRN: [id number]  [facility] PROGRESS NOTE  Ronald QuarryWilliam M Basista FAO:[id number] DOB: 05/07/1941 DOA: 09/02/2014 PCP: Aniceto BossSETTLE,PAUL C, MD  Brief narrative: 73 year old male with ulcerative colitis on sulfasalazine, DM type II (on Amaryl), presented 10/26 with main concern of several weeks duration or persistent diarrhea and more recently blood in stool. This has been associated with poor oral intake, malaise, weight loss, subjective fevers, chills, intermittent abd cramping, non radiation, with no specific alleviating or aggravating factors. Ct abd and pelvis c/w active UC. 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 Dwain SarnaWakefield 11/03. Now with concern for post op stump leak, based on drainage from the JP/CT findings: 4.2 x 4.4 x 4.8 cm focal collection of debris and gas in the right central mesentery. Due to complex chronic medical conditions imposed on new acute complications noted below, [facility] Discussed with surgery team.   Assessment and Plan:    Impression/Recommendations Active Problems:  Ulcerative colitis - with sigmoid stricture - on colonoscopy by Dr. Leone PayorGessner - 09/04/2014  - pt is now s/p total abdominal colectomy with end ileostomy by Dr. Dwain SarnaWakefield 11/03 - concern of post op stump leak, based on drainage from the JP/CT findings: 4.2 x 4.4 x 4.8 cm focal collection of debris and gas in the right central mesentery - management per primary team - continue Zosyn day #4 - pt currently on TNA, keep NPO  - abdominal cultures still pending Acute on chronic blood loss anemia, UC with post op bleed - Hg trending down over the past 72 hours: 9.7 --> 9.2 --> 8.4 --> 8.2 --> 8.7 - close monitoring and repeat CBC in AM Atrial fib - rate controlled - may not be AC candidate due to now acute issues, per cardiology - appreciate assistance  - will plan to convert to PO Metoprolol once  pt able to take PO  Leukocytosis - secondary to the above, active problem - WBC trending down - continue Zosyn day #4, abd cultures pending  - CBC in AM DM type II - pt at home on Amaryl, on hold  - continue only SSI for now Protein-calorie malnutrition, severe  - secondary to progressive nature of UC and acute illness - NPO for now with TNA  Chronic venous insufficiency ulcerations, left medial malleolus, left heel. - Measurement: medial malleolar ulceration has healed. Left heel ulcer measures 0.5cm x 0.8cm, x 0.4cm - Wound ONG:EXBMWbed:clean, pink, moist, mild drainage serous  - recommend regranex applied following cleanse with NS, and pat dry. This is topped with a soft silicone foam dressing - appreciate wound care team assistance  Ostomy  - RUQ ileostomy, 1 and 5/8 inches round, red, slightly budded - appreciate wound care team help  Acute functional quadriplegia - OOB to chair if pt able to tolerate   DVT prophylaxis  SCD  Code Status: Full Family Communication: Pt at bedside Disposition Plan: Remains inpatient   IV Access:    Peripheral IV Procedures and diagnostic studies:    Ct Abdomen Pelvis W Contrast 09/16/2014 4.2 x 4.4 x 4.8 cm focal collection of debris and gas in the right central mesentery, adjacent to the superior mesenteric vein. This particular location has a different appearance than any of the other fluid seen in the abdomen or pelvis and given the history of leukocytosis, raises concern for evolving abscess, although it does not have a well-defined or enhancing rim at  this time. Status post subtotal colectomy with right abdominal end ileostomy. No evidence for bowel obstruction. There is a small amount of ascites adjacent to the liver and in the right paracolic gutter and subtle peritoneal enhancement is seen along some of the fluid collections although they do not appear to be well organized at this time. A very small amount of intraperitoneal free  air is associated with free air in the rectus sheath and anterior abdominal wall, not unexpected 6 days out from surgery.  Ct Abdomen Pelvis W Contrast 09/04/2014 Left-sided colitis, likely representing active ulcerative colitis. Infectious could look similar. Ischemia felt unlikely, given distribution and clinical history.Atherosclerosis, coronary arteries   Dg Chest Port 1 View 09/12/2014 PICC line tip in the right atrium.   Dg Chest Port 1 View 09/06/2014 Right PICC line placed, tip at the SVC level. No acute cardiopulmonary abnormality.  Medical Consultants:    Surgery  Cardiology   GI Other Consultants:    PT - recommend SNF upon discharge  OT - recommend SNF upon discharge  Wound care team  Anti-Infectives:    Zosyn 11/09 -->  Debbora PrestoMAGICK-Tattiana Fakhouri, MD  [facility] Pager [phone]  If 7PM-7AM, please contact night-coverage [email or web address] Password [email or web address] 09/19/2014, 11:52 AM   LOS: 17 days   HPI/Subjective: No events overnight.   Objective: Filed Vitals:   09/19/14 0534 09/19/14 0735 09/19/14 1039 09/19/14 1053  BP: 114/60     Pulse: 92     Temp: 97.8 F (36.6 C)     TempSrc: Oral     Resp: 19 14 14 14   Height:      Weight:      SpO2: 97% 98% 98% 98%    Intake/Output Summary (Last 24 hours) at 09/19/14 1152 Last data filed at 09/19/14 1000  Gross per 24 hour  Intake   1639 ml  Output   3920 ml  Net  -2281 ml    Exam:   General: Pt is alert, follows commands appropriately, not in acute distress, HOH   Cardiovascular: tachycardic, S1/S2, no murmurs, no rubs, no gallops  Respiratory: No wheezing, diminished breath sounds at bases   Abdomen: large abdomen, sore, open wound with purulent drainage and pockets of drainag   Extremities: No edema, pulses DP and PT palpable bilaterally  Data Reviewed: Basic Metabolic Panel:  Recent Labs Lab 09/13/14 0500 09/13/14 0610 09/14/14 1100 09/16/14 0403 09/17/14 0540 09/19/14 0520  NA 135*   --  134* 135* 135* 133*  K 3.8  --  4.2 4.5 4.4 4.2  CL 94*  --  94* 95* 95* 93*  CO2 32  --  32 32 31 30  GLUCOSE 150*  --  170* 146* 169* 138*  BUN 17  --  15 16 16 16   CREATININE 0.54  --  0.54 0.59 0.60 0.66  CALCIUM 8.4  --  8.2* 8.1* 8.3* 8.4  MG  --  2.0  --  2.0  --  2.2  PHOS  --  3.2  --  4.4  --  4.8*   Liver Function Tests:  Recent Labs Lab 09/16/14 0403 09/19/14 0520  AST 14 22  ALT 16 16  ALKPHOS 81 108  BILITOT 1.1 0.3  PROT 5.0* 5.9*  ALBUMIN 1.1* 1.4*   CBC:  Recent Labs Lab 09/16/14 0403 09/17/14 0540 09/18/14 0540 09/18/14 1400 09/19/14 0520  WBC 23.3* 18.6* 18.6* 16.6* 15.6*  NEUTROABS 18.8*  --   --   --   --  HGB 9.7* 9.2* 8.4* 8.2* 8.7*  HCT 28.2* 27.4* 25.2* 25.0* 26.4*  MCV 90.7 90.7 92.3 93.6 92.3  PLT 429* 473* 479* 478* 558*   CBG:  Recent Labs Lab 09/18/14 1602 09/18/14 1935 09/18/14 2323 09/19/14 0319 09/19/14 0718  GLUCAP 86 108* 122* 120* 134*    Recent Results (from the past 240 hour(s))  Surgical pcr screen     Status: None   Collection Time: 09/10/14  5:59 AM  Result Value Ref Range Status   MRSA, PCR NEGATIVE NEGATIVE Final   Staphylococcus aureus NEGATIVE NEGATIVE Final    Comment:        The Xpert SA Assay (FDA approved for NASAL specimens in patients over 83 years of age), is one component of a comprehensive surveillance program.  Test performance has been validated by [employer] for patients greater than or equal to 73 year old. It is not intended to diagnose infection nor to guide or monitor treatment.   Wound culture     Status: None (Preliminary result)   Collection Time: 09/18/14 10:14 AM  Result Value Ref Range Status   Specimen Description ABDOMEN  Final   Special Requests Immunocompromised  Final   Gram Stain PENDING  Incomplete   Culture   Final    Culture reincubated for better growth Performed at [facility]    Report Status PENDING  Incomplete     Scheduled Meds: .  acetaminophen  1,000 mg Oral TID  . becaplermin   Topical Q7 days  . citalopram  20 mg Oral Daily  . HYDROmorphone PCA 0.3 mg/mL   Intravenous 6 times per day  . insulin aspart  0-15 Units Subcutaneous 6 times per day  . lip balm  1 application Topical BID  . metoprolol  2.5 mg Intravenous 4 times per day  . pantoprazole (PROTONIX) IV  40 mg Intravenous Q24H  . piperacillin-tazobactam (ZOSYN)  IV  3.375 g Intravenous 3 times per day  . sodium chloride  10-40 mL Intracatheter Q12H   Continuous Infusions: . sodium chloride 10 mL/hr at 09/13/14 0103  . Marland KitchenTPN (CLINIMIX-E) Adult 100 mL/hr at 09/18/14 1800   And  . fat emulsion Stopped (09/18/14 2000)  . TPN (CLINIMIX) Adult without lytes     And  . fat emulsion

## 2014-09-19 NOTE — Progress Notes (Addendum)
PARENTERAL NUTRITION CONSULT NOTE   Pharmacy Consult for TPN Indication: Severe ulcerative colitis and malnutrition  No Known Allergies  Patient Measurements: Height: 6' (182.9 cm) Weight: 229 lb 8 oz (104.1 kg) IBW/kg (Calculated) : 77.6 Usual Weight: ~ 246lbs (30 lb wt loss over 3 months)  Vital Signs: Temp: 97.8 F (36.6 C) (11/12 0534) Temp Source: Oral (11/12 0534) BP: 114/60 mmHg (11/12 0534) Pulse Rate: 92 (11/12 0534) Intake/Output from previous day: 11/11 0701 - 11/12 0700 In: 1639 [I.V.:467.3; NG/GT:30; TPN:1141.7] Out: 3755 [Urine:2915; Emesis/NG output:750; Drains:20; Stool:70] Intake/Output from this shift:   Labs:  Recent Labs  09/16/14 1145  09/18/14 0540 09/18/14 1400 09/19/14 0520  WBC  --   < > 18.6* 16.6* 15.6*  HGB  --   < > 8.4* 8.2* 8.7*  HCT  --   < > 25.2* 25.0* 26.4*  PLT  --   < > 479* 478* 558*  APTT 38*  --   --   --   --   INR 1.11  --   --   --   --   < > = values in this interval not displayed.  Recent Labs  09/17/14 0540 09/19/14 0520  NA 135* 133*  K 4.4 4.2  CL 95* 93*  CO2 31 30  GLUCOSE 169* 138*  BUN 16 16  CREATININE 0.60 0.66  CALCIUM 8.3* 8.4  MG  --  2.2  PHOS  --  4.8*  PROT  --  5.9*  ALBUMIN  --  1.4*  AST  --  22  ALT  --  16  ALKPHOS  --  108  BILITOT  --  0.3   Estimated Creatinine Clearance: 104.1 mL/min (by C-G formula based on Cr of 0.66).    Recent Labs  09/18/14 2323 09/19/14 0319 09/19/14 0718  GLUCAP 122* 120* 134*   Medical History: Past Medical History  Diagnosis Date  . 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 Diabetes mellitus   . CAD (coronary artery disease)   . Personal history of other malignant neoplasm of skin   . Atrial fibrillation   . Stricture of sigmoid colon 09/04/2014   Insulin Requirements:  2 units/24 hr: moderate Novolog correction scale q4h  Current Nutrition:  NPO starting 11/3  IVF: NS at 5710ml/hr   Central access:  PICC placed 10/30 TPN start  date: 10/30  ASSESSMENT  HPI: [age] YOM with known h/o ulcerative colitis.  Colonoscopy reveals severe UC and proctitis with friable strictures.  Biopsies consistent with UC and not malignancy. General surgery consulted and plan is for proctocolectomy and J-pouch.  Pharmacy asked to start TPN to improve nutritional status for surgery. History of diabetes on glimepiride prior to admission, glimepiride held, using Novolog correction scale  Significant events:  10/30: TPN started at 40 ml/hr 11/1: advanced TPN to 60 m/hr, removed regular insulin from TPN and added Lantus BID 11/3: total colectomy with ileostomy 11/5: TPN at goal of 15700ml/hr, lantus dose decreased and regular insulin added to TPN 11/11: Lantus discontinued  Today:   Glucose - CBGs at goal < 150. CBGs 108-134.  Electrolytes - Na = 133 (low), Phos = 4.8 (high), Cl = 93 (low), Corrected Ca = 10.48 (ULN), K = 4.2 (WNL), Mag = 2.2 (WNL)  Renal - SCr WNL  LFTs - WNL  TGs - 96 (11/2), 144 (11/9)  Prealbumin - 12.7 (10/30), 10.3 (11/2), 5 (11/9)  NUTRITIONAL GOALS RD recs: 2200-2400 kcals, 115-130gm protein Clinimix  5/15 at a goal rate of 1500ml/hr + 20% fat emulsion at 5810ml/hr to provide: 120g/day protein, 2184Kcal/day.  PLAN  At 1800 today:  Change to Clinimix 5/15 at goal rate of 17000ml/hr.  Changing to formulation without electrolytes due to elevated Phos this AM.  Continue with regular insulin 75 units in 3L TPN bag (25 units/L) - to deliver 60 units regular insulin/24h.  20% fat emulsion at 5110ml/hr.  TPN to contain standard multivitamins and trace elements.  [facility]  Continue moderate SSI/CBGs q4h.  TPN lab panels on Mondays & Thursdays.  BMET, Mag, Phos in AM.  F/u daily.   Greer PickerelJigna Byanka Landrus, PharmD, BCPS Pager: [phone] 09/19/2014 9:56 AM

## 2014-09-19 NOTE — Progress Notes (Addendum)
Occupational Therapy Treatment Patient Details Name: Ronald Mayo MRN: [id number] DOB: [birth year] Today's Date: 09/19/2014    History of present illness 73 y.o. male with h/o ulcerative colitis, DM, CAD, a fib, chronic diarrhea, chronic LLE wound admitted with bloody stools, abdominal pain and significant weight loss. Dx of colon stricture. 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    OT comments  Pt tolerated session well.  Increasing activity tolerance & happy to have shampoo cap today  Follow Up Recommendations    SNF   Equipment Recommendations    none by OT   Recommendations for Other Services      Precautions / Restrictions Precautions Precautions: Fall Precaution Comments: chronic wound L heel, pt wears compression stocking. Multiple lines/leads, L abdominal drain. NG tube       Mobility Bed Mobility         Supine to sit: Min assist Sit to supine: +2 for safety/equipment;Mod assist   General bed mobility comments: assist for lines  Transfers   Equipment used: Rolling walker (2 wheeled) Transfers: Sit to/from Stand Sit to Stand: Min assist;Mod assist         General transfer comment: min A from bed; mod A from recliner, due to lower surface    Balance                                   ADL       Grooming: Wash/dry hands;Brushing hair                                 General ADL Comments: transferred to recliner and washed hair with shampoo cap.  Pt sat up for 20 minutes      Vision                     Perception     Praxis      Cognition   Behavior During Therapy: [facility]FL for tasks assessed/performed Overall Cognitive Status: Within Functional Limits for tasks assessed                       Extremity/Trunk Assessment               Exercises     Shoulder Instructions       General Comments      Pertinent Vitals/ Pain       Pain Score: 7  (to 3 with PCA) Pain Location:  abdomen when he first sat in chair; decreased to 4 Pain Descriptors / Indicators: Sore Pain Intervention(s): Limited activity within patient's tolerance;PCA encouraged;Monitored during session;Repositioned  Home Living                                          Prior Functioning/Environment              Frequency Min 2X/week     Progress Toward Goals  OT Goals(current goals can now be found in the care plan section)  Progress towards OT goals: Progressing toward goals     Plan      Co-evaluation                 End of Session     Activity Tolerance Patient  tolerated treatment well   Patient Left in bed;with call bell/phone within reach;with bed alarm set   Nurse Communication          Time: [id number] OT Time Calculation (min): 32 min  Charges: OT General Charges $OT Visit: 1 Procedure OT Treatments $Self Care/Home Management : 8-22 mins $Therapeutic Activity: 8-22 mins  Kenlyn Lose 09/19/2014, 4:46 PM  Marica OtterMaryellen Addalie Calles, OTR/L [phone] 09/19/2014

## 2014-09-20 ENCOUNTER — Encounter (HOSPITAL_COMMUNITY): Payer: Self-pay | Admitting: General Surgery

## 2014-09-20 LAB — GLUCOSE, CAPILLARY
Glucose-Capillary: 100 mg/dL — ABNORMAL HIGH (ref 70–99)
Glucose-Capillary: 116 mg/dL — ABNORMAL HIGH (ref 70–99)
Glucose-Capillary: 124 mg/dL — ABNORMAL HIGH (ref 70–99)
Glucose-Capillary: 138 mg/dL — ABNORMAL HIGH (ref 70–99)
Glucose-Capillary: 141 mg/dL — ABNORMAL HIGH (ref 70–99)
Glucose-Capillary: 188 mg/dL — ABNORMAL HIGH (ref 70–99)

## 2014-09-20 LAB — CBC
HEMATOCRIT: 27.5 % — AB (ref 39.0–52.0)
HEMOGLOBIN: 8.8 g/dL — AB (ref 13.0–17.0)
MCH: 30.2 pg (ref 26.0–34.0)
MCHC: 32 g/dL (ref 30.0–36.0)
MCV: 94.5 fL (ref 78.0–100.0)
Platelets: 598 10*3/uL — ABNORMAL HIGH (ref 150–400)
RBC: 2.91 MIL/uL — ABNORMAL LOW (ref 4.22–5.81)
RDW: 14.3 % (ref 11.5–15.5)
WBC: 16.9 10*3/uL — ABNORMAL HIGH (ref 4.0–10.5)

## 2014-09-20 LAB — BASIC METABOLIC PANEL
Anion gap: 10 (ref 5–15)
BUN: 18 mg/dL (ref 6–23)
CALCIUM: 8.5 mg/dL (ref 8.4–10.5)
CO2: 30 mEq/L (ref 19–32)
Chloride: 94 mEq/L — ABNORMAL LOW (ref 96–112)
Creatinine, Ser: 0.69 mg/dL (ref 0.50–1.35)
GFR calc Af Amer: >90 mL/min (ref >90)
Glucose, Bld: 127 mg/dL — ABNORMAL HIGH (ref 70–99)
POTASSIUM: 4.3 meq/L (ref 3.7–5.3)
Sodium: 134 mEq/L — ABNORMAL LOW (ref 137–147)

## 2014-09-20 LAB — PHOSPHORUS: PHOSPHORUS: 3.9 mg/dL (ref 2.3–4.6)

## 2014-09-20 LAB — WOUND CULTURE

## 2014-09-20 LAB — MAGNESIUM: Magnesium: 2.1 mg/dL (ref 1.5–2.5)

## 2014-09-20 MED ORDER — TRACE MINERALS CR-CU-F-FE-I-MN-MO-SE-ZN IV SOLN
INTRAVENOUS | Status: AC
  Administered 2014-09-20: 18:00:00 via INTRAVENOUS
  Filled 2014-09-20: qty 3000

## 2014-09-20 MED ORDER — ACETAMINOPHEN 500 MG PO TABS
1000.0000 mg | ORAL_TABLET | Freq: Three times a day (TID) | ORAL | Status: DC
  Administered 2014-09-21 – 2014-09-22 (×2): 1000 mg via ORAL
  Filled 2014-09-20 (×5): qty 2

## 2014-09-20 MED ORDER — ACETAMINOPHEN 10 MG/ML IV SOLN
1000.0000 mg | Freq: Four times a day (QID) | INTRAVENOUS | Status: AC
  Administered 2014-09-20 – 2014-09-21 (×4): 1000 mg via INTRAVENOUS
  Filled 2014-09-20 (×4): qty 100

## 2014-09-20 MED ORDER — FAT EMULSION 20 % IV EMUL
250.0000 mL | INTRAVENOUS | Status: AC
  Administered 2014-09-20: 250 mL via INTRAVENOUS
  Filled 2014-09-20: qty 250

## 2014-09-20 NOTE — Progress Notes (Signed)
10 Days Post-Op  Subjective: He was complaining of pain when I came in and all the tubes holding him down. No nausea.  700 from NG, not much from his ostomy.  Wound is still tunneling and purulent.    Objective: Vital signs in last 24 hours: Temp:  [97.5 F (36.4 C)-98.7 F (37.1 C)] 98.2 F (36.8 C) (11/13 0842) Pulse Rate:  [66-94] 94 (11/13 0842) Resp:  [12-20] 18 (11/13 0842) BP: (112-139)/(42-73) 124/66 mmHg (11/13 0842) SpO2:  [94 %-100 %] 99 % (11/13 0842) Last BM Date: 09/18/14 (via ileostomy) 700 from the NG, 100 from the ileostomy recorded. NPO/TNA Afebrile, VSS Intake/Output from previous day: 11/12 0701 - 11/13 0700 In: 2854.3 [I.V.:252.1; IV Piggyback:400; TPN:2202.2] Out: 3255 [Urine:2450; Emesis/NG output:700; Drains:5; Stool:100] Intake/Output this shift:    General appearance: alert, cooperative and no distress Resp: clear to auscultation bilaterally and anterior GI: Soft, + BS, ostomy working but not much in it. wound is still tunneling.  lower tunnel 6 cm deep.    Lab Results:   Recent Labs  09/19/14 0520 09/20/14 0443  WBC 15.6* 16.9*  HGB 8.7* 8.8*  HCT 26.4* 27.5*  PLT 558* 598*    BMET  Recent Labs  09/19/14 0520 09/20/14 0443  NA 133* 134*  K 4.2 4.3  CL 93* 94*  CO2 30 30  GLUCOSE 138* 127*  BUN 16 18  CREATININE 0.66 0.69  CALCIUM 8.4 8.5   PT/INR No results for input(s): LABPROT, INR in the last 72 hours.   Recent Labs Lab 09/16/14 0403 09/19/14 0520  AST 14 22  ALT 16 16  ALKPHOS 81 108  BILITOT 1.1 0.3  PROT 5.0* 5.9*  ALBUMIN 1.1* 1.4*     Lipase     Component Value Date/Time   LIPASE 22.0 03/03/2007 0941     Studies/Results: Dg Chest Port 1 View  09/19/2014   CLINICAL DATA:  Adjustment of central catheter  EXAM: PORTABLE CHEST - 1 VIEW  COMPARISON:  September 12, 2014  FINDINGS: Central catheter tip is in the midportion of the superior vena cava. No pneumothorax. Nasogastric tube tip and side port are below  the diaphragm. No pneumothorax. There is no appreciable edema or consolidation. Heart is upper normal in size with pulmonary vascularity within normal limits. No adenopathy. No bone lesions.  IMPRESSION: Tube and catheter positions as described without pneumothorax. No edema or consolidation.   Electronically Signed   By: Bretta BangWilliam  Woodruff M.D.   On: 09/19/2014 10:08    Medications: . acetaminophen  1,000 mg Oral TID  . becaplermin   Topical Q7 days  . citalopram  20 mg Oral Daily  . HYDROmorphone PCA 0.3 mg/mL   Intravenous 6 times per day  . insulin aspart  0-15 Units Subcutaneous 6 times per day  . lip balm  1 application Topical BID  . metoprolol  2.5 mg Intravenous 4 times per day  . pantoprazole (PROTONIX) IV  40 mg Intravenous Q24H  . piperacillin-tazobactam (ZOSYN)  IV  3.375 g Intravenous 3 times per day  . sodium chloride  10-40 mL Intracatheter Q12H    Assessment/Plan 1. 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 Leone PayorGessner - 09/04/2014 [note that this stricture was documented in 2011 on a colonoscopy attempt. S/p Total abdominal colectomy with end ileostomy, Dr. Harden MoMatt Wakefield, 09/10/14. Concern for stump leak, based on drainage from the JP/CT findings: 4.2 x 4.4 x 4.8 cm focal collection of debris and gas in the right  central mesentery 2. Anemia - Hgb - 10/6 - 09/07/2014 transfused 09/14/14 3. Malnutrition - Prealbumin- 12.7 On TNA 4. DM 5. CAD 6. Chronic History of A. Fib rate in the 80's. 7. Very hard of hearing 8. DVT prophylaxis - on no chemoprophylaxis due to UC 9. Rectal bleeding on heparin last 11/10-11/15 (Heparin is still off)  Plan:   I am going to try some clamping trials with the NG and see how he does.  Add iv tylenol to his meds and see if we can back off on his dilaudid.  The Epic will only let me do this for 24 hours, so it will have to be renewed over the weekend.  I have shown the nursing staff how to  irrigate open wound and I am going to try some iodoform packing for now and see if this helps.  Continue IV antibiotics, and TNA.  It would be nice to get the NG out and start some PO.  Holding heparin for now because of the bleeding. We did the last CT on 11/9, we may want to repeat Ct early next week.    LOS: 18 days    Nashon Erbes 09/20/2014

## 2014-09-20 NOTE — Consult Note (Signed)
WOC ostomy follow up Stoma type/location: RUQ ileostomy Stomal assessment 1 and 5/8 inches, today at rest the stoma is slightly oval.  When mild traction is applied to the 12 o'clock position, stoma is round.  Moist, slightly budded and red in appearance. Peristomal assessment: intact, clear Treatment options for stomal/peristomal skin: Skin barrier ring Output dark green/brown effluent Ostomy pouching: 1pc.with skin barrier ring.  I used flat pouching system again today. Education provided: Patient required frequent re-education regarding Lock and Roll closure.  I am not sure at this time if he will be independent in his ostomy care-evening emptying. Suppleis at bedside in the event a pouch change is required before Monday. [place] [facility]: No WOC nursing team will not follow, but will remain available to this patient, the nursing and medical team.  Please re-consult if needed. Thanks, Ladona MowLaurie Siearra Amberg, MSN, RN, GNP, GamalielWOCN, CWON-AP [phone])

## 2014-09-20 NOTE — Progress Notes (Signed)
Patient ID: Ronald Mayo, male   DOB: 1940/12/01, 73 y.o.   MRN: [id number]  [facility] PROGRESS NOTE  Ronald Mayo [id number] DOB: 1941/09/19 DOA: 09/02/2014 PCP: Aniceto Boss, MD   Brief narrative: 73 year old male with ulcerative colitis on sulfasalazine, DM type II (on Amaryl), presented 10/26 with main concern of several weeks duration or persistent diarrhea and more recently blood in stool. This has been associated with poor oral intake, malaise, weight loss, subjective fevers, chills, intermittent abd cramping, non radiation, with no specific alleviating or aggravating factors. Ct abd and pelvis c/w active UC. 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 Dwain Sarna 11/03. Now with concern for post op stump leak, based on drainage from the JP/CT findings: 4.2 x 4.4 x 4.8 cm focal collection of debris and gas in the right central mesentery. Due to complex chronic medical conditions imposed on new acute complications noted below, will change attending team to [facility]. Discussed with surgery team.   Assessment and Plan:    Impression/Recommendations Active Problems:  Ulcerative colitis - with sigmoid stricture - on colonoscopy by Dr. Leone Payor - 09/04/2014  - pt is now s/p total abdominal colectomy with end ileostomy by Dr. Dwain Sarna 11/03 - concern of post op stump leak, based on drainage from the JP/CT findings: 4.2 x 4.4 x 4.8 cm focal collection of debris and gas in the right central mesentery - management per primary team - continue Zosyn day #5 for mesenteric abscess  - appreciate surgery assistance  Acute on chronic blood loss anemia, UC with post op bleed - Hg trending down over the past 72 hours: 9.7 --> 9.2 --> 8.4 --> 8.2 --> 8.7 --> 8.8 - close monitoring and repeat CBC in AM Atrial fib - rate controlled - may not be AC candidate due to now acute issues, per cardiology - appreciate assistance of cardiologist  - will plan to convert to PO  Metoprolol once pt able to take PO  Leukocytosis - secondary to the above, active problem - WBC trending up but pt remains afebrile  - continue Zosyn day #5, abd cultures pending  - CBC in AM DM type II - pt at home on Amaryl, on hold  - continue only SSI for now Protein-calorie malnutrition, severe  - secondary to progressive nature of UC and acute illness  - clamping trials today  Chronic venous insufficiency ulcerations, left medial malleolus, left heel. - Measurement: medial malleolar ulceration has healed. Left heel ulcer measures 0.5cm x 0.8cm, x 0.4cm - Wound VHQ:IONGE, pink, moist, mild drainage serous  - recommend regranex applied following cleanse with NS, and pat dry. This is topped with a soft silicone foam dressing - appreciate wound care team assistance  Ostomy  - RUQ ileostomy, 1 and 5/8 inches round, red, slightly budded - appreciate wound care team help  Acute functional quadriplegia - OOB to chair if pt able to tolerate   DVT prophylaxis  SCD  Code Status: Full Family Communication: Pt at bedside Disposition Plan: Remains inpatient   IV Access:    Peripheral IV Procedures and diagnostic studies:    Ct Abdomen Pelvis W Contrast 09/16/2014 4.2 x 4.4 x 4.8 cm focal collection of debris and gas in the right central mesentery, adjacent to the superior mesenteric vein. This particular location has a different appearance than any of the other fluid seen in the abdomen or pelvis and given the history of leukocytosis, raises concern for evolving abscess, although it does not have a  well-defined or enhancing rim at this time. Status post subtotal colectomy with right abdominal end ileostomy. No evidence for bowel obstruction. There is a small amount of ascites adjacent to the liver and in the right paracolic gutter and subtle peritoneal enhancement is seen along some of the fluid collections although they do not appear to be well organized at this time.  A very small amount of intraperitoneal free air is associated with free air in the rectus sheath and anterior abdominal wall, not unexpected 6 days out from surgery.  Ct Abdomen Pelvis W Contrast 09/04/2014 Left-sided colitis, likely representing active ulcerative colitis. Infectious could look similar. Ischemia felt unlikely, given distribution and clinical history.Atherosclerosis, coronary arteries   Dg Chest Port 1 View 09/12/2014 PICC line tip in the right atrium.   Dg Chest Port 1 View 09/06/2014 Right PICC line placed, tip at the SVC level. No acute cardiopulmonary abnormality.  Medical Consultants:    Surgery  Cardiology   GI Other Consultants:    PT - recommend SNF upon discharge  OT - recommend SNF upon discharge  Wound care team  Anti-Infectives:    Zosyn 11/09 -->  Debbora PrestoMAGICK-Demian Maisel, MD  [facility] Pager [phone]  If 7PM-7AM, please contact night-coverage [email or web address] Password [email or web address] 09/20/2014, 1:27 PM   LOS: 18 days   HPI/Subjective: No events overnight.   Objective: Filed Vitals:   09/20/14 0431 09/20/14 0521 09/20/14 0842 09/20/14 1152  BP:  112/73 124/66   Pulse:  85 94   Temp:  97.5 F (36.4 C) 98.2 F (36.8 C)   TempSrc:  Oral Oral   Resp: 15 16 18 17   Height:      Weight:      SpO2: 98% 98% 99% 95%    Intake/Output Summary (Last 24 hours) at 09/20/14 1327 Last data filed at 09/20/14 1211  Gross per 24 hour  Intake 3358.81 ml  Output   2607 ml  Net 751.81 ml    Exam:   General:  Pt is alert, follows commands appropriately, not in acute distress  Cardiovascular: Regular rate and rhythm, no rubs, no gallops  Respiratory: Clear to auscultation bilaterally, no wheezing, no crackles, no rhonchi  Abdomen: Soft, non distended, bowel sounds present, no guarding, wound is still tunneling   Data Reviewed: Basic Metabolic Panel:  Recent Labs Lab 09/14/14 1100 09/16/14 0403 09/17/14 0540 09/19/14 0520 09/20/14 0443   NA 134* 135* 135* 133* 134*  K 4.2 4.5 4.4 4.2 4.3  CL 94* 95* 95* 93* 94*  CO2 32 32 31 30 30   GLUCOSE 170* 146* 169* 138* 127*  BUN 15 16 16 16 18   CREATININE 0.54 0.59 0.60 0.66 0.69  CALCIUM 8.2* 8.1* 8.3* 8.4 8.5  MG  --  2.0  --  2.2 2.1  PHOS  --  4.4  --  4.8* 3.9   Liver Function Tests:  Recent Labs Lab 09/16/14 0403 09/19/14 0520  AST 14 22  ALT 16 16  ALKPHOS 81 108  BILITOT 1.1 0.3  PROT 5.0* 5.9*  ALBUMIN 1.1* 1.4*   CBC:  Recent Labs Lab 09/16/14 0403 09/17/14 0540 09/18/14 0540 09/18/14 1400 09/19/14 0520 09/20/14 0443  WBC 23.3* 18.6* 18.6* 16.6* 15.6* 16.9*  NEUTROABS 18.8*  --   --   --   --   --   HGB 9.7* 9.2* 8.4* 8.2* 8.7* 8.8*  HCT 28.2* 27.4* 25.2* 25.0* 26.4* 27.5*  MCV 90.7 90.7 92.3 93.6 92.3 94.5  PLT 429* 473* 479* 478* 558*  598*   CBG:  Recent Labs Lab 09/19/14 1915 09/19/14 2335 09/20/14 0328 09/20/14 0800 09/20/14 1158  GLUCAP 128* 133* 116* 141* 138*    Recent Results (from the past 240 hour(s))  Wound culture     Status: None   Collection Time: 09/18/14 10:14 AM  Result Value Ref Range Status   Specimen Description ABDOMEN  Final   Special Requests Immunocompromised  Final   Gram Stain   Final    ABUNDANT WBC PRESENT,BOTH PMN AND MONONUCLEAR NO SQUAMOUS EPITHELIAL CELLS SEEN FEW GRAM POSITIVE COCCI IN PAIRS Performed at [facility]    Culture   Final    MULTIPLE ORGANISMS PRESENT, NONE PREDOMINANT Note: NO STAPHYLOCOCCUS AUREUS ISOLATED NO GROUP A STREP (S.PYOGENES) ISOLATED Performed at [facility]    Report Status 09/20/2014 FINAL  Final     Scheduled Meds: . acetaminophen  1,000 mg Intravenous 4 times per day  . becaplermin   Topical Q7 days  . citalopram  20 mg Oral Daily  . insulin aspart  0-15 Units Subcutaneous 6 times per day  . metoprolol  2.5 mg Intravenous 4 times per day  . pantoprazole  IV  40 mg Intravenous Q24H  . ZOSYN  IV  3.375 g Intravenous 3 times per day    Continuous Infusions: . sodium chloride 10 mL/hr at 09/13/14 0103  . TPN (CLINIMIX) Adult without lytes 100 mL/hr at 09/19/14 1757   And  . fat emulsion 250 mL (09/19/14 1757)  . Marland Kitchen.TPN (CLINIMIX-E) Adult     And  . fat emulsion

## 2014-09-20 NOTE — Progress Notes (Signed)
NUTRITION FOLLOW UP  Intervention:   -TPN per pharmacy; currently at goal rate, monitor CBG's -Diet advancement per MD; will monitor supplement and diet education needs -RD to continue to follow  Nutrition Dx:   Inadequate oral intake related to nausea/abd pain as evidenced by PO intake < 75% for 2 months, 30 lb weight loss in 3 months; ongoing  Goal:   Pt to meet >/= 90% of their estimated nutrition needs; being met   Monitor:   TPN tolerance, diet order, labs, weights, GI profile  Assessment:   11/13:  Patient remains NPO. Patient asleep at time of visit with NG tube clamped. He continues to receive TPN at goal rate: Clinimix 5/15 at goal rate of 100 ml/hr with 20% lipids at 10 mlhr, providing 2184 kcal (100% est kcal needs), and 120 gram protein (100% est protein needs). His weight has trended up some with a 5 lbs weight gain in the past 2 weeks. Per MD note, may be able to remove NG tube later today.  Labs: glucose ranging 116 to 141 mg/dL, low hemoglobin, low sodium  11/06: -Clinimix 5/15 at goal rate of 100 ml/hr with 20% lipids at 10 mlhr, providing 2184 kcal (100% est kcal needs), and 120 gram protein (100% est protein needs) -Discussed pt with pharm on 11/05 re insulin modification and blood glucose control. RD in agreement with PharmD that pt would likely benefit from regular insulin being added in TPN with reduction in lantus dose -CBG currently at goal, < 150 mg/dL -Surgery recommended to continue NPO status; pt with some abd pain -WOC eval on 11/05 for venous stasis ulcers on heel and left lower leg, and provided ostomy care  Height: Ht Readings from Last 1 Encounters:  09/10/14 6' (1.829 m)    Weight Status:   Wt Readings from Last 1 Encounters:  09/17/14 229 lb 8 oz (104.1 kg)  09/10/14 224 lb 09/02/14 216 lb  Re-estimated needs:  Kcal: 2200-2400  Protein: 115-130 gram  Fluid: >/= 2200 ml daily    Skin:+1 LLE edema; wound on left heel; closed incision on  abdomen  Diet Order: Diet NPO time specified Except for: Ice Chips, Sips with Meds TPN (CLINIMIX) Adult without lytes TPN (CLINIMIX-E) Adult    Intake/Output Summary (Last 24 hours) at 09/20/14 1549 Last data filed at 09/20/14 1400  Gross per 24 hour  Intake 2858.84 ml  Output   3102 ml  Net -243.16 ml    Last BM: 11/11 via ileostomy  Labs:   Recent Labs Lab 09/16/14 0403 09/17/14 0540 09/19/14 0520 09/20/14 0443  NA 135* 135* 133* 134*  K 4.5 4.4 4.2 4.3  CL 95* 95* 93* 94*  CO2 32 '31 30 30  ' BUN '16 16 16 18  ' CREATININE 0.59 0.60 0.66 0.69  CALCIUM 8.1* 8.3* 8.4 8.5  MG 2.0  --  2.2 2.1  PHOS 4.4  --  4.8* 3.9  GLUCOSE 146* 169* 138* 127*    CBG (last 3)   Recent Labs  09/20/14 0328 09/20/14 0800 09/20/14 1158  GLUCAP 116* 141* 138*    Scheduled Meds: . acetaminophen  1,000 mg Intravenous 4 times per day  . [START ON 09/21/2014] acetaminophen  1,000 mg Oral TID  . becaplermin   Topical Q7 days  . citalopram  20 mg Oral Daily  . HYDROmorphone PCA 0.3 mg/mL   Intravenous 6 times per day  . insulin aspart  0-15 Units Subcutaneous 6 times per day  . lip balm  1 application Topical BID  . metoprolol  2.5 mg Intravenous 4 times per day  . pantoprazole (PROTONIX) IV  40 mg Intravenous Q24H  . piperacillin-tazobactam (ZOSYN)  IV  3.375 g Intravenous 3 times per day  . sodium chloride  10-40 mL Intracatheter Q12H    Continuous Infusions: . sodium chloride 10 mL/hr at 09/13/14 0103  . TPN (CLINIMIX) Adult without lytes 100 mL/hr at 09/19/14 1757   And  . fat emulsion 250 mL (09/19/14 1757)  . Marland KitchenTPN (CLINIMIX-E) Adult     And  . fat emulsion      Pryor Ochoa RD, LDN Inpatient Clinical Dietitian Pager: [phone] After Hours Pager: [phone]

## 2014-09-20 NOTE — Progress Notes (Signed)
PARENTERAL NUTRITION CONSULT NOTE - Follow Up  Pharmacy Consult for TPN Indication: Severe ulcerative colitis and malnutrition  No Known Allergies  Patient Measurements: Height: 6' (182.9 cm) Weight: 229 lb 8 oz (104.1 kg) IBW/kg (Calculated) : 77.6 Usual Weight: ~ 246lbs (30 lb wt loss over 3 months)  Vital Signs: Temp: 97.5 F (36.4 C) (11/13 0521) Temp Source: Oral (11/13 0521) BP: 112/73 mmHg (11/13 0521) Pulse Rate: 85 (11/13 0521) Intake/Output from previous day: 11/12 0701 - 11/13 0700 In: 2854.3 [I.V.:252.1; IV Piggyback:400; TPN:2202.2] Out: 3255 [Urine:2450; Emesis/NG output:700; Drains:5; Stool:100] Intake/Output from this shift:   Labs:  Recent Labs  09/18/14 1400 09/19/14 0520 09/20/14 0443  WBC 16.6* 15.6* 16.9*  HGB 8.2* 8.7* 8.8*  HCT 25.0* 26.4* 27.5*  PLT 478* 558* 598*    Recent Labs  09/19/14 0520 09/20/14 0443  NA 133* 134*  K 4.2 4.3  CL 93* 94*  CO2 30 30  GLUCOSE 138* 127*  BUN 16 18  CREATININE 0.66 0.69  CALCIUM 8.4 8.5  MG 2.2 2.1  PHOS 4.8* 3.9  PROT 5.9*  --   ALBUMIN 1.4*  --   AST 22  --   ALT 16  --   ALKPHOS 108  --   BILITOT 0.3  --    Estimated Creatinine Clearance: 104.1 mL/min (by C-G formula based on Cr of 0.69).    Recent Labs  09/19/14 2335 09/20/14 0328 09/20/14 0800  GLUCAP 133* 116* 141*   Insulin Requirements:  8 units/24 hr: moderate Novolog correction scale q4h  Current Nutrition:  NPO starting 11/3  IVF: NS at 3910ml/hr   Central access:  PICC placed 10/30 TPN start date: 10/30  ASSESSMENT  HPI: [id number] YOM with known h/o ulcerative colitis.  Colonoscopy reveals severe UC and proctitis with friable strictures.  Biopsies consistent with UC and not malignancy. General surgery consulted and plan is for proctocolectomy and J-pouch.  Pharmacy asked to start TPN to improve nutritional status for surgery. History of diabetes on glimepiride prior to admission, glimepiride held, using Novolog correction  scale  Significant events:  10/30: TPN started at 40 ml/hr 11/1: advanced TPN to 60 m/hr, removed regular insulin from TPN and added Lantus BID 11/3: total colectomy with ileostomy 11/5: TPN at goal of 16500ml/hr, lantus dose decreased and regular insulin added to TPN 11/11: Lantus discontinued 11/12: Electrolytes removed from TNA due to elevated Phos  Today, 11/13:   Glucose - CBGs at goal < 150.  Electrolytes - Na slightly low, Phos slightly elevated 11/12, now WNL today, Cl low (unable to adjust lytes in premixed TPN formulation).  Renal - SCr WNL  LFTs - WNL  TGs - 96 (11/2), 144 (11/9)  Prealbumin - 12.7 (10/30), 10.3 (11/2), 5 (11/9)  NUTRITIONAL GOALS RD recs: 2200-2400 kcals, 115-130gm protein Clinimix 5/15 at a goal rate of 13300ml/hr + 20% fat emulsion at 8110ml/hr to provide: 120g/day protein, 2184Kcal/day.  PLAN  At 1800 today:  Change to Clinimix E 5/15 at goal rate of 15900ml/hr.  Adding back electrolytes as labs have normalized.  Continue with regular insulin 75 units in 3L TPN bag (25 units/L) - to deliver 60 units regular insulin/24h.  20% fat emulsion at 5210ml/hr.  TPN to contain standard multivitamins and trace elements.  [facility]  Continue moderate SSI/CBGs q4h.  TPN lab panels on Mondays & Thursdays.  BMET, Mag, Phos in AM.  F/u daily.   Loralee PacasErin Maricruz Lucero, PharmD, BCPS Pager: [phone] 09/19/2014 9:56 AM

## 2014-09-20 NOTE — Progress Notes (Signed)
CSW assisting with d/c planning. [facility]. Updated clinicals have been received. SNF will consider pt for admission once NG tube and TPN are d/c. CSW will provide SNF with additional updates closer to the time of discharge. Pt remains medically unstable for d/c.   Cori RazorJamie Jazarah Capili LCSW [phone]

## 2014-09-20 NOTE — Progress Notes (Signed)
Physical Therapy Treatment Patient Details Name: Ronald Mayo MRN: [id number] DOB: 09-14-1941 Today's Date: 09/20/2014    History of Present Illness [age] y.o. male with h/o ulcerative colitis, DM, CAD, a fib, chronic diarrhea, chronic LLE wound admitted with bloody stools, abdominal pain and significant weight loss. Dx of colon stricture. 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 PT Re-eval-09/11/14    PT Comments    Pt was able to increase gait tolerance to 50 feet this tx; O2 sats averaged in low 90% during gait on 3L supp O2.  Pt is supervision-min A with all mobility this tx.    Follow Up Recommendations  SNF     Equipment Recommendations  None recommended by PT    Recommendations for Other Services       Precautions / Restrictions Precautions Precautions: Fall Restrictions Weight Bearing Restrictions: No    Mobility  Bed Mobility Overal bed mobility: Needs Assistance Bed Mobility: Supine to Sit;Sit to Supine     Supine to sit: Supervision Sit to supine: Supervision   General bed mobility comments: increased time for lines  Transfers Overall transfer level: Needs assistance Equipment used: Rolling walker (2 wheeled) Transfers: Sit to/from Stand Sit to Stand: Supervision            Ambulation/Gait Ambulation/Gait assistance: Min assist Ambulation Distance (Feet): 50 Feet Assistive device: Rolling walker (2 wheeled) Gait Pattern/deviations: Step-to pattern Gait velocity: decr   General Gait Details: Pt fatigues easily; required 2 standing rest breaks; O2 sats in low 90% during giat; on 3L O2   Stairs            Wheelchair Mobility    Modified Rankin (Stroke Patients Only)       Balance                                    Cognition Arousal/Alertness: Awake/alert Behavior During Therapy: WFL for tasks assessed/performed Overall Cognitive Status: Within Functional Limits for tasks assessed                       Exercises      General Comments        Pertinent Vitals/Pain Pain Assessment: 0-10 Pain Score: 5  Pain Location: abdomen Pain Intervention(s): Monitored during session;Repositioned;PCA encouraged    Home Living                      Prior Function            PT Goals (current goals can now be found in the care plan section) Progress towards PT goals: Progressing toward goals    Frequency  Min 3X/week    PT Plan Current plan remains appropriate    Co-evaluation             End of Session Equipment Utilized During Treatment: Gait belt;Oxygen Activity Tolerance: Patient tolerated treatment well Patient left: in bed;with call bell/phone within reach;with bed alarm set     Time: 1336-1410 PT Time Calculation (min) (ACUTE ONLY): 34 min  Charges:  $Gait Training: 8-22 mins $Therapeutic Activity: 8-22 mins                    G Codes:      Ronald Mayo,Ronald Mayo, SPTA 09/20/2014, 3:17 PM   Reviewed above  Ronald Mayo  PTA [facility]  Acute  Rehab Pager      [phone]

## 2014-09-20 NOTE — Progress Notes (Signed)
SUBJECTIVE:   No specific cardiac complaints.   Rate better.  OBJECTIVE:   Vitals:   Filed Vitals:   09/20/14 0321 09/20/14 0431 09/20/14 0521 09/20/14 0842  BP:   112/73 124/66  Pulse:   85 94  Temp:   97.5 F (36.4 C) 98.2 F (36.8 C)  TempSrc:   Oral Oral  Resp: 15 15 16 18   Height:      Weight:      SpO2: 94% 98% 98% 99%   I&O's:    Intake/Output Summary (Last 24 hours) at 09/20/14 1003 Last data filed at 09/20/14 0525  Gross per 24 hour  Intake 1966.48 ml  Output   2555 ml  Net -588.52 ml        PHYSICAL EXAM General: Well developed, well nourished, in no acute distress Head:   Normal cephalic and atramatic  Lungs:  No wheezing Heart:  irregular S1 S2  No JVD.  88 apical HR Abdomen: nondistended; ostomy present Msk:  Weak generally Extremities:   No edema.   Neuro: Hard of hearing Psych:  Normal affect, responds appropriately Skin: No rash   LABS: Basic Metabolic Panel:  Recent Labs  78/29/5610/10/22 0520 09/20/14 0443  NA 133* 134*  K 4.2 4.3  CL 93* 94*  CO2 30 30  GLUCOSE 138* 127*  BUN 16 18  CREATININE 0.66 0.69  CALCIUM 8.4 8.5  MG 2.2 2.1  PHOS 4.8* 3.9   Liver Function Tests:  Recent Labs  09/19/14 0520  AST 22  ALT 16  ALKPHOS 108  BILITOT 0.3  PROT 5.9*  ALBUMIN 1.4*   No results for input(s): LIPASE, AMYLASE in the last 72 hours. CBC:  Recent Labs  09/19/14 0520 09/20/14 0443  WBC 15.6* 16.9*  HGB 8.7* 8.8*  HCT 26.4* 27.5*  MCV 92.3 94.5  PLT 558* 598*   Cardiac Enzymes: No results for input(s): CKTOTAL, CKMB, CKMBINDEX, TROPONINI in the last 72 hours. BNP: Invalid input(s): POCBNP D-Dimer: No results for input(s): DDIMER in the last 72 hours. Hemoglobin A1C: No results for input(s): HGBA1C in the last 72 hours. Fasting Lipid Panel: No results for input(s): CHOL, HDL, LDLCALC, TRIG, CHOLHDL, LDLDIRECT in the last 72 hours. Thyroid Function Tests: No results for input(s): TSH, T4TOTAL, T3FREE, THYROIDAB in  the last 72 hours.  Invalid input(s): FREET3 Anemia Panel: No results for input(s): VITAMINB12, FOLATE, FERRITIN, TIBC, IRON, RETICCTPCT in the last 72 hours. Coag Panel:   Lab Results  Component Value Date   INR 1.11 09/16/2014   INR 1.26 09/13/2014   INR 1.08 2020/11/1113    RADIOLOGY: Dg Chest 1 View  09/12/2014   CLINICAL DATA:  PICC placement.  EXAM: CHEST - 1 VIEW 9:37 p.m.  COMPARISON:  09/12/2014 at 6:57 p.m.  FINDINGS: PICC tip is now  17 mm below the carina in good position.  Heart size and pulmonary vascularity are normal. Minimal atelectasis at the left lung base. Lungs are otherwise clear.  No acute osseous abnormality. Old deformity of the proximal right humerus.  IMPRESSION: PICC in good position.  Minimal atelectasis at the left lung base.   Electronically Signed   By: Geanie CooleyJim  Maxwell M.D.   On: 09/12/2014 22:01   Ct Abdomen Pelvis W Contrast  09/16/2014   ADDENDUM REPORT: 09/16/2014 16:23  ADDENDUM: I discussed the results of this study with Dr. Maisie Fushomas at approximately 1620 hours on 09/16/2014.   Electronically Signed   By: Kennith CenterEric  Mansell M.D.   On: 09/16/2014  16:23   09/16/2014   CLINICAL DATA:  Subsequent encounter for longstanding ulcerative colitis with recent diagnosis is sigmoid stricture. Patient is status post total colectomy with end ileostomy on 2020/03/2514. Rising white cell count.  EXAM: CT ABDOMEN AND PELVIS WITH CONTRAST  TECHNIQUE: Multidetector CT imaging of the abdomen and pelvis was performed using the standard protocol following bolus administration of intravenous contrast.  CONTRAST:  100mL OMNIPAQUE IOHEXOL 300 MG/ML  SOLN  COMPARISON:  09/04/2014  FINDINGS: Lower chest: Compressive atelectasis is noted in both lower lobes with small bilateral pleural effusions.  Hepatobiliary: No focal abnormality within the liver parenchyma. Gallbladder is distended without evidence for stones. No intrahepatic or extrahepatic biliary dilation.  Pancreas: No focal mass lesion. No  dilatation of the main duct. No intraparenchymal cyst. No peripancreatic edema.  Spleen: No splenomegaly. No focal mass lesion.  Adrenals/Urinary Tract: No adrenal nodule or mass. Bilateral renal cysts are again noted. No hydronephrosis or evidence of hydroureter. Foley catheter decompresses the urinary bladder. Gas in the bladder is compatible with the instrumentation.  Stomach/Bowel: Stomach is moderately distended. Duodenum is normal in appearance. No evidence for small bowel dilatation. No small bowel wall thickening. Gas is visible in the ileum and there is air in the ileal loop tracking out the stoma to the right abdominal and ileostomy. Patient is status post subtotal colectomy. Fluid filled Hartmann's pouch is evident.  Vascular/Lymphatic: Atherosclerotic calcification is noted in the wall of the abdominal aorta without aneurysm. No gastrohepatic or hepatoduodenal ligament lymphadenopathy. No retroperitoneal lymphadenopathy. No evidence for pelvic sidewall lymphadenopathy.  Reproductive: Prostate gland and seminal vesicles are unremarkable.  Other: 4.2 x 4.4 x 4.8 cm focal area of fluid and debris and gas is identified in the right mesentery, just lateral to the superior mesenteric vein. While the remaining areas of intraperitoneal free fluid in the abdomen appear to be more free flowing, this collection appears loculated although it does not have a well-defined or thick rim.  A left abdominal surgical drain tracks down into the pelvis were crosses the midline just caudal to the Hartmann's pouch with the and of the drain coursing along the right pelvic sidewall. There is small volume perihepatic ascites with a small amount of fluid in the right paracolic gutter. Diffuse areas of mesenteric edema are noted, compatible with recent surgery. There is a small amount of free fluid in the mesenteric loops of the pelvis and in some areas this fluid demonstrates subtle peritoneal enhancement, suggesting associated  peritoneal irritation.  The  A small amount of intraperitoneal free air is associated with air in the Right rectus sheath and in the subcutaneous of the right anterior abdominal wall.  Musculoskeletal: Bone windows reveal no worrisome lytic or sclerotic osseous lesions.  IMPRESSION: 4.2 x 4.4 x 4.8 cm focal collection of debris and gas in the right central mesentery, adjacent to the superior mesenteric vein. This particular location has a different appearance than any of the other fluid seen in the abdomen or pelvis and given the history of leukocytosis, raises concern for evolving abscess, although it does not have a well-defined or enhancing rim at this time.  Status post subtotal colectomy with right abdominal end ileostomy. No evidence for bowel obstruction. There is a small amount of ascites adjacent to the liver and in the right paracolic gutter and subtle peritoneal enhancement is seen along some of the fluid collections although they do not appear to be well organized at this time.  A very small amount of  intraperitoneal free air is associated with free air in the rectus sheath and anterior abdominal wall, not unexpected 6 days out from surgery.  Mild dependent atelectasis in the lower lobes bilaterally with small bilateral pleural effusions.  Electronically Signed: By: Kennith Center M.D. On: 09/16/2014 15:56   Ct Abdomen Pelvis W Contrast  09/04/2014   CLINICAL DATA:  Ulcerative colitis with intestinal obstruction K51.912 (ICD-10-CM). 73 year old male with past medical history of diabetes, coronary artery disease, history of atrial fibrillation, history of PE, history of syncope and ulcerative colitis present with exacerbation of ulcerative colitis and found to be in a-fibeval for abd pain and colitis  EXAM: CT ABDOMEN AND PELVIS WITH CONTRAST  TECHNIQUE: Multidetector CT imaging of the abdomen and pelvis was performed using the standard protocol following bolus administration of intravenous contrast.   CONTRAST:  50mL OMNIPAQUE IOHEXOL 300 MG/ML SOLN, OMNIPAQUE IOHEXOL 300 MG/ML SOLN  COMPARISON:  Plain films 01/13/2009.  Most recent CT of 03/14/2007  FINDINGS: Lower chest: Clear lung bases. Mild cardiomegaly. Right coronary artery atherosclerosis. No pericardial or pleural effusion.  Hepatobiliary: Minimal exclusion of the hepatic dome. Mild hepatic steatosis suspected. No focal liver lesion. Focal steatosis adjacent the falciform ligament. Normal gallbladder, without biliary ductal dilatation.  Pancreas: Mild pancreatic atrophy. A tiny cystic focus in the pancreatic body on image 22 of series 2. Alternatively, this could represent interdigitation of peripancreatic fat. Appearance was likely present on the prior exam, suggesting a benign etiology.  Spleen: Normal  Adrenals/Urinary Tract: Normal adrenal glands. Bilateral renal cysts and too small to characterize lesions. No hydronephrosis. Normal ureters and urinary bladder.  Stomach/Bowel: Normal stomach, without wall thickening. Mild to moderate wall thickening involving the sigmoid and rectum. The descending colon is underdistended, without definite inflammation. There is also wall thickening involving the transverse colon. Hyperemia as evidenced by prominence of the Vasa recta. Ascending colon, terminal ileum, and appendix all normal. Normal small bowel without abdominal ascites.  Vascular/Lymphatic: Moderate aortic and branch vessel atherosclerosis. No retroperitoneal or retrocrural adenopathy. No pelvic adenopathy.  Reproductive:  Mild prostatomegaly.  Other: No significant free fluid. Fat containing left inguinal hernia.  Musculoskeletal: No acute osseous abnormality. 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  IMPRESSION: 1. Left-sided colitis, likely representing active ulcerative colitis. Infectious could look similar. Ischemia felt unlikely, given distribution and clinical history. 2. Atherosclerosis,  including within the coronary arteries. 3. Hepatic steatosis.   Electronically Signed   By: Jeronimo Greaves M.D.   On: 09/04/2014 17:06   Dg Chest Port 1 View  09/19/2014   CLINICAL DATA:  Adjustment of central catheter  EXAM: PORTABLE CHEST - 1 VIEW  COMPARISON:  September 12, 2014  FINDINGS: Central catheter tip is in the midportion of the superior vena cava. No pneumothorax. Nasogastric tube tip and side port are below the diaphragm. No pneumothorax. There is no appreciable edema or consolidation. Heart is upper normal in size with pulmonary vascularity within normal limits. No adenopathy. No bone lesions.  IMPRESSION: Tube and catheter positions as described without pneumothorax. No edema or consolidation.   Electronically Signed   By: Bretta Bang M.D.   On: 09/19/2014 10:08   Dg Chest Port 1 View  09/12/2014   CLINICAL DATA:  Confirm PICC line placement.  EXAM: PORTABLE CHEST - 1 VIEW  COMPARISON:  09/06/2014  FINDINGS: There is a right arm PICC line. The catheter tip appears to be in the right atrium. The catheter could be pulled back 6 cm  for positioning in the lower SVC. Low lung volumes. No focal airspace disease. Heart size is stable.  IMPRESSION: PICC line tip in the right atrium.   Electronically Signed   By: Richarda Overlie M.D.   On: 09/12/2014 19:15   Dg Chest Port 1 View  09/06/2014   CLINICAL DATA:  73 year old male status post PICC line placement. Initial encounter.  EXAM: PORTABLE CHEST - 1 VIEW  COMPARISON:  CT Abdomen and Pelvis 09/04/2014.  FINDINGS: Portable AP semi upright view at 1553 hrs. Right side PICC line placed, tip projects just below the carina. Normal cardiac size and mediastinal contours. Visualized tracheal air column is within normal limits. No pneumothorax. Somewhat low lung volumes. Mild increased interstitial markings, stable. Otherwise Allowing for portable technique, the lungs are clear.  IMPRESSION: 1. Right PICC line placed, tip at the SVC level. 2.  No acute  cardiopulmonary abnormality.   Electronically Signed   By: Augusto Gamble M.D.   On: 09/06/2014 16:21      ASSESSMENT: Roosvelt Harps:    1) AFib: rate controlled.  Hbg stable.  Would benefit from anticoagulation in order to prevent stroke.  In reviewing the initial consult note from Dr. Clifton James, it was noted that the patient has been off of anticoagulation for many years due to his ulcerative colitis.  Was on IV heparin, but this was stopped  due to rectal bleeding.  Long term Bleeding risk is lower after the surgery but he still has UC disease in the rectum.  Would only think about Coumadin when surgical issues clear.  May not be a candidate given recent bleeding.  Switch to oral metoprolol when tolerating pills.  No longer on tele.  HR measured on exam by me apically.    Corky Crafts., MD  09/20/2014  10:03 AM

## 2014-09-21 LAB — CBC
HCT: 25.9 % — ABNORMAL LOW (ref 39.0–52.0)
Hemoglobin: 8.5 g/dL — ABNORMAL LOW (ref 13.0–17.0)
MCH: 30.8 pg (ref 26.0–34.0)
MCHC: 32.8 g/dL (ref 30.0–36.0)
MCV: 93.8 fL (ref 78.0–100.0)
PLATELETS: 632 10*3/uL — AB (ref 150–400)
RBC: 2.76 MIL/uL — ABNORMAL LOW (ref 4.22–5.81)
RDW: 14.4 % (ref 11.5–15.5)
WBC: 12.6 10*3/uL — AB (ref 4.0–10.5)

## 2014-09-21 LAB — GLUCOSE, CAPILLARY
GLUCOSE-CAPILLARY: 125 mg/dL — AB (ref 70–99)
GLUCOSE-CAPILLARY: 102 mg/dL — AB (ref 70–99)
Glucose-Capillary: 163 mg/dL — ABNORMAL HIGH (ref 70–99)
Glucose-Capillary: 122 mg/dL — ABNORMAL HIGH (ref 70–99)

## 2014-09-21 MED ORDER — TRACE MINERALS CR-CU-F-FE-I-MN-MO-SE-ZN IV SOLN
INTRAVENOUS | Status: AC
  Administered 2014-09-21: 18:00:00 via INTRAVENOUS
  Filled 2014-09-21: qty 3000

## 2014-09-21 MED ORDER — INSULIN ASPART 100 UNIT/ML ~~LOC~~ SOLN
0.0000 [IU] | Freq: Three times a day (TID) | SUBCUTANEOUS | Status: DC
  Administered 2014-09-21: 3 [IU] via SUBCUTANEOUS
  Administered 2014-09-21 – 2014-09-23 (×4): 2 [IU] via SUBCUTANEOUS
  Administered 2014-09-23 – 2014-09-24 (×3): 3 [IU] via SUBCUTANEOUS
  Administered 2014-09-24 – 2014-09-25 (×3): 2 [IU] via SUBCUTANEOUS
  Administered 2014-09-25: 3 [IU] via SUBCUTANEOUS
  Administered 2014-09-25 – 2014-09-26 (×2): 2 [IU] via SUBCUTANEOUS
  Administered 2014-09-26 (×2): 3 [IU] via SUBCUTANEOUS
  Administered 2014-09-27: 2 [IU] via SUBCUTANEOUS
  Administered 2014-09-27: 3 [IU] via SUBCUTANEOUS

## 2014-09-21 MED ORDER — ENOXAPARIN SODIUM 40 MG/0.4ML ~~LOC~~ SOLN
40.0000 mg | SUBCUTANEOUS | Status: DC
  Administered 2014-09-21: 40 mg via SUBCUTANEOUS
  Filled 2014-09-21 (×3): qty 0.4

## 2014-09-21 MED ORDER — FAT EMULSION 20 % IV EMUL
250.0000 mL | INTRAVENOUS | Status: AC
  Administered 2014-09-21: 250 mL via INTRAVENOUS
  Filled 2014-09-21: qty 250

## 2014-09-21 NOTE — Progress Notes (Signed)
Pt had Lovenox shot this a.m.  Now had moderate amount of bloody/watery discharge from rectum (in bed).  Notified Dr. Izola PriceMyers via text.

## 2014-09-21 NOTE — Progress Notes (Signed)
Patient ID: Ronald Mayo, male   DOB: [birth year], 73 y.o.   MRN: [id number]  [facility] PROGRESS NOTE  Ronald Mayo JXB:[id number] DOB: 1941-02-04 DOA: 09/02/2014 PCP: Aniceto Boss, MD    Brief narrative: 73 year old male with ulcerative colitis on sulfasalazine, DM type II (on Amaryl), presented 10/26 with main concern of several weeks duration or persistent diarrhea and more recently blood in stool. This has been associated with poor oral intake, malaise, weight loss, subjective fevers, chills, intermittent abd cramping, non radiation, with no specific alleviating or aggravating factors. Ct abd and pelvis c/w active UC. 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 Dwain Sarna 11/03. Now with concern for post op stump leak, based on drainage from the JP/CT findings: 4.2 x 4.4 x 4.8 cm focal collection of debris and gas in the right central mesentery. Due to complex chronic medical conditions imposed on new acute complications noted below, will change attending team to [facility]. Discussed with surgery team.   Assessment and Plan:    Impression/Recommendations Active Problems:  Ulcerative colitis - with sigmoid stricture - on colonoscopy by Dr. Leone Payor - 09/04/2014  - pt is now s/p total abdominal colectomy with end ileostomy by Dr. Dwain Sarna 11/03 - concern of post op stump leak, based on drainage from the JP/CT findings: 4.2 x 4.4 x 4.8 cm focal collection of debris and gas in the right central mesentery - management per primary team - continue Zosyn day #6 for mesenteric abscess  - appreciate surgery assistance  - NGT out and pt reports feeling better  Acute on chronic blood loss anemia, UC with post op bleed - Hg trending down over the past 72 hours: 9.7 --> 9.2 --> 8.7 --> 8.8 --> 8.5 - close monitoring and repeat CBC in AM Atrial fib - rate controlled - not AC candidate due to acute issues, per cardiology - appreciate assistance of cardiologist   - will plan to convert to PO Metoprolol once pt able to take PO, possibly in AM Leukocytosis - secondary to the above, active problem - WBC trending down  - continue Zosyn day #6, abd cultures pending  - CBC in AM DM type II - pt at home on Amaryl, on hold  - continue only SSI for now Protein-calorie malnutrition, severe  - secondary to progressive nature of UC and acute illness  - NGT out 11/4, pt stable and reports feeling better  Chronic venous insufficiency ulcerations, left medial malleolus, left heel. - Measurement: medial malleolar ulceration has healed. Left heel ulcer measures 0.5cm x 0.8cm, x 0.4cm - Wound ZHY:QMVHQ, pink, moist, mild drainage serous  - recommend regranex applied following cleanse with NS, and pat dry. This is topped with a soft silicone foam dressing - appreciate wound care team assistance  Ostomy  - RUQ ileostomy, 1 and 5/8 inches round, red, slightly budded - appreciate wound care team help  Acute functional quadriplegia - OOB to chair if pt able to tolerate   DVT prophylaxis  Lovenox SQ  Code Status: Full Family Communication: Pt at bedside Disposition Plan: Remains inpatient   IV Access:    Peripheral IV Procedures and diagnostic studies:    Ct Abdomen Pelvis W Contrast 09/16/2014 4.2 x 4.4 x 4.8 cm focal collection of debris and gas in the right central mesentery, adjacent to the superior mesenteric vein. This particular location has a different appearance than any of the other fluid seen in the abdomen or pelvis and given the history of leukocytosis, raises  concern for evolving abscess, although it does not have a well-defined or enhancing rim at this time. Status post subtotal colectomy with right abdominal end ileostomy. No evidence for bowel obstruction. There is a small amount of ascites adjacent to the liver and in the right paracolic gutter and subtle peritoneal enhancement is seen along some of the fluid collections  although they do not appear to be well organized at this time. A very small amount of intraperitoneal free air is associated with free air in the rectus sheath and anterior abdominal wall, not unexpected 6 days out from surgery.  Ct Abdomen Pelvis W Contrast 09/04/2014 Left-sided colitis, likely representing active ulcerative colitis. Infectious could look similar. Ischemia felt unlikely, given distribution and clinical history.Atherosclerosis, coronary arteries   Dg Chest Port 1 View 09/12/2014 PICC line tip in the right atrium.   Dg Chest Port 1 View 09/06/2014 Right PICC line placed, tip at the SVC level. No acute cardiopulmonary abnormality.  Medical Consultants:    Surgery  Cardiology   GI Other Consultants:    PT - recommend SNF upon discharge  OT - recommend SNF upon discharge  Wound care team  Anti-Infectives:    Zosyn 11/09 -->  Debbora PrestoMAGICK-Marshae Azam, MD  [facility] Pager [phone]  If 7PM-7AM, please contact night-coverage [email or web address] Password [email or web address] 09/21/2014, 11:17 AM   LOS: 19 days   HPI/Subjective: No events overnight.   Objective: Filed Vitals:   09/21/14 0000 09/21/14 0405 09/21/14 0553 09/21/14 0855  BP:   131/74   Pulse:   84   Temp:   97.7 F (36.5 C)   TempSrc:   Oral   Resp: 18 18 18 19   Height:      Weight:      SpO2: 100% 100% 96% 98%    Intake/Output Summary (Last 24 hours) at 09/21/14 1117 Last data filed at 09/21/14 0600  Gross per 24 hour  Intake   1330 ml  Output   2475 ml  Net  -1145 ml    Exam:   General:  Pt is alert, follows commands appropriately, not in acute distress  Cardiovascular: Iegular rate and rhythm,  no rubs, no gallops  Respiratory: Clear to auscultation bilaterally, no wheezing, no crackles, no rhonchi  Abdomen: Soft, non tender, non distended, no guarding   Data Reviewed: Basic Metabolic Panel:  Recent Labs Lab 09/16/14 0403 09/17/14 0540 09/19/14 0520 09/20/14 0443  NA 135*  135* 133* 134*  K 4.5 4.4 4.2 4.3  CL 95* 95* 93* 94*  CO2 32 31 30 30   GLUCOSE 146* 169* 138* 127*  BUN 16 16 16 18   CREATININE 0.59 0.60 0.66 0.69  CALCIUM 8.1* 8.3* 8.4 8.5  MG 2.0  --  2.2 2.1  PHOS 4.4  --  4.8* 3.9   Liver Function Tests:  Recent Labs Lab 09/16/14 0403 09/19/14 0520  AST 14 22  ALT 16 16  ALKPHOS 81 108  BILITOT 1.1 0.3  PROT 5.0* 5.9*  ALBUMIN 1.1* 1.4*   CBC:  Recent Labs Lab 09/16/14 0403  09/18/14 0540 09/18/14 1400 09/19/14 0520 09/20/14 0443 09/21/14 0405  WBC 23.3*  < > 18.6* 16.6* 15.6* 16.9* 12.6*  NEUTROABS 18.8*  --   --   --   --   --   --   HGB 9.7*  < > 8.4* 8.2* 8.7* 8.8* 8.5*  HCT 28.2*  < > 25.2* 25.0* 26.4* 27.5* 25.9*  MCV 90.7  < > 92.3 93.6 92.3 94.5 93.8  PLT 429*  < > 479* 478* 558* 598* 632*  < > = values in this interval not displayed.  CBG:  Recent Labs Lab 09/20/14 1603 09/20/14 2037 09/20/14 2337 09/21/14 0425 09/21/14 0754  GLUCAP 124* 188* 100* 125* 102*    Recent Results (from the past 240 hour(s))  Wound culture     Status: None   Collection Time: 09/18/14 10:14 AM  Result Value Ref Range Status   Specimen Description ABDOMEN  Final   Special Requests Immunocompromised  Final   Gram Stain   Final    ABUNDANT WBC PRESENT,BOTH PMN AND MONONUCLEAR NO SQUAMOUS EPITHELIAL CELLS SEEN FEW GRAM POSITIVE COCCI IN PAIRS [facility]    Culture   Final    MULTIPLE ORGANISMS PRESENT, NONE PREDOMINANT Note: NO STAPHYLOCOCCUS AUREUS ISOLATED NO GROUP A STREP (S.PYOGENES) ISOLATED Performed at [facility]    Report Status 09/20/2014 FINAL  Final     Scheduled Meds: . acetaminophen  1,000 mg Oral TID  . becaplermin   Topical Q7 days  . citalopram  20 mg Oral Daily  . enoxaparin (LOVENOX) injection  40 mg Subcutaneous Q24H  . HYDROmorphone PCA 0.3 mg/mL   Intravenous 6 times per day  . insulin aspart  0-15 Units Subcutaneous 3 times per day  . lip balm  1 application  Topical BID  . metoprolol  2.5 mg Intravenous 4 times per day  . pantoprazole (PROTONIX) IV  40 mg Intravenous Q24H  . piperacillin-tazobactam (ZOSYN)  IV  3.375 g Intravenous 3 times per day  . sodium chloride  10-40 mL Intracatheter Q12H   Continuous Infusions: . sodium chloride 10 mL/hr at 09/21/14 0600  . Marland Kitchen.TPN (CLINIMIX-E) Adult 100 mL/hr at 09/20/14 1733   And  . fat emulsion 250 mL (09/20/14 1732)  . Marland Kitchen.TPN (CLINIMIX-E) Adult     And  . fat emulsion

## 2014-09-21 NOTE — Progress Notes (Signed)
PARENTERAL NUTRITION CONSULT NOTE - Follow Up  Pharmacy Consult for TPN Indication: Severe ulcerative colitis and malnutrition  No Known Allergies  Patient Measurements: Height: 6' (182.9 cm) Weight: 229 lb 8 oz (104.1 kg) IBW/kg (Calculated) : 77.6 Usual Weight: ~ 246lbs (30 lb wt loss over 3 months)  Vital Signs: Temp: 97.7 F (36.5 C) (11/14 0553) Temp Source: Oral (11/14 0553) BP: 131/74 mmHg (11/14 0553) Pulse Rate: 84 (11/14 0553) Intake/Output from previous day: 11/13 0701 - 11/14 0700 In: 2622.3 [I.V.:262.3; NG/GT:300; IV Piggyback:300; TPN:1760] Out: 2727 [Urine:2625; Drains:2; Stool:100] Intake/Output from this shift:   Labs:  Recent Labs  09/19/14 0520 09/20/14 0443 09/21/14 0405  WBC 15.6* 16.9* 12.6*  HGB 8.7* 8.8* 8.5*  HCT 26.4* 27.5* 25.9*  PLT 558* 598* 632*    Recent Labs  09/19/14 0520 09/20/14 0443  NA 133* 134*  K 4.2 4.3  CL 93* 94*  CO2 30 30  GLUCOSE 138* 127*  BUN 16 18  CREATININE 0.66 0.69  CALCIUM 8.4 8.5  MG 2.2 2.1  PHOS 4.8* 3.9  PROT 5.9*  --   ALBUMIN 1.4*  --   AST 22  --   ALT 16  --   ALKPHOS 108  --   BILITOT 0.3  --    Estimated Creatinine Clearance: 104.1 mL/min (by C-G formula based on Cr of 0.69).    Recent Labs  09/20/14 2337 09/21/14 0425 09/21/14 0754  GLUCAP 100* 125* 102*   Insulin Requirements:  8 units/24 hr: SSI moderate q4h  Current Nutrition: NPO, chips and sips  IVF: NS at 1510ml/hr   Central access:  PICC placed 10/30 TPN start date: 10/30  ASSESSMENT HPI: [id number] YOM with known h/o ulcerative colitis.  Colonoscopy reveals severe UC and proctitis with friable strictures.  Biopsies consistent with UC and not malignancy. General surgery consulted and plan is for proctocolectomy and J-pouch.  Pharmacy asked to start TPN to improve nutritional status for surgery. 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 NG clamping trials.  11/14: No labs today  Glucose - CBGs at goal < 150.  Electrolytes - Na slightly low, Phos slightly elevated 11/12, now WNL today, Cl low (unable to adjust lytes in premixed TPN formulation).  Renal - SCr WNL  LFTs - WNL  TGs - 96 (11/2), 144 (11/9)  Prealbumin - 12.7 (10/30), 10.3 (11/2), 5 (11/9)  NUTRITIONAL GOALS RD recs: 2200-2400 kcals, 115-130gm protein Clinimix 5/15 at a goal rate of 17500ml/hr + 20% fat emulsion at 6710ml/hr to provide: 120g/day protein, 2184Kcal/day.  PLAN   Cont Clinimix E 5/15 at goal rate of 13700ml/hr.  Continue with regular insulin 75 units in 3L TPN bag (25 units/L) - to deliver 60 units regular insulin/24h.  20% fat emulsion at 3010ml/hr.  TPN to contain standard multivitamins and trace elements.  [facility]  Continue moderate SSI, reduce CBGs to q8h.  TPN lab panels on Mondays & Thursdays.  BMET, Mag, Phos in AM.  F/u daily.  Charolotte Ekeom Zeev Deakins, PharmD, pager [phone]. 09/21/2014,8:54 AM.

## 2014-09-21 NOTE — Progress Notes (Signed)
Went to pt's room and found Prevalon boot had been removed.  Relative said, "He wanted it off; he told me to remove it."  Unable to reapply.   Pt getting aggitated and said, "My doctor didn't even order it.  That was brought from home and I don't need it."  Pt began detailing history of wound, but family member told RN, "that's not correct."  Elevated heel to remove pressure.  Pt agreed that he would wear boot while sleeping.  Will advise pm nurse.

## 2014-09-21 NOTE — Progress Notes (Signed)
11 Days Post-Op  Subjective: No issues with ng clamped, pain controlled, appropriate  Objective: Vital signs in last 24 hours: Temp:  [97.6 F (36.4 C)-98.6 F (37 C)] 97.7 F (36.5 C) (11/14 0553) Pulse Rate:  [84-94] 84 (11/14 0553) Resp:  [17-22] 18 (11/14 0553) BP: (110-138)/(64-74) 131/74 mmHg (11/14 0553) SpO2:  [95 %-100 %] 96 % (11/14 0553) Last BM Date: 09/20/14 (blood noted from rectum but no stool)  Intake/Output from previous day: 11/13 0701 - 11/14 0700 In: 2622.3 [I.V.:262.3; NG/GT:300; IV Piggyback:300; TPN:1760] Out: 2727 [Urine:2625; Drains:2; Stool:100] Intake/Output this shift: Total I/O In: 160 [I.V.:160] Out: 1175 [Urine:1125; Stool:50]  General appearance: no distress Resp: clear to auscultation bilaterally Cardio: irregularly irregular rhythm GI: soft wound open and soupy no obvious dehiscence, bs present, ileostomy fxnl  Lab Results:   Recent Labs  09/20/14 0443 09/21/14 0405  WBC 16.9* 12.6*  HGB 8.8* 8.5*  HCT 27.5* 25.9*  PLT 598* 632*   BMET  Recent Labs  09/19/14 0520 09/20/14 0443  NA 133* 134*  K 4.2 4.3  CL 93* 94*  CO2 30 30  GLUCOSE 138* 127*  BUN 16 18  CREATININE 0.66 0.69  CALCIUM 8.4 8.5   PT/INR No results for input(s): LABPROT, INR in the last 72 hours. ABG No results for input(s): PHART, HCO3 in the last 72 hours.  Invalid input(s): PCO2, PO2  Studies/Results: Dg Chest Port 1 View  09/19/2014   CLINICAL DATA:  Adjustment of central catheter  EXAM: PORTABLE CHEST - 1 VIEW  COMPARISON:  September 12, 2014  FINDINGS: Central catheter tip is in the midportion of the superior vena cava. No pneumothorax. Nasogastric tube tip and side port are below the diaphragm. No pneumothorax. There is no appreciable edema or consolidation. Heart is upper normal in size with pulmonary vascularity within normal limits. No adenopathy. No bone lesions.  IMPRESSION: Tube and catheter positions as described without pneumothorax. No edema  or consolidation.   Electronically Signed   By: Bretta BangWilliam  Woodruff M.D.   On: 09/19/2014 10:08    Anti-infectives: Anti-infectives    Start     Dose/Rate Route Frequency Ordered Stop   09/16/14 1630  piperacillin-tazobactam (ZOSYN) IVPB 3.375 g     3.375 g12.5 mL/hr over 240 Minutes Intravenous 3 times per day 09/16/14 1620     09/10/14 2200  cefoTEtan (CEFOTAN) 2 g in dextrose 5 % 50 mL IVPB     2 g100 mL/hr over 30 Minutes Intravenous Every 12 hours 09/10/14 1644 09/11/14 1130   09/10/14 0600  cefoTEtan (CEFOTAN) 2 g in dextrose 5 % 50 mL IVPB     2 g100 mL/hr over 30 Minutes Intravenous On call to O.R. 09/09/14 1321 09/10/14 1142      Assessment/Plan: POD 11 tac/ileostomy  1. Neuro- cont po tylenol, pca will transition to orals tomorrow if does well with ng out 2. Cv/pulm- oob, ambulate, pulm toilet 3. GI- sips/chips, advance tomorrow if continues to improve, rectal stump leak much better 4. Id- cont abx, I think the area in mesentery is where I put a piece of surgicel snow. If wbc continues to improve would follow clinically don't know that he needs to be reimaged.   5. Renal- no issues 6. Protonix, scds, lovenox will start as not ready for systemic ac maybe early next week try again due to rectal bleeding 7. Consider vac on wound at later date  [facility]KE[facility],Ronald Mayo 09/21/2014

## 2014-09-21 NOTE — Progress Notes (Signed)
PROGRESS NOTE  Subjective:   73 yo with ulcerative colitis and hx of Atrial fib., DM, CAD , PE He was admitted with an exacerbation of ulcerative colotis, with GI bleeding  and was found to be in atrial fib  He was previously seen by Dr. Andee Linemanegent.   He denies any CP or dyspnea Cannot tell that his heart rate is irregular. Just complains of abdominal issues  Objective:    Vital Signs:   Temp:  [97.6 F (36.4 C)-98.6 F (37 C)] 97.7 F (36.5 C) (11/14 0553) Pulse Rate:  [84-94] 84 (11/14 0553) Resp:  [17-22] 18 (11/14 0553) BP: (110-138)/(64-74) 131/74 mmHg (11/14 0553) SpO2:  [95 %-100 %] 96 % (11/14 0553)  Last BM Date: 09/20/14 (blood noted from rectum but no stool)   24-hour weight change: Weight change:   Weight trends: Filed Weights   09/10/14 1615 09/14/14 0400 09/17/14 0400  Weight: 224 lb 6.9 oz (101.8 kg) 221 lb 12.5 oz (100.6 kg) 229 lb 8 oz (104.1 kg)    Intake/Output:  11/13 0701 - 11/14 0700 In: 2622.3 [I.V.:262.3; NG/GT:300; IV Piggyback:300; TPN:1760] Out: 2727 [Urine:2625; Drains:2; Stool:100]     Physical Exam: BP 131/74 mmHg  Pulse 84  Temp(Src) 97.7 F (36.5 C) (Oral)  Resp 18  Ht 6' (1.829 m)  Wt 229 lb 8 oz (104.1 kg)  BMI 31.12 kg/m2  SpO2 96%  Wt Readings from Last 3 Encounters:  09/17/14 229 lb 8 oz (104.1 kg)  09/02/14 220 lb (99.791 kg)  08/13/14 238 lb 9.6 oz (108.228 kg)    General: Vital signs reviewed and noted.   Head: Normocephalic, atraumatic.  Eyes: conjunctivae/corneas clear.  EOM's intact.   Throat: normal  Neck:    Lungs:    clear  Heart:  Irreg  Abdomen:  Percutaneous drain in place   Extremities: Left foot in a splint, no edema   Neurologic: A&O X3, CN II - XII are grossly intact., ? Hard of hearing  Psych: Normal     Labs: BMET:  Recent Labs  09/19/14 0520 09/20/14 0443  NA 133* 134*  K 4.2 4.3  CL 93* 94*  CO2 30 30  GLUCOSE 138* 127*  BUN 16 18  CREATININE 0.66 0.69  CALCIUM 8.4 8.5    MG 2.2 2.1  PHOS 4.8* 3.9    Liver function tests:  Recent Labs  09/19/14 0520  AST 22  ALT 16  ALKPHOS 108  BILITOT 0.3  PROT 5.9*  ALBUMIN 1.4*   No results for input(s): LIPASE, AMYLASE in the last 72 hours.  CBC:  Recent Labs  09/20/14 0443 09/21/14 0405  WBC 16.9* 12.6*  HGB 8.8* 8.5*  HCT 27.5* 25.9*  MCV 94.5 93.8  PLT 598* 632*    Cardiac Enzymes: No results for input(s): CKTOTAL, CKMB, TROPONINI in the last 72 hours.  Coagulation Studies: No results for input(s): LABPROT, INR in the last 72 hours.   Other results:  Not on tele   Medications:    Infusions: . sodium chloride 10 mL/hr at 09/21/14 0600  . Marland Kitchen.TPN (CLINIMIX-E) Adult 100 mL/hr at 09/20/14 1733   And  . fat emulsion 250 mL (09/20/14 1732)    Scheduled Medications: . acetaminophen  1,000 mg Oral TID  . becaplermin   Topical Q7 days  . citalopram  20 mg Oral Daily  . enoxaparin (LOVENOX) injection  40 mg Subcutaneous Q24H  . HYDROmorphone PCA 0.3 mg/mL   Intravenous 6 times  per day  . insulin aspart  0-15 Units Subcutaneous 6 times per day  . lip balm  1 application Topical BID  . metoprolol  2.5 mg Intravenous 4 times per day  . pantoprazole (PROTONIX) IV  40 mg Intravenous Q24H  . piperacillin-tazobactam (ZOSYN)  IV  3.375 g Intravenous 3 times per day  . sodium chloride  10-40 mL Intracatheter Q12H    Assessment/ Plan:      1.  Ulcerative colitis with intestinal obstruction:  S/p surgery. Has had hx of bleeding in the past.   Plans per surgery and GI    2. Coronary atherosclerosis:  Stable.  No symptoms of ischemia    3. Chronic atrial fibrillation:  Rate is stable, non a candidate for anticoaulation at this time. [place] as needed.  No further recs Continue current meds  Will sign off. Call for questions.    Length of Stay: 919  Vesta MixerPhilip J. Nahser, Montez HagemanJr., MD, [facility] 09/21/2014, 7:31 AM Office [phone] Pager [phone]

## 2014-09-22 ENCOUNTER — Inpatient Hospital Stay (HOSPITAL_COMMUNITY): Payer: Medicare PPO

## 2014-09-22 LAB — BASIC METABOLIC PANEL
Anion gap: 11 (ref 5–15)
BUN: 17 mg/dL (ref 6–23)
CO2: 28 meq/L (ref 19–32)
Calcium: 8.7 mg/dL (ref 8.4–10.5)
Chloride: 98 mEq/L (ref 96–112)
Creatinine, Ser: 0.7 mg/dL (ref 0.50–1.35)
GFR calc Af Amer: >90 mL/min (ref >90)
GFR calc non Af Amer: >90 mL/min (ref >90)
GLUCOSE: 92 mg/dL (ref 70–99)
POTASSIUM: 4.3 meq/L (ref 3.7–5.3)
SODIUM: 137 meq/L (ref 137–147)

## 2014-09-22 LAB — URINALYSIS, ROUTINE W REFLEX MICROSCOPIC
Bilirubin Urine: NEGATIVE
GLUCOSE, UA: NEGATIVE mg/dL
Hgb urine dipstick: NEGATIVE
KETONES UR: NEGATIVE mg/dL
LEUKOCYTES UA: NEGATIVE
Nitrite: NEGATIVE
Protein, ur: NEGATIVE mg/dL
Specific Gravity, Urine: 1.013 (ref 1.005–1.030)
Urobilinogen, UA: 0.2 mg/dL (ref 0.0–1.0)
pH: 7 (ref 5.0–8.0)

## 2014-09-22 LAB — CBC
HEMATOCRIT: 26.4 % — AB (ref 39.0–52.0)
HEMATOCRIT: 26.6 % — AB (ref 39.0–52.0)
HEMOGLOBIN: 8.6 g/dL — AB (ref 13.0–17.0)
Hemoglobin: 8.5 g/dL — ABNORMAL LOW (ref 13.0–17.0)
MCH: 30.2 pg (ref 26.0–34.0)
MCH: 30 pg (ref 26.0–34.0)
MCHC: 32.6 g/dL (ref 30.0–36.0)
MCHC: 32 g/dL (ref 30.0–36.0)
MCV: 92.6 fL (ref 78.0–100.0)
MCV: 94 fL (ref 78.0–100.0)
Platelets: 808 10*3/uL — ABNORMAL HIGH (ref 150–400)
Platelets: 819 10*3/uL — ABNORMAL HIGH (ref 150–400)
RBC: 2.85 MIL/uL — AB (ref 4.22–5.81)
RBC: 2.83 MIL/uL — ABNORMAL LOW (ref 4.22–5.81)
RDW: 14.5 % (ref 11.5–15.5)
RDW: 14.5 % (ref 11.5–15.5)
WBC: 14.4 10*3/uL — ABNORMAL HIGH (ref 4.0–10.5)
WBC: 15.7 10*3/uL — ABNORMAL HIGH (ref 4.0–10.5)

## 2014-09-22 LAB — MAGNESIUM: Magnesium: 2.1 mg/dL (ref 1.5–2.5)

## 2014-09-22 LAB — PHOSPHORUS: PHOSPHORUS: 4.3 mg/dL (ref 2.3–4.6)

## 2014-09-22 LAB — GLUCOSE, CAPILLARY
Glucose-Capillary: 124 mg/dL — ABNORMAL HIGH (ref 70–99)
Glucose-Capillary: 142 mg/dL — ABNORMAL HIGH (ref 70–99)
Glucose-Capillary: 146 mg/dL — ABNORMAL HIGH (ref 70–99)

## 2014-09-22 MED ORDER — MESALAMINE 1000 MG RE SUPP
1000.0000 mg | Freq: Every day | RECTAL | Status: DC
  Administered 2014-09-22 – 2014-09-30 (×9): 1000 mg via RECTAL
  Filled 2014-09-22 (×10): qty 1

## 2014-09-22 MED ORDER — ACETAMINOPHEN 325 MG PO TABS
650.0000 mg | ORAL_TABLET | Freq: Three times a day (TID) | ORAL | Status: DC
  Administered 2014-09-22 – 2014-10-01 (×29): 650 mg via ORAL
  Filled 2014-09-22 (×30): qty 2

## 2014-09-22 MED ORDER — FAT EMULSION 20 % IV EMUL
250.0000 mL | INTRAVENOUS | Status: AC
Start: 2014-09-22 — End: 2014-09-23
  Administered 2014-09-22: 250 mL via INTRAVENOUS
  Filled 2014-09-22: qty 250

## 2014-09-22 MED ORDER — OXYCODONE-ACETAMINOPHEN 5-325 MG PO TABS: 1.0000 | ORAL_TABLET | ORAL | Status: DC | PRN

## 2014-09-22 MED ORDER — HYDROMORPHONE HCL 1 MG/ML IJ SOLN
1.0000 mg | INTRAMUSCULAR | Status: DC | PRN
  Administered 2014-09-22 – 2014-10-01 (×20): 1 mg via INTRAVENOUS
  Filled 2014-09-22 (×20): qty 1

## 2014-09-22 MED ORDER — TRACE MINERALS CR-CU-F-FE-I-MN-MO-SE-ZN IV SOLN
INTRAVENOUS | Status: AC
  Administered 2014-09-22: 17:00:00 via INTRAVENOUS
  Filled 2014-09-22: qty 3000

## 2014-09-22 MED ORDER — HEPARIN SODIUM (PORCINE) 5000 UNIT/ML IJ SOLN
5000.0000 [IU] | Freq: Three times a day (TID) | INTRAMUSCULAR | Status: DC
  Administered 2014-09-24 – 2014-10-01 (×22): 5000 [IU] via SUBCUTANEOUS
  Filled 2014-09-22 (×31): qty 1

## 2014-09-22 MED ORDER — OXYCODONE HCL 5 MG PO TABS
10.0000 mg | ORAL_TABLET | ORAL | Status: DC | PRN
  Administered 2014-09-22 – 2014-10-01 (×22): 10 mg via ORAL
  Filled 2014-09-22 (×22): qty 2

## 2014-09-22 NOTE — Progress Notes (Signed)
PARENTERAL NUTRITION CONSULT NOTE - Follow Up  Pharmacy Consult for TPN Indication: Severe ulcerative colitis and malnutrition  No Known Allergies  Patient Measurements: Height: 6' (182.9 cm) Weight: 229 lb 8 oz (104.1 kg) IBW/kg (Calculated) : 77.6 Usual Weight: ~ 246lbs (30 lb wt loss over 3 months)  Vital Signs: Temp: 98 F (36.7 C) (11/15 16100633) Temp Source: Oral (11/15 96040633) BP: 124/70 mmHg (11/15 54090633) Pulse Rate: 80 (11/15 0633) Intake/Output from previous day: 11/14 0701 - 11/15 0700 In: 946.9 [I.V.:174.1; IV Piggyback:100; TPN:672.8] Out: 2925 [Urine:2850; Stool:75] Intake/Output from this shift: Total I/O In: 0  Out: 300 [Urine:300] Labs:  Recent Labs  09/20/14 0443 09/21/14 0405 09/22/14 0340  WBC 16.9* 12.6* 14.4*  HGB 8.8* 8.5* 8.6*  HCT 27.5* 25.9* 26.4*  PLT 598* 632* 808*    Recent Labs  09/20/14 0443 09/22/14 0340  NA 134* 137  K 4.3 4.3  CL 94* 98  CO2 30 28  GLUCOSE 127* 92  BUN 18 17  CREATININE 0.69 0.70  CALCIUM 8.5 8.7  MG 2.1 2.1  PHOS 3.9 4.3   Estimated Creatinine Clearance: 104.1 mL/min (by C-G formula based on Cr of 0.7).    Recent Labs  09/21/14 1156 09/21/14 2248 09/22/14 0555  GLUCAP 163* 122* 124*   Insulin Requirements:  5 units/24 hr: SSI moderate q4h  Current Nutrition: Trying CL  IVF: NS at 5410ml/hr   Central access:  PICC placed 10/30 TPN start date: 10/30  ASSESSMENT HPI: [id number] YOM with known h/o ulcerative colitis.  Colonoscopy reveals severe UC and proctitis with friable strictures.  Biopsies consistent with UC and not malignancy. General surgery consulted and plan is for proctocolectomy and J-pouch.  Pharmacy asked to start TPN to improve nutritional status for surgery. 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 NG clamping trials. 11/14: NG dc'd.   11/15:  Glucose - CBGs at goal < 150.  Electrolytes - wnl. CorrCa 10.8. Phos trending up, monitor. (unable to adjust lytes in premixed TPN formulation).  Renal - SCr WNL  LFTs - WNL(11/12)  TGs - 96 (11/2), 144 (11/9)  Prealbumin - 12.7 (10/30), 10.3 (11/2), 5 (11/9)  NUTRITIONAL GOALS RD recs: 2200-2400 kcals, 115-130gm protein Clinimix 5/15 at a goal rate of 17400ml/hr + 20% fat emulsion at 2510ml/hr to provide: 120g/day protein, 2184Kcal/day.  PLAN   Cont Clinimix E 5/15 at goal rate of 16600ml/hr.  Continue with regular insulin 75 units in 3L TPN bag (25 units/L) - to deliver 60 units regular insulin/24h.  20% fat emulsion at 1210ml/hr.  TPN to contain standard multivitamins and trace elements.  [facility]  Continue moderate SSI q8h.  TPN lab panels on Mondays & Thursdays.  F/u daily.  Charolotte Ekeom Erina Hamme, PharmD, pager [phone]. 09/22/2014,9:46 AM.

## 2014-09-22 NOTE — Plan of Care (Signed)
Problem: Phase II Progression Outcomes Goal: Discharge plan established Outcome: Progressing  Problem: Discharge Progression Outcomes Goal: Pain controlled with appropriate interventions Outcome: Progressing Pain controlled with PCA

## 2014-09-22 NOTE — Progress Notes (Addendum)
12 Days Post-Op  Subjective: Having flatus, says no nausea, thinks family members are in room  Objective: Vital signs in last 24 hours: Temp:  [98 F (36.7 C)-98.7 F (37.1 C)] 98 F (36.7 C) (11/15 16100633) Pulse Rate:  [76-92] 80 (11/15 0633) Resp:  [14-22] 18 (11/15 0633) BP: (112-127)/(51-70) 124/70 mmHg (11/15 0633) SpO2:  [97 %-100 %] 100 % (11/15 96040633) Last BM Date: 09/21/14  Intake/Output from previous day: 11/14 0701 - 11/15 0700 In: 946.9 [I.V.:174.1; IV Piggyback:100; TPN:672.8] Out: 2925 [Urine:2850; Stool:75] Intake/Output this shift:    General appearance: no distress Resp: clear to auscultation bilaterally Cardio: regular rate and rhythm GI: incision apart soupy no active infection, ostomy pink and functional, bs present nontender, drain with really no output Neurologic: Grossly normal he is oriented but then talks about things not there  Lab Results:   Recent Labs  09/21/14 0405 09/22/14 0340  WBC 12.6* 14.4*  HGB 8.5* 8.6*  HCT 25.9* 26.4*  PLT 632* 808*   BMET  Recent Labs  09/20/14 0443 09/22/14 0340  NA 134* 137  K 4.3 4.3  CL 94* 98  CO2 30 28  GLUCOSE 127* 92  BUN 18 17  CREATININE 0.69 0.70  CALCIUM 8.5 8.7   PT/INR No results for input(s): LABPROT, INR in the last 72 hours. ABG No results for input(s): PHART, HCO3 in the last 72 hours.  Invalid input(s): PCO2, PO2  Studies/Results: No results found.  Anti-infectives: Anti-infectives    Start     Dose/Rate Route Frequency Ordered Stop   09/16/14 1630  piperacillin-tazobactam (ZOSYN) IVPB 3.375 g     3.375 g12.5 mL/hr over 240 Minutes Intravenous 3 times per day 09/16/14 1620     09/10/14 2200  cefoTEtan (CEFOTAN) 2 g in dextrose 5 % 50 mL IVPB     2 g100 mL/hr over 30 Minutes Intravenous Every 12 hours 09/10/14 1644 09/11/14 1130   09/10/14 0600  cefoTEtan (CEFOTAN) 2 g in dextrose 5 % 50 mL IVPB     2 g100 mL/hr over 30 Minutes Intravenous On call to O.R. 09/09/14 1321  09/10/14 1142      Assessment/Plan: POD 11 tac/ileostomy  1. Neuro- cont po tylenol, will stop pca as I think this may have something to do with mental status and start po meds, encourage non-narcotics 2. Cv/pulm- oob, ambulate, pulm toilet 3. GI- will try clears today rectal stump leak appears resolved from drain output 4. Id- cont abx, I think the area in mesentery is where I put a piece of surgicel snow. If wbc still up tomorrow would reimage, check ua also 5. Renal- no issues 6. Protonix, scds, not sure if he had any rectal bleeding, will continue lovenox as his hct is same 7. Wound is not healing but I did place interrupted sutures along with running, I don't think due to his tissue/low albumin etc there is anything else to do with this. Will increase dressing change frequency, may need some debridement as time goes on also   Addendum: did appear he may have had rectal bleeding, will change to sq heparin, hct same today, recheck in am, would like to keep him on some form of chemical proph as I think he is at high risk for VTE, had more bleeding, will hold vte prophylaxis for now and start canasa suppositories.  I think even with rectal stump leak there is little risk to a suppository and would like to get bleeding under control.  [facility] Center[facility]KE[facility],Ronald Mayo 09/22/2014

## 2014-09-22 NOTE — Progress Notes (Signed)
Patient ID: Ronald Mayo, male   DOB: 1941/01/02, 73 y.o.   MRN: [id number]  [facility] PROGRESS NOTE  Ronald Mayo [id number] DOB: 1941/01/02 DOA: 09/02/2014 PCP: Aniceto BossSETTLE,PAUL C, MD   Brief narrative: 73 year old male with ulcerative colitis on sulfasalazine, DM type II (on Amaryl), presented 10/26 with main concern of several weeks duration or persistent diarrhea and more recently blood in stool. This has been associated with poor oral intake, malaise, weight loss, subjective fevers, chills, intermittent abd cramping, non radiation, with no specific alleviating or aggravating factors. Ct abd and pelvis c/w active UC. 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 Dwain SarnaWakefield 11/03. Now with concern for post op stump leak, based on drainage from the JP/CT findings: 4.2 x 4.4 x 4.8 cm focal collection of debris and gas in the right central mesentery. Due to complex chronic medical conditions imposed on new acute complications noted below, [facility] Discussed with surgery team.   Assessment and Plan:    Impression/Recommendations Active Problems: Ulcerative colitis - with sigmoid stricture - on colonoscopy by Dr. Leone PayorGessner - 09/04/2014  - pt is now s/p total abdominal colectomy with end ileostomy by Dr. Dwain SarnaWakefield 11/03 - concern of post op stump leak, based on drainage from the JP/CT findings: 4.2 x 4.4 x 4.8 cm focal collection of debris and gas in the right central mesentery - continue Zosyn day #7 for mesenteric abscess  - appreciate surgery assistance  - NGT out but pt more confused this AM Acute encephalopathy - PCA Dilaudid stopped as it could contribute to confusion - provide analgesia, preferably non narcotic as needed  Acute on chronic blood loss anemia, UC with post op bleed - Hg trending down over the past 72 hours: 9.7 --> 9.2 --> 8.7 --> 8.8 --> 8.5 --> 8.6 - started on prophylactic Lovenox 11/4 but has some rectal  bleeding, Hg remains stable - pt is at high risk for DVT and agree with continuing subQ Heparin for now - close monitoring and repeat CBC in AM Atrial fib - rate controlled - not AC candidate due to acute issues, per cardiology - appreciate assistance of cardiologist  - will plan to convert to PO Metoprolol once pt able to take PO, possibly in AM Leukocytosis - secondary to the above, active problem - WBC trending up, will ask for CXR to make sure no developing PNA as pt has slight tachypnea this AM - continue Zosyn day #7, abd cultures pending  - CBC in AM DM type II - pt at home on Amaryl, on hold  - continue only SSI for now Protein-calorie malnutrition, severe  - secondary to progressive nature of UC and acute illness  - NGT out 11/4, pt stable and reports feeling better  Chronic venous insufficiency ulcerations, left medial malleolus, left heel. - Measurement: medial malleolar ulceration has healed. Left heel ulcer measures 0.5cm x 0.8cm, x 0.4cm - Wound EPP:IRJJObed:clean, pink, moist, mild drainage serous  - recommend regranex applied following cleanse with NS, and pat dry. This is topped with a soft silicone foam dressing - appreciate wound care team assistance  Ostomy  - RUQ ileostomy, 1 and 5/8 inches round, red, slightly budded - appreciate wound care team help  Acute functional quadriplegia - OOB to chair if pt able to tolerate   DVT prophylaxis  Heparin SQ, started 11/15  Code Status: Full Family Communication: No family at bedside  Disposition Plan: Remains inpatient   IV Access:  Peripheral IV Procedures and diagnostic studies:    Ct Abdomen Pelvis W Contrast 09/16/2014 4.2 x 4.4 x 4.8 cm focal collection of debris and gas in the right central mesentery, adjacent to the superior mesenteric vein. This particular location has a different appearance than any of the other fluid seen in the abdomen or pelvis and given the history of leukocytosis,  raises concern for evolving abscess, although it does not have a well-defined or enhancing rim at this time. Status post subtotal colectomy with right abdominal end ileostomy. No evidence for bowel obstruction. There is a small amount of ascites adjacent to the liver and in the right paracolic gutter and subtle peritoneal enhancement is seen along some of the fluid collections although they do not appear to be well organized at this time. A very small amount of intraperitoneal free air is associated with free air in the rectus sheath and anterior abdominal wall, not unexpected 6 days out from surgery.  Ct Abdomen Pelvis W Contrast 09/04/2014 Left-sided colitis, likely representing active ulcerative colitis. Infectious could look similar. Ischemia felt unlikely, given distribution and clinical history.Atherosclerosis, coronary arteries   Dg Chest Port 1 View 09/12/2014 PICC line tip in the right atrium.   Dg Chest Port 1 View 09/06/2014 Right PICC line placed, tip at the SVC level. No acute cardiopulmonary abnormality.  Medical Consultants:    Surgery  Cardiology   GI Other Consultants:    PT - recommend SNF upon discharge  OT - recommend SNF upon discharge  Wound care team  Anti-Infectives:    Zosyn 11/09 -->   Debbora PrestoMAGICK-Wilkie Zenon, MD  [facility] Pager [phone]  If 7PM-7AM, please contact night-coverage [email or web address] Password [email or web address] 09/22/2014, 11:36 AM   LOS: 20 days   HPI/Subjective: No events overnight.   Objective: Filed Vitals:   09/22/14 0119 09/22/14 0347 09/22/14 0502 09/22/14 0633  BP: 121/65   124/70  Pulse: 90   80  Temp:    98 F (36.7 C)  TempSrc:    Oral  Resp: 14 22 16 18   Height:      Weight:      SpO2:    100%    Intake/Output Summary (Last 24 hours) at 09/22/14 1136 Last data filed at 09/22/14 0938  Gross per 24 hour  Intake 906.94 ml  Output   2925 ml  Net -2018.06 ml    Exam:   General:  Pt is alert, follows some  commands appropriately, not in acute distress, confused slightly this AM  Cardiovascular: Irregular rate and rhythm, no rubs, no gallops  Respiratory: Clear to auscultation bilaterally, no wheezing, no crackles, no rhonchi  Abdomen: non tender, slightly distended, no guarding  Extremities: pulses DP and PT palpable bilaterally  Data Reviewed: Basic Metabolic Panel:  Recent Labs Lab 09/16/14 0403 09/17/14 0540 09/19/14 0520 09/20/14 0443 09/22/14 0340  NA 135* 135* 133* 134* 137  K 4.5 4.4 4.2 4.3 4.3  CL 95* 95* 93* 94* 98  CO2 32 31 30 30 28   GLUCOSE 146* 169* 138* 127* 92  BUN 16 16 16 18 17   CREATININE 0.59 0.60 0.66 0.69 0.70  CALCIUM 8.1* 8.3* 8.4 8.5 8.7  MG 2.0  --  2.2 2.1 2.1  PHOS 4.4  --  4.8* 3.9 4.3   Liver Function Tests:  Recent Labs Lab 09/16/14 0403 09/19/14 0520  AST 14 22  ALT 16 16  ALKPHOS 81 108  BILITOT 1.1 0.3  PROT 5.0* 5.9*  ALBUMIN 1.1* 1.4*  CBC:  Recent Labs Lab 09/16/14 0403  09/18/14 1400 09/19/14 0520 09/20/14 0443 09/21/14 0405 09/22/14 0340  WBC 23.3*  < > 16.6* 15.6* 16.9* 12.6* 14.4*  NEUTROABS 18.8*  --   --   --   --   --   --   HGB 9.7*  < > 8.2* 8.7* 8.8* 8.5* 8.6*  HCT 28.2*  < > 25.0* 26.4* 27.5* 25.9* 26.4*  MCV 90.7  < > 93.6 92.3 94.5 93.8 92.6  PLT 429*  < > 478* 558* 598* 632* 808*  < > = values in this interval not displayed.  CBG:  Recent Labs Lab 09/21/14 0425 09/21/14 0754 09/21/14 1156 09/21/14 2248 09/22/14 0555  GLUCAP 125* 102* 163* 122* 124*    Recent Results (from the past 240 hour(s))  Wound culture     Status: None   Collection Time: 09/18/14 10:14 AM  Result Value Ref Range Status   Specimen Description ABDOMEN  Final   Special Requests Immunocompromised  Final   Gram Stain   Final    ABUNDANT WBC PRESENT,BOTH PMN AND MONONUCLEAR NO SQUAMOUS EPITHELIAL CELLS SEEN FEW GRAM POSITIVE COCCI IN PAIRS Performed at [facility]    Culture   Final    MULTIPLE ORGANISMS  PRESENT, NONE PREDOMINANT Note: NO STAPHYLOCOCCUS AUREUS ISOLATED NO GROUP A STREP (S.PYOGENES) ISOLATED Performed at [facility]    Report Status 09/20/2014 FINAL  Final     Scheduled Meds: . acetaminophen  650 mg Oral TID  . becaplermin   Topical Q7 days  . citalopram  20 mg Oral Daily  . heparin subcutaneous  5,000 Units Subcutaneous 3 times per day  . insulin aspart  0-15 Units Subcutaneous 3 times per day  . lip balm  1 application Topical BID  . metoprolol  2.5 mg Intravenous 4 times per day  . pantoprazole (PROTONIX) IV  40 mg Intravenous Q24H  . piperacillin-tazobactam (ZOSYN)  IV  3.375 g Intravenous 3 times per day  . sodium chloride  10-40 mL Intracatheter Q12H   Continuous Infusions: . sodium chloride 10 mL/hr at 09/21/14 1954  . Marland KitchenTPN (CLINIMIX-E) Adult 100 mL/hr at 09/21/14 1954   And  . fat emulsion 250 mL (09/21/14 1954)  . Marland KitchenTPN (CLINIMIX-E) Adult     And  . fat emulsion

## 2014-09-22 NOTE — Progress Notes (Signed)
CNA reported to this writer that pt was "found on side of bed attempting to use urinal. Noted spots of bright red blood on floor at bedside as well as, in urinal and on pts inner thigh area and in pubic hairs around base of penis. CNA reported wiping rectal area gently to assess if bleeding was from the rectum and  cloth was completely clean" This writer emptied urinal at least 4 times prior to this occurrence and urine was clear yellow with no sign of bleeding.

## 2014-09-22 NOTE — Plan of Care (Signed)
Problem: Phase I Progression Outcomes Goal: Pain controlled with appropriate interventions Outcome: Completed/Met Date Met:  09/22/14 Goal: Tubes/drains patent Outcome: Completed/Met Date Met:  09/22/14 Goal: Voiding-avoid urinary catheter unless indicated Outcome: Completed/Met Date Met:  09/22/14

## 2014-09-22 NOTE — Progress Notes (Signed)
Pt has been restless and awake majority of pm shift 1900 to 0700. Talking to members of his family who are not present stating"thought they might be playing a trick on me" picking at the air, "seeing Hazle QuantFinches" and other bizarre behaviors.

## 2014-09-23 ENCOUNTER — Inpatient Hospital Stay (HOSPITAL_COMMUNITY): Payer: Medicare PPO

## 2014-09-23 ENCOUNTER — Encounter (HOSPITAL_COMMUNITY): Payer: Self-pay | Admitting: Radiology

## 2014-09-23 LAB — GLUCOSE, CAPILLARY
Glucose-Capillary: 130 mg/dL — ABNORMAL HIGH (ref 70–99)
Glucose-Capillary: 152 mg/dL — ABNORMAL HIGH (ref 70–99)
Glucose-Capillary: 170 mg/dL — ABNORMAL HIGH (ref 70–99)

## 2014-09-23 LAB — CBC
HCT: 26.1 % — ABNORMAL LOW (ref 39.0–52.0)
Hemoglobin: 8.6 g/dL — ABNORMAL LOW (ref 13.0–17.0)
MCH: 30.8 pg (ref 26.0–34.0)
MCHC: 33 g/dL (ref 30.0–36.0)
MCV: 93.5 fL (ref 78.0–100.0)
PLATELETS: 839 10*3/uL — AB (ref 150–400)
RBC: 2.79 MIL/uL — ABNORMAL LOW (ref 4.22–5.81)
RDW: 14.5 % (ref 11.5–15.5)
WBC: 16.9 10*3/uL — ABNORMAL HIGH (ref 4.0–10.5)

## 2014-09-23 LAB — COMPREHENSIVE METABOLIC PANEL
ALT: 20 U/L (ref 0–53)
ANION GAP: 11 (ref 5–15)
AST: 21 U/L (ref 0–37)
Albumin: 1.8 g/dL — ABNORMAL LOW (ref 3.5–5.2)
Alkaline Phosphatase: 114 U/L (ref 39–117)
BUN: 16 mg/dL (ref 6–23)
CO2: 26 mEq/L (ref 19–32)
Calcium: 9 mg/dL (ref 8.4–10.5)
Chloride: 100 mEq/L (ref 96–112)
Creatinine, Ser: 0.79 mg/dL (ref 0.50–1.35)
GFR calc non Af Amer: 88 mL/min — ABNORMAL LOW (ref >90)
GLUCOSE: 130 mg/dL — AB (ref 70–99)
Potassium: 4.2 mEq/L (ref 3.7–5.3)
Sodium: 137 mEq/L (ref 137–147)
TOTAL PROTEIN: 6.4 g/dL (ref 6.0–8.3)
Total Bilirubin: 0.3 mg/dL (ref 0.3–1.2)

## 2014-09-23 LAB — DIFFERENTIAL
BASOS ABS: 0.2 10*3/uL — AB (ref 0.0–0.1)
Basophils Relative: 1 % (ref 0–1)
EOS ABS: 0.5 10*3/uL (ref 0.0–0.7)
Eosinophils Relative: 3 % (ref 0–5)
LYMPHS ABS: 1.9 10*3/uL (ref 0.7–4.0)
Lymphocytes Relative: 11 % — ABNORMAL LOW (ref 12–46)
MONO ABS: 1.9 10*3/uL — AB (ref 0.1–1.0)
Monocytes Relative: 11 % (ref 3–12)
Neutro Abs: 12.4 10*3/uL — ABNORMAL HIGH (ref 1.7–7.7)
Neutrophils Relative %: 74 % (ref 43–77)

## 2014-09-23 LAB — TRIGLYCERIDES: Triglycerides: 101 mg/dL (ref <150)

## 2014-09-23 LAB — PREALBUMIN: Prealbumin: 15.5 mg/dL — ABNORMAL LOW (ref 17.0–34.0)

## 2014-09-23 LAB — MAGNESIUM: Magnesium: 2.1 mg/dL (ref 1.5–2.5)

## 2014-09-23 LAB — PHOSPHORUS: Phosphorus: 4 mg/dL (ref 2.3–4.6)

## 2014-09-23 MED ORDER — TRACE MINERALS CR-CU-F-FE-I-MN-MO-SE-ZN IV SOLN
INTRAVENOUS | Status: AC
  Administered 2014-09-23: 17:00:00 via INTRAVENOUS
  Filled 2014-09-23: qty 3000

## 2014-09-23 MED ORDER — IOHEXOL 300 MG/ML  SOLN
100.0000 mL | Freq: Once | INTRAMUSCULAR | Status: AC | PRN
  Administered 2014-09-23: 100 mL via INTRAVENOUS

## 2014-09-23 MED ORDER — FAT EMULSION 20 % IV EMUL
250.0000 mL | INTRAVENOUS | Status: AC
  Administered 2014-09-23: 250 mL via INTRAVENOUS
  Filled 2014-09-23: qty 250

## 2014-09-23 MED ORDER — PROMETHAZINE HCL 25 MG/ML IJ SOLN: 12.5000 mg | INTRAMUSCULAR | Status: DC | PRN

## 2014-09-23 MED ORDER — IOHEXOL 300 MG/ML  SOLN
50.0000 mL | Freq: Once | INTRAMUSCULAR | Status: AC | PRN
  Administered 2014-09-23: 50 mL via ORAL

## 2014-09-23 NOTE — Progress Notes (Signed)
NUTRITION FOLLOW UP  Intervention:   -TPN per pharmacy; currently at goal rate, monitor CBG's (within goal) -Diet advancement per MD; will monitor supplement and diet education needs -RD to continue to follow  Nutrition Dx:   Inadequate oral intake related to nausea/abd pain as evidenced by PO intake < 75% for 2 months, 30 lb weight loss in 3 months; ongoing  Goal:   Pt to meet >/= 90% of their estimated nutrition needs; being met  Monitor:   TPN tolerance, PO intake, diet order, labs, weights, GI profile  Assessment:   11/16 Pt receiving clear liquids. PO intake poor. Pt is refusing liquids at this time d/t altered mental status. Pt did have some clear liquids on 11/15. Per pharmacy, hesitant to decrease TPN d/t altered mental status.  NG tube removed 11/14 CBGs within goal range <150 CT scheduled for today  11/13:  Patient remains NPO. Patient asleep at time of visit with NG tube clamped. He continues to receive TPN at goal rate: Clinimix 5/15 at goal rate of 100 ml/hr with 20% lipids at 10 mlhr, providing 2184 kcal (100% est kcal needs), and 120 gram protein (100% est protein needs). His weight has trended up some with a 5 lbs weight gain in the past 2 weeks. Per MD note, may be able to remove NG tube later today.  Labs: glucose ranging 116 to 141 mg/dL, low hemoglobin, low sodium  11/06: -Clinimix 5/15 at goal rate of 100 ml/hr with 20% lipids at 10 mlhr, providing 2184 kcal (100% est kcal needs), and 120 gram protein (100% est protein needs) -Discussed pt with pharm on 11/05 re insulin modification and blood glucose control. RD in agreement with PharmD that pt would likely benefit from regular insulin being added in TPN with reduction in lantus dose -CBG currently at goal, < 150 mg/dL -Surgery recommended to continue NPO status; pt with some abd pain -WOC eval on 11/05 for venous stasis ulcers on heel and left lower leg, and provided ostomy care  Height: Ht Readings from Last  1 Encounters:  09/10/14 6' (1.829 m)    Weight Status:   Wt Readings from Last 1 Encounters:  09/17/14 229 lb 8 oz (104.1 kg)  09/10/14 224 lb 09/02/14 216 lb  Re-estimated needs:  Kcal: 2200-2400  Protein: 115-130 gram  Fluid: >/= 2200 ml daily    Skin:+1 LLE edema; wound on left heel; closed incision on abdomen  Diet Order: Diet clear liquid TPN (CLINIMIX-E) Adult TPN (CLINIMIX-E) Adult    Intake/Output Summary (Last 24 hours) at 09/23/14 1240 Last data filed at 09/23/14 1000  Gross per 24 hour  Intake   2970 ml  Output   3657 ml  Net   -687 ml    Last BM: 11/15 via ileostomy  Labs:   Recent Labs Lab 09/20/14 0443 09/22/14 0340 09/23/14 0509  NA 134* 137 137  K 4.3 4.3 4.2  CL 94* 98 100  CO2 '30 28 26  ' BUN '18 17 16  ' CREATININE 0.69 0.70 0.79  CALCIUM 8.5 8.7 9.0  MG 2.1 2.1 2.1  PHOS 3.9 4.3 4.0  GLUCOSE 127* 92 130*    CBG (last 3)   Recent Labs  09/22/14 1523 09/22/14 2152 09/23/14 0509  GLUCAP 142* 146* 130*    Scheduled Meds: . acetaminophen  650 mg Oral TID  . becaplermin   Topical Q7 days  . citalopram  20 mg Oral Daily  . heparin subcutaneous  5,000 Units Subcutaneous 3 times  per day  . insulin aspart  0-15 Units Subcutaneous 3 times per day  . lip balm  1 application Topical BID  . mesalamine  1,000 mg Rectal QHS  . metoprolol  2.5 mg Intravenous 4 times per day  . pantoprazole (PROTONIX) IV  40 mg Intravenous Q24H  . piperacillin-tazobactam (ZOSYN)  IV  3.375 g Intravenous 3 times per day  . sodium chloride  10-40 mL Intracatheter Q12H    Continuous Infusions: . sodium chloride 10 mL/hr at 09/21/14 1954  . Marland KitchenTPN (CLINIMIX-E) Adult 100 mL/hr at 09/22/14 1710   And  . fat emulsion 250 mL (09/22/14 1709)  . Marland KitchenTPN (CLINIMIX-E) Adult     And  . fat emulsion      Clayton Bibles, MS, RD, LDN Pager: [phone] After Hours Pager: [phone]

## 2014-09-23 NOTE — Consult Note (Signed)
WOC ostomy consult note Stoma type/location: RUQ\.  Patient has had two pouch changes in less than 24 hours; he is removing them, opening the closure feature but also on occasion they are coming off.  It is noted that the stoma is in close approximation to the midline wound at the medial edge. Stomal assessment/size: 1 and 5/8 inches sightly oval at rest.  With mild traction at 12 o'clock, stoma is round. Peristomal assessment: intact, clear Treatment options for stomal/peristomal skin: Skin barrier ring and convex pouching system Output brown stool Ostomy pouching: 1pc.convex pouching system with skin barrier ring.  Pouch # [id number], skin barrier ring [id number]. Education provided: None.  Patient is confused and disoriented, going for CT today.  Has a Recruitment consultantsafety sitter. WOC nursing team will not follow, and will remain available to this patient, the nursing and medical team.  Thanks, Ladona MowLaurie Orris Perin, MSN, RN, GNP, SmolanWOCN, CWON-AP [phone])

## 2014-09-23 NOTE — Progress Notes (Signed)
Pt has been very confused during the night. Pt knows where he is and why he is here but is seeing things that are not there. Pt is reaching for things in the air that are not there. Pt has been restless and trying to mess with some tubes at times. Bed alarm is on.

## 2014-09-23 NOTE — Progress Notes (Signed)
Nurse called pt's daughter, Ronald Mayo to let her know of pt's status/confusion.  Ronald Mayo stated that she was aware of her father's confusion and noticed that it started on Friday when she came to visit.  Ronald Mayo also stated that she had came to visit with her father on yesterday and noticed that he was restless and more confused. Pt's family was told by MD and nurse that the pain medication he was taking could be the cause of this confusion.  Pt currently has a Recruitment consultantsafety sitter at bedside to keep him from pulling out tubes and drains.

## 2014-09-23 NOTE — Progress Notes (Signed)
PARENTERAL NUTRITION CONSULT NOTE - Follow Up  Pharmacy Consult for TPN Indication: Severe ulcerative colitis and malnutrition  No Known Allergies  Patient Measurements: Height: 6' (182.9 cm) Weight: 229 lb 8 oz (104.1 kg) IBW/kg (Calculated) : 77.6 Usual Weight: ~ 246lbs (30 lb wt loss over 3 months)  Vital Signs: Temp: 98.7 F (37.1 C) (11/16 0505) Temp Source: Oral (11/16 0505) BP: 129/59 mmHg (11/16 0752) Pulse Rate: 68 (11/16 0752) Intake/Output from previous day: 11/15 0701 - 11/16 0700 In: 3500 [P.O.:480; I.V.:280; IV Piggyback:100; TPN:2640] Out: 3638 [Urine:3050; Drains:13; Stool:575] Intake/Output from this shift: Total I/O In: 0  Out: 320 [Urine:170; Stool:150] Labs:  Recent Labs  09/22/14 0340 09/22/14 1435 09/23/14 0509  WBC 14.4* 15.7* 16.9*  HGB 8.6* 8.5* 8.6*  HCT 26.4* 26.6* 26.1*  PLT 808* 819* 839*    Recent Labs  09/22/14 0340 09/23/14 0509  NA 137 137  K 4.3 4.2  CL 98 100  CO2 28 26  GLUCOSE 92 130*  BUN 17 16  CREATININE 0.70 0.79  CALCIUM 8.7 9.0  MG 2.1 2.1  PHOS 4.3 4.0  PROT  --  6.4  ALBUMIN  --  1.8*  AST  --  21  ALT  --  20  ALKPHOS  --  114  BILITOT  --  0.3  TRIG  --  101   Estimated Creatinine Clearance: 104.1 mL/min (by C-G formula based on Cr of 0.79).    Recent Labs  09/22/14 1523 09/22/14 2152 09/23/14 0509  GLUCAP 142* 146* 130*   Insulin Requirements:  6 units/24 hr: SSI moderate q4h  Current Nutrition: CL diet  IVF: NS at 1810ml/hr   Central access:  PICC placed 10/30 TPN start date: 10/30  ASSESSMENT HPI: [id number] YOM with known h/o ulcerative colitis.  Colonoscopy reveals severe UC and proctitis with friable strictures.  Biopsies consistent with UC and not malignancy. General surgery consulted and plan is for proctocolectomy and J-pouch.  Pharmacy asked to start TPN to improve nutritional status for surgery. History of diabetes on glimepiride prior to admission, glimepiride held, using Novolog  correction scale  Significant events:  10/30: TPN started at 40 ml/hr 11/1: advanced TPN to 60 m/hr, removed regular insulin from TPN and added Lantus BID 11/3: total colectomy with ileostomy 11/5: TPN at goal of 14200ml/hr, lantus dose decreased and regular insulin added to TPN 11/11: Lantus discontinued 11/12: Electrolytes removed from TNA due to elevated Phos 11/13: Electrolytes wnl so electrolytes added back to TNA. NG clamping trials. 11/14: NG dc'd. 11/16: CL diet from 11/15, good intake dinner 11/15, ~ no intake 11/16 breakfast-states not hungry. Concern with AMS changes   11/15:  Glucose - CBGs at goal < 150.  Electrolytes - wnl. CorrCa 10.8.   Renal - SCr WNL  LFTs - WNL(11/12)  TGs - 96 (11/2), 144 (11/9) 101 (11/16)  Prealbumin - 12.7 (10/30), 10.3 (11/2), 5 (11/9)  WBC remains elevated 16.9K  NUTRITIONAL GOALS RD recs: 2200-2400 kcals, 115-130gm protein Clinimix 5/15 at a goal rate of 16200ml/hr + 20% fat emulsion at 5310ml/hr to provide: 120g/day protein, 2184Kcal/day.  PLAN   Cont Clinimix E 5/15 at goal rate of 13000ml/hr.  Continue with regular insulin 75 units in 3L TPN bag (25 units/L) - to deliver 60 units regular insulin/24h.  20% fat emulsion at 3810ml/hr.  TPN to contain standard multivitamins and trace elements.  [facility]  Continue moderate SSI q8h.  TPN lab panels on Mondays & Thursdays.  F/u daily.  Otho BellowsGreen, Lyndell Gillyard L PharmD Pager [phone] 09/23/2014, 10:53 AM.

## 2014-09-23 NOTE — Progress Notes (Signed)
OT Cancellation Note  Patient Details Name: Lorenso QuarryWilliam M Anderle MRN: [id number] DOB: Jan 24, 1941   Cancelled Treatment:    Reason Eval/Treat Not Completed: Other (comment) Per nursing and chart, pt is hallucinating this am. Nursing states tech assisted pt up to chair and he was restless and needed to be assisted back to bed for safety. Will try back on pt another day.  Lennox LaityStone, Mihran Lebarron Stafford  [phone] 09/23/2014, 10:34 AM

## 2014-09-23 NOTE — Progress Notes (Signed)
Patient ID: Lorenso QuarryWilliam M Capurro, male   DOB: 10-24-1941, 73 y.o.   MRN: [id number]  [facility] PROGRESS NOTE  Lorenso QuarryWilliam M Jerez WUJ:[id number] DOB: 10-24-1941 DOA: 09/02/2014 PCP: Aniceto BossSETTLE,PAUL C, MD  Brief narrative: 73 year old male with ulcerative colitis on sulfasalazine, DM type II (on Amaryl), presented 10/26 with main concern of several weeks duration or persistent diarrhea and more recently blood in stool. This has been associated with poor oral intake, malaise, weight loss, subjective fevers, chills, intermittent abd cramping, non radiation, with no specific alleviating or aggravating factors. Ct abd and pelvis c/w active UC. 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 Dwain SarnaWakefield 11/03. Now with concern for post op stump leak, based on drainage from the JP/CT findings: 4.2 x 4.4 x 4.8 cm focal collection of debris and gas in the right central mesentery. Due to complex chronic medical conditions imposed on new acute complications noted below, [facility]. Discussed with surgery team.   Assessment and Plan:    Impression/Recommendations Active Problems: Ulcerative colitis - with sigmoid stricture - on colonoscopy by Dr. Leone PayorGessner - 09/04/2014  - pt is now s/p total abdominal colectomy with end ileostomy by Dr. Dwain SarnaWakefield 11/03 - concern of post op stump leak, based on drainage from the JP/CT findings: 4.2 x 4.4 x 4.8 cm focal collection of debris and gas in the right central mesentery - continue Zosyn day #8 for mesenteric abscess  - repeat CT abd pending 11/16, wound not healing well, management per surgical team  - appreciate surgery assistance  - NGT out but pt more confused this AM Acute encephalopathy  - worse with AM 11/16, pt more agitated  - PCA Dilaudid stopped 11/15 as it could contribute to confusion - provide analgesia, preferably non narcotic as needed  - sitter at bedside  Acute on chronic blood loss anemia, UC with post  op bleed - Hg trending down over the past 72 hours: 9.7 --> 9.2 --> 8.7 --> 8.8 --> 8.5 --> 8.6 - started on prophylactic Lovenox 11/14, changed to Heparin SQ 11/15 - pt is at high risk for DVT  Atrial fib - rate controlled - not AC candidate due to acute issues, per cardiology - appreciate assistance of cardiologist  - will plan to convert to PO Metoprolol once pt able to take PO, possibly in AM Leukocytosis - secondary to the above, active problem and worse 11/16 - CT abd per surgery, pending  - continue Zosyn day #8, abd cultures pending  - CBC in AM DM type II - pt at home on Amaryl, on hold  - continue only SSI for now Protein-calorie malnutrition, severe  - secondary to progressive nature of UC and acute illness  - NGT out 11/4, pt stable and reports feeling better  Chronic venous insufficiency ulcerations, left medial malleolus, left heel. - Measurement: medial malleolar ulceration has healed. Left heel ulcer measures 0.5cm x 0.8cm, x 0.4cm - Wound NFA:OZHYQbed:clean, pink, moist, mild drainage serous  - recommend regranex applied following cleanse with NS, and pat dry. This is topped with a soft silicone foam dressing - appreciate wound care team assistance  Ostomy  - RUQ ileostomy, 1 and 5/8 inches round, red, slightly budded - appreciate wound care team help  Acute functional quadriplegia - very weak  DVT prophylaxis  Heparin SQ, started 11/15  Code Status: Full Family Communication: No family at bedside  Disposition Plan: Remains inpatient   IV Access:    Peripheral IV Procedures and  diagnostic studies:    Ct Abdomen Pelvis W Contrast 09/16/2014 4.2 x 4.4 x 4.8 cm focal collection of debris and gas in the right central mesentery, adjacent to the superior mesenteric vein. This particular location has a different appearance than any of the other fluid seen in the abdomen or pelvis and given the history of leukocytosis, raises concern for evolving  abscess, although it does not have a well-defined or enhancing rim at this time. Status post subtotal colectomy with right abdominal end ileostomy. No evidence for bowel obstruction. There is a small amount of ascites adjacent to the liver and in the right paracolic gutter and subtle peritoneal enhancement is seen along some of the fluid collections although they do not appear to be well organized at this time. A very small amount of intraperitoneal free air is associated with free air in the rectus sheath and anterior abdominal wall, not unexpected 6 days out from surgery.  Ct Abdomen Pelvis W Contrast 09/04/2014 Left-sided colitis, likely representing active ulcerative colitis. Infectious could look similar. Ischemia felt unlikely, given distribution and clinical history.Atherosclerosis, coronary arteries   Dg Chest Port 1 View 09/12/2014 PICC line tip in the right atrium.   Dg Chest Port 1 View 09/06/2014 Right PICC line placed, tip at the SVC level. No acute cardiopulmonary abnormality.  Medical Consultants:    Surgery  Cardiology   GI Other Consultants:    PT - recommend SNF upon discharge  OT - recommend SNF upon discharge  Wound care team  Anti-Infectives:    Zosyn 11/09 -->  Debbora PrestoMAGICK-MYERS, ISKRA, MD  [facility] Pager [phone]  If 7PM-7AM, please contact night-coverage [email or web address] Password [email or web address] 09/23/2014, 8:36 PM   LOS: 21 days   HPI/Subjective: No events overnight.   Objective: Filed Vitals:   09/22/14 2150 09/23/14 0505 09/23/14 0752 09/23/14 1428  BP: 118/54 145/65 129/59 109/68  Pulse: 93 94 68 78  Temp: 98 F (36.7 C) 98.7 F (37.1 C)    TempSrc: Oral Oral    Resp: 18 18    Height:      Weight:      SpO2: 97% 96%  97%    Intake/Output Summary (Last 24 hours) at 09/23/14 2036 Last data filed at 09/23/14 1839  Gross per 24 hour  Intake   3290 ml  Output   3210 ml  Net     80 ml    Exam:   General:  Pt is confused,  agitated, not following any commands   Cardiovascular: Regular rate and rhythm, no rubs, no gallops  Respiratory: Clear to auscultation bilaterally, no wheezing, poor air movement at bases   Abdomen: open abd wound  Extremities: pulses DP and PT palpable bilaterally  Data Reviewed: Basic Metabolic Panel:  Recent Labs Lab 09/17/14 0540 09/19/14 0520 09/20/14 0443 09/22/14 0340 09/23/14 0509  NA 135* 133* 134* 137 137  K 4.4 4.2 4.3 4.3 4.2  CL 95* 93* 94* 98 100  CO2 31 30 30 28 26   GLUCOSE 169* 138* 127* 92 130*  BUN 16 16 18 17 16   CREATININE 0.60 0.66 0.69 0.70 0.79  CALCIUM 8.3* 8.4 8.5 8.7 9.0  MG  --  2.2 2.1 2.1 2.1  PHOS  --  4.8* 3.9 4.3 4.0   Liver Function Tests:  Recent Labs Lab 09/19/14 0520 09/23/14 0509  AST 22 21  ALT 16 20  ALKPHOS 108 114  BILITOT 0.3 0.3  PROT 5.9* 6.4  ALBUMIN 1.4* 1.8*   No results for  input(s): LIPASE, AMYLASE in the last 168 hours. No results for input(s): AMMONIA in the last 168 hours. CBC:  Recent Labs Lab 09/20/14 0443 09/21/14 0405 09/22/14 0340 09/22/14 1435 09/23/14 0509  WBC 16.9* 12.6* 14.4* 15.7* 16.9*  NEUTROABS  --   --   --   --  12.4*  HGB 8.8* 8.5* 8.6* 8.5* 8.6*  HCT 27.5* 25.9* 26.4* 26.6* 26.1*  MCV 94.5 93.8 92.6 94.0 93.5  PLT 598* 632* 808* 819* 839*   Cardiac Enzymes: No results for input(s): CKTOTAL, CKMB, CKMBINDEX, TROPONINI in the last 168 hours. BNP: Invalid input(s): POCBNP CBG:  Recent Labs Lab 09/22/14 0555 09/22/14 1523 09/22/14 2152 09/23/14 0509 09/23/14 1426  GLUCAP 124* 142* 146* 130* 152*    Recent Results (from the past 240 hour(s))  Wound culture     Status: None   Collection Time: 09/18/14 10:14 AM  Result Value Ref Range Status   Specimen Description ABDOMEN  Final   Special Requests Immunocompromised  Final   Gram Stain   Final    ABUNDANT WBC PRESENT,BOTH PMN AND MONONUCLEAR NO SQUAMOUS EPITHELIAL CELLS SEEN FEW GRAM POSITIVE COCCI IN PAIRS Performed  at [facility]    Culture   Final    MULTIPLE ORGANISMS PRESENT, NONE PREDOMINANT Note: NO STAPHYLOCOCCUS AUREUS ISOLATED NO GROUP A STREP (S.PYOGENES) ISOLATED Performed at [facility]    Report Status 09/20/2014 FINAL  Final     Scheduled Meds: . acetaminophen  650 mg Oral TID  . becaplermin   Topical Q7 days  . citalopram  20 mg Oral Daily  . heparin subcutaneous  5,000 Units Subcutaneous 3 times per day  . insulin aspart  0-15 Units Subcutaneous 3 times per day  . lip balm  1 application Topical BID  . mesalamine  1,000 mg Rectal QHS  . metoprolol  2.5 mg Intravenous 4 times per day  . pantoprazole (PROTONIX) IV  40 mg Intravenous Q24H  . piperacillin-tazobactam (ZOSYN)  IV  3.375 g Intravenous 3 times per day  . sodium chloride  10-40 mL Intracatheter Q12H   Continuous Infusions: . sodium chloride 10 mL/hr at 09/21/14 1954  . Marland KitchenTPN (CLINIMIX-E) Adult 100 mL/hr at 09/23/14 1706   And  . fat emulsion 250 mL (09/23/14 1706)

## 2014-09-23 NOTE — Plan of Care (Signed)
Problem: Phase I Progression Outcomes Goal: Vital signs/hemodynamically stable Outcome: Completed/Met Date Met:  09/23/14  Problem: Phase II Progression Outcomes Goal: Return of bowel function (flatus, BM) IF ABDOMINAL SURGERY:  Outcome: Completed/Met Date Met:  09/23/14 Goal: Foley discontinued Outcome: Not Applicable Date Met:  72/55/00

## 2014-09-23 NOTE — Progress Notes (Signed)
13 Days Post-Op  Subjective: Mentation much worse.  He has a Comptrollersitter, wound continues to melt.  WBC no better.  Objective: Vital signs in last 24 hours: Temp:  [98 F (36.7 C)-98.7 F (37.1 C)] 98.7 F (37.1 C) (11/16 0505) Pulse Rate:  [68-94] 68 (11/16 0752) Resp:  [18] 18 (11/16 0505) BP: (118-145)/(54-65) 129/59 mmHg (11/16 0752) SpO2:  [96 %-97 %] 96 % (11/16 0505) Last BM Date: 09/22/14 480 PO Afebrile, VSS WBC still up to 16.9 H/H is stable Last CT 09/16/14 Intake/Output from previous day: 11/15 0701 - 11/16 0700 In: 3500 [P.O.:480; I.V.:280; IV Piggyback:100; TPN:2640] Out: 3638 [Urine:3050; Drains:13; Stool:575] Intake/Output this shift: Total I/O In: 480 [I.V.:40; TPN:440] Out: 320 [Urine:170; Stool:150]  General appearance: confused, lethargic, barely responsive, he won't eat or drink anything. Resp: clear to auscultation bilaterally GI: Wound continues to look terrible,Ostomy looks good with normal ileostomy colored drainage now.  Lab Results:   Recent Labs  09/22/14 1435 09/23/14 0509  WBC 15.7* 16.9*  HGB 8.5* 8.6*  HCT 26.6* 26.1*  PLT 819* 839*    BMET  Recent Labs  09/22/14 0340 09/23/14 0509  NA 137 137  K 4.3 4.2  CL 98 100  CO2 28 26  GLUCOSE 92 130*  BUN 17 16  CREATININE 0.70 0.79  CALCIUM 8.7 9.0   PT/INR No results for input(s): LABPROT, INR in the last 72 hours.   Recent Labs Lab 09/19/14 0520 09/23/14 0509  AST 22 21  ALT 16 20  ALKPHOS 108 114  BILITOT 0.3 0.3  PROT 5.9* 6.4  ALBUMIN 1.4* 1.8*     Lipase     Component Value Date/Time   LIPASE 22.0 03/03/2007 0941     Studies/Results: Dg Chest Port 1 View  09/22/2014   CLINICAL DATA:  Leukocytosis. Ulcerative colitis. Coronary artery disease.  EXAM: PORTABLE CHEST - 1 VIEW  COMPARISON:  09/19/2014  FINDINGS: Removal of nasogastric tube. A right-sided PICC line terminates at the mid to low SVC.  Mild cardiomegaly. Mild right hemidiaphragm elevation. No pleural  fluid. Hyperinflation with chronic interstitial thickening. No lobar consolidation. No congestive failure.  IMPRESSION: Cardiomegaly with COPD/chronic bronchitis. No acute superimposed process.   Electronically Signed   By: Jeronimo GreavesKyle  Talbot M.D.   On: 09/22/2014 14:47    Medications: . acetaminophen  650 mg Oral TID  . becaplermin   Topical Q7 days  . citalopram  20 mg Oral Daily  . heparin subcutaneous  5,000 Units Subcutaneous 3 times per day  . insulin aspart  0-15 Units Subcutaneous 3 times per day  . lip balm  1 application Topical BID  . mesalamine  1,000 mg Rectal QHS  . metoprolol  2.5 mg Intravenous 4 times per day  . pantoprazole (PROTONIX) IV  40 mg Intravenous Q24H  . piperacillin-tazobactam (ZOSYN)  IV  3.375 g Intravenous 3 times per day  . sodium chloride  10-40 mL Intracatheter Q12H  canasa suppositories . sodium chloride 10 mL/hr at 09/21/14 1954  . Marland Kitchen.TPN (CLINIMIX-E) Adult 100 mL/hr at 09/22/14 1710   And  . fat emulsion 250 mL (09/22/14 1709)  . Marland Kitchen.TPN (CLINIMIX-E) Adult     And  . fat emulsion     alum & mag hydroxide-simeth, HYDROmorphone (DILAUDID) injection, magic mouthwash, menthol-cetylpyridinium, ondansetron (ZOFRAN) IV, oxyCODONE, phenol, sodium chloride  Assessment/Plan 1. 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 Leone PayorGessner - 09/04/2014 [note that this  stricture was documented in 2011 on a colonoscopy attempt.  S/p Total abdominal colectomy with end ileostomy, Dr. Harden MoMatt Wakefield, 09/10/14.  Concern for stump leak, based on drainage from the JP/CT findings: 4.2 x 4.4 x  4.8 cm focal collection of debris and gas in the right central mesentery 2. Anemia - Hgb - 10/6 - 09/07/2014 transfused 09/14/14 3. Malnutrition - Prealbumin- 12.7 On TNA 4. DM 5. CAD 6. Chronic History of A. Fib rate in the 80's. 7. Very hard of hearing 8. DVT prophylaxis - on no chemoprophylaxis due to UC 9. Rectal bleeding on heparin  last 11/10-11/15 (Heparin is off again for rectal bleeding)   Plan:  I am going to repeat his CT.   CT shows new 2.2 cm abscess deep small abscess measuring about 2.2 cm located at the junction of the deep subcutaneous soft tissues and underlying fascia along the caudal aspect of postsurgical wound anterior abdominal wall. 42 x 44 mm fluid collection right abdominal mesentary, not significantly changed from last CT on 09/16/14.  Will review with Dr. Daphine DeutscherMartin.  LOS: 21 days    Rosalio Catterton 09/23/2014

## 2014-09-24 LAB — BASIC METABOLIC PANEL
Anion gap: 14 (ref 5–15)
BUN: 19 mg/dL (ref 6–23)
CO2: 23 mEq/L (ref 19–32)
CREATININE: 0.73 mg/dL (ref 0.50–1.35)
Calcium: 8.8 mg/dL (ref 8.4–10.5)
Chloride: 101 mEq/L (ref 96–112)
GFR calc Af Amer: >90 mL/min (ref >90)
GFR calc non Af Amer: >90 mL/min (ref >90)
Glucose, Bld: 145 mg/dL — ABNORMAL HIGH (ref 70–99)
Potassium: 4.3 mEq/L (ref 3.7–5.3)
Sodium: 138 mEq/L (ref 137–147)

## 2014-09-24 LAB — GLUCOSE, CAPILLARY
GLUCOSE-CAPILLARY: 154 mg/dL — AB (ref 70–99)
Glucose-Capillary: 164 mg/dL — ABNORMAL HIGH (ref 70–99)
Glucose-Capillary: 153 mg/dL — ABNORMAL HIGH (ref 70–99)

## 2014-09-24 LAB — CBC
HEMATOCRIT: 27.4 % — AB (ref 39.0–52.0)
HEMOGLOBIN: 8.8 g/dL — AB (ref 13.0–17.0)
MCH: 29.9 pg (ref 26.0–34.0)
MCHC: 32.1 g/dL (ref 30.0–36.0)
MCV: 93.2 fL (ref 78.0–100.0)
Platelets: 932 10*3/uL (ref 150–400)
RBC: 2.94 MIL/uL — ABNORMAL LOW (ref 4.22–5.81)
RDW: 15.1 % (ref 11.5–15.5)
WBC: 18.5 10*3/uL — AB (ref 4.0–10.5)

## 2014-09-24 LAB — PATHOLOGIST SMEAR REVIEW

## 2014-09-24 MED ORDER — DAKINS (1/4 STRENGTH) 0.125 % EX SOLN
Freq: Three times a day (TID) | CUTANEOUS | Status: DC
  Administered 2014-09-24 – 2014-09-26 (×7): via TOPICAL
  Filled 2014-09-24 (×2): qty 473

## 2014-09-24 MED ORDER — FAT EMULSION 20 % IV EMUL
250.0000 mL | INTRAVENOUS | Status: AC
  Administered 2014-09-24: 250 mL via INTRAVENOUS
  Filled 2014-09-24: qty 250

## 2014-09-24 MED ORDER — TRACE MINERALS CR-CU-F-FE-I-MN-MO-SE-ZN IV SOLN
INTRAVENOUS | Status: AC
  Administered 2014-09-24: 18:00:00 via INTRAVENOUS
  Filled 2014-09-24: qty 3000

## 2014-09-24 MED ORDER — PRO-STAT SUGAR FREE PO LIQD
30.0000 mL | Freq: Two times a day (BID) | ORAL | Status: DC
  Administered 2014-09-24 – 2014-09-26 (×3): 30 mL via ORAL
  Filled 2014-09-24 (×6): qty 30

## 2014-09-24 NOTE — Progress Notes (Signed)
14 Days Post-Op  Subjective: He was sleeping well, and was much clearer from a mentation standpoint.  Open wound about the same, I am going to just go to wet 4x4's in the wound again.   He is not aware of any further rectal bleeding. Objective: Vital signs in last 24 hours: Temp:  [98.2 F (36.8 C)] 98.2 F (36.8 C) (11/17 0537) Pulse Rate:  [74-78] 74 (11/17 0537) Resp:  [16-18] 18 (11/17 0537) BP: (109-129)/(56-68) 125/59 mmHg (11/17 0537) SpO2:  [97 %-100 %] 98 % (11/17 0537) Last BM Date: 09/22/14 925 from ileostomy 120 Po  Diet: clears Afebrile, VSS WBC continues to rise, anemia stable CT shows deep fluid collection not amenable to drain, New collection at deep subcutaneous/soft tissue junction, most likely open and drained Intake/Output from previous day: 11/16 0701 - 11/17 0700 In: 1970 [P.O.:120; I.V.:120; IV Piggyback:50; TPN:1680] Out: 2205 [Urine:1275; Drains:5; Stool:925] Intake/Output this shift:    General appearance: alert, cooperative and no distress Resp: clear to auscultation bilaterally and some rales in the bases GI: ileostomy looks good, clear fluid in bag.  Open wound about the same.  No better or worse.  + BS.  Lab Results:   Recent Labs  09/23/14 0509 09/24/14 0445  WBC 16.9* 18.5*  HGB 8.6* 8.8*  HCT 26.1* 27.4*  PLT 839* 932*    BMET  Recent Labs  09/23/14 0509 09/24/14 0445  NA 137 138  K 4.2 4.3  CL 100 101  CO2 26 23  GLUCOSE 130* 145*  BUN 16 19  CREATININE 0.79 0.73  CALCIUM 9.0 8.8   PT/INR No results for input(s): LABPROT, INR in the last 72 hours.   Recent Labs Lab 09/19/14 0520 09/23/14 0509  AST 22 21  ALT 16 20  ALKPHOS 108 114  BILITOT 0.3 0.3  PROT 5.9* 6.4  ALBUMIN 1.4* 1.8*     Lipase     Component Value Date/Time   LIPASE 22.0 03/03/2007 0941     Studies/Results: Ct Abdomen Pelvis W Contrast  09/23/2014   CLINICAL DATA:  Subsequent encounter for status post total colectomy and ileostomy on  01/07/2014, with increased white blood count and anemia  EXAM: CT ABDOMEN AND PELVIS WITH CONTRAST  TECHNIQUE: Multidetector CT imaging of the abdomen and pelvis was performed using the standard protocol following bolus administration of intravenous contrast.  CONTRAST:  50mL OMNIPAQUE IOHEXOL 300 MG/ML SOLN, 100mL OMNIPAQUE IOHEXOL 300 MG/ML SOLN  COMPARISON:  09/16/2014  FINDINGS: No acute musculoskeletal findings. Visualized portions of the lung bases clear.  Liver is normal. Gallbladder mildly distended but otherwise normal. Spleen and pancreas normal.  Adrenal glands normal. Numerous renal cysts stable. Calcification of the aorta stable.  Status post total colectomy. Right ileostomy. Extensive postsurgical wound in the midline. Deep to the caudal edge of the wound, there is a 22 x 22 mm rounded collection of gas and fluid consistent with a small abscess in the deep subcutaneous soft tissues/ anterior abdominal wall. There is a collection of fluid and gas with mild surrounding inflammatory change measuring 42 x 44 mm in the right abdomen mesentery, not significantly different from prior study.  Single small focus of gas in the nondependent portion of the bladder may be due to recent instrumentation. No other evidence to suggest that it is related to cystitis. Distal colon remnant noted. Percutaneous drainage catheter through the pelvis stable. Persistent but decreased overall peritoneal inflammatory change. Decreased pneumoperitoneum.  IMPRESSION: 1. Stable abscess in the right abdominal  mesentery 2. New small abscess measuring about 2.2 cm located at the junction of the deep subcutaneous soft tissues and underlying fascia along the caudal aspect of postsurgical wound anterior abdominal wall. 3. There is persistent but improved peritoneal inflammatory change.   Electronically Signed   By: Esperanza Heiraymond  Rubner M.D.   On: 09/23/2014 16:13   Dg Chest Port 1 View  09/22/2014   CLINICAL DATA:  Leukocytosis. Ulcerative  colitis. Coronary artery disease.  EXAM: PORTABLE CHEST - 1 VIEW  COMPARISON:  09/19/2014  FINDINGS: Removal of nasogastric tube. A right-sided PICC line terminates at the mid to low SVC.  Mild cardiomegaly. Mild right hemidiaphragm elevation. No pleural fluid. Hyperinflation with chronic interstitial thickening. No lobar consolidation. No congestive failure.  IMPRESSION: Cardiomegaly with COPD/chronic bronchitis. No acute superimposed process.   Electronically Signed   By: Jeronimo GreavesKyle  Talbot M.D.   On: 09/22/2014 14:47    Medications: . acetaminophen  650 mg Oral TID  . becaplermin   Topical Q7 days  . citalopram  20 mg Oral Daily  . heparin subcutaneous  5,000 Units Subcutaneous 3 times per day  . insulin aspart  0-15 Units Subcutaneous 3 times per day  . lip balm  1 application Topical BID  . mesalamine  1,000 mg Rectal QHS  . metoprolol  2.5 mg Intravenous 4 times per day  . pantoprazole (PROTONIX) IV  40 mg Intravenous Q24H  . piperacillin-tazobactam (ZOSYN)  IV  3.375 g Intravenous 3 times per day  . sodium chloride  10-40 mL Intracatheter Q12H    Assessment/Plan 1. 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 Leone PayorGessner - 09/04/2014 [note that this stricture was documented in 2011 on a colonoscopy attempt. S/p Total abdominal colectomy with end ileostomy, Dr. Harden MoMatt Wakefield, 09/10/14. Concern for stump leak, based on drainage from the JP/CT findings: 4.2 x 4.4 x 4.8 cm focal collection of debris and gas in the right central mesentery 2. Anemia - Hgb - 10/6 - 09/07/2014 transfused 09/14/14 3. Malnutrition - Prealbumin- 12.7 On TNA 4. DM 5. CAD 6. Chronic History of A. Fib rate in the 80's. 7. Very hard of hearing 8. DVT prophylaxis - on no chemoprophylaxis due to UC 9. Rectal bleeding on heparin last 11/10-11/15 (Heparin is off again for rectal bleeding)   Plan:  Advance diet, use dakin's  solution for the wound for a couple days.  I have ask Dietician to assist with PO's also.  Continue antibiotics.       LOS: 22 days    Soul Hackman 09/24/2014

## 2014-09-24 NOTE — Progress Notes (Signed)
PARENTERAL NUTRITION CONSULT NOTE - Follow Up  Pharmacy Consult for TPN Indication: Severe ulcerative colitis and malnutrition  No Known Allergies  Patient Measurements: Height: 6' (182.9 cm) Weight: 229 lb 8 oz (104.1 kg) IBW/kg (Calculated) : 77.6 Usual Weight: ~ 246lbs (30 lb wt loss over 3 months)  Vital Signs: Temp: 98.1 F (36.7 C) (11/17 1434) Temp Source: Oral (11/17 1434) BP: 121/61 mmHg (11/17 1434) Pulse Rate: 88 (11/17 1434) Intake/Output from previous day: 11/16 0701 - 11/17 0700 In: 1970 [P.O.:120; I.V.:120; IV Piggyback:50; TPN:1680] Out: 2205 [Urine:1275; Drains:5; Stool:925] Intake/Output from this shift: Total I/O In: 200 [P.O.:200] Out: 300 [Urine:300] Labs:  Recent Labs  09/22/14 1435 09/23/14 0509 09/24/14 0445  WBC 15.7* 16.9* 18.5*  HGB 8.5* 8.6* 8.8*  HCT 26.6* 26.1* 27.4*  PLT 819* 839* 932*    Recent Labs  09/22/14 0340 09/23/14 0509 09/24/14 0445  NA 137 137 138  K 4.3 4.2 4.3  CL 98 100 101  CO2 28 26 23   GLUCOSE 92 130* 145*  BUN 17 16 19   CREATININE 0.70 0.79 0.73  CALCIUM 8.7 9.0 8.8  MG 2.1 2.1  --   PHOS 4.3 4.0  --   PROT  --  6.4  --   ALBUMIN  --  1.8*  --   AST  --  21  --   ALT  --  20  --   ALKPHOS  --  114  --   BILITOT  --  0.3  --   PREALBUMIN  --  15.5*  --   TRIG  --  101  --    Estimated Creatinine Clearance: 104.1 mL/min (by C-G formula based on Cr of 0.73).    Recent Labs  09/23/14 1426 09/23/14 2153 09/24/14 0532  GLUCAP 152* 170* 154*   Insulin Requirements:  9 units/24 hr: SSI moderate q8h  Current Nutrition: diet advanced to soft Ate 2 popsicles this am, requested cream soup, but stomach upset, would attempt later in the day  IVF: NS at 6310ml/hr   Central access:  PICC placed 10/30 TPN start date: 10/30  ASSESSMENT HPI: [age] YOM with known h/o ulcerative colitis.  Colonoscopy reveals severe UC and proctitis with friable strictures.  Biopsies consistent with UC and not malignancy.  General surgery consulted and plan is for proctocolectomy and J-pouch.  Pharmacy asked to start TPN to improve nutritional status for surgery. History of diabetes on glimepiride prior to admission, glimepiride held, using Novolog correction scale  Significant events:  10/30: TPN started at 40 ml/hr 11/1: advanced TPN to 60 m/hr, removed regular insulin from TPN and added Lantus BID 11/3: total colectomy with ileostomy 11/5: TPN at goal of 18400ml/hr, lantus dose decreased and regular insulin added to TPN 11/11: Lantus discontinued 11/12: Electrolytes removed from TNA due to elevated Phos 11/13: Electrolytes wnl so electrolytes added back to TNA. NG clamping trials. 11/14: NG dc'd. 11/16: CL diet from 11/15, good intake dinner 11/15, ~ no intake 11/16 breakfast-states not hungry. Concern with AMS changes  11/17: mentation cleared, to soft diet, po supplements ordered  11/15:  Glucose - CBGs above goal < 152-> 170, clear liquids can quickly increase CBG's  Electrolytes - wnl. CorrCa 10.8.   Renal - SCr WNL  LFTs - WNL(11/12)  TGs - 96 (11/2), 144 (11/9) 101 (11/16)  Prealbumin - 12.7 (10/30), 10.3 (11/2), 5 (11/9) 15.5 (11/16)  WBC remains elevated 18.5K  NUTRITIONAL GOALS RD recs: 2200-2400 kcals, 115-130gm protein Clinimix 5/15 at a  goal rate of 15700ml/hr + 20% fat emulsion at 2010ml/hr to provide: 120g/day protein, 2184Kcal/day. Add Prostat bid - would deliver 15gm protein each Add Magic cup tid - would deliver 9gm protein each  PLAN   Cont Clinimix E 5/15 at goal rate of 17900ml/hr.  Continue with regular insulin 75 units in 3L TPN bag (25 units/L) - to deliver 60 units regular insulin/24h.  20% fat emulsion at 6910ml/hr.  TPN to contain standard multivitamins and trace elements.  [facility].  Continue moderate SSI q8h.  TPN lab panels on Mondays & Thursdays.  Rate decrease when supplements tolerated-avoid overfeeding  Otho BellowsGreen, Tae Robak L PharmD Pager  [phone] 09/24/2014, 2:42 PM.

## 2014-09-24 NOTE — Progress Notes (Signed)
PT Cancellation Note  Patient Details Name: Ronald Mayo MRN: [id number] DOB: 08/25/1941   Cancelled Treatment:     CT plus pt hallucinating.  Nursing requested we hold off for today.  Will check back as schedule permits.   Felecia ShellingLori Ivylynn Hoppes  PTA [facility]  Acute  Rehab Pager      [phone]

## 2014-09-24 NOTE — Progress Notes (Signed)
Patient ID: Ronald QuarryWilliam M Devinney, male   DOB: 1941/09/02, 73 y.o.   MRN: [id number]  [facility] PROGRESS NOTE  Ronald QuarryWilliam M Yilmaz WUJ:[id number] DOB: 1941/09/02 DOA: 09/02/2014 PCP: Aniceto BossSETTLE,PAUL C, MD   Brief narrative: 73 year old male with ulcerative colitis on sulfasalazine, DM type II (on Amaryl), presented 10/26 with main concern of several weeks duration or persistent diarrhea and more recently blood in stool. This has been associated with poor oral intake, malaise, weight loss, subjective fevers, chills, intermittent abd cramping, non radiation, with no specific alleviating or aggravating factors. Ct abd and pelvis c/w active UC. 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 Dwain SarnaWakefield 11/03. Now with concern for post op stump leak, based on drainage from the JP/CT findings: 4.2 x 4.4 x 4.8 cm focal collection of debris and gas in the right central mesentery. Due to complex chronic medical conditions imposed on new acute complications noted below, [facility] Discussed with surgery team.   Assessment and Plan:    Impression/Recommendations Active Problems: Ulcerative colitis - with sigmoid stricture - on colonoscopy by Dr. Leone PayorGessner - 09/04/2014  - pt is now s/p total abdominal colectomy with end ileostomy by Dr. Dwain SarnaWakefield 11/03 - concern of post op stump leak, based on drainage from the JP/CT findings: 4.2 x 4.4 x 4.8 cm focal collection of debris and gas in the right central mesentery - continue Zosyn day #9 for mesenteric abscess  - repeat CT abd 11/16 with new small abscess, no surgical intervention needed  - management per surgical team  - appreciate surgery assistance  - advance diet  Acute encephalopathy  - worse AM 11/16, pt more agitated but AM 11/17 much clearer  - PCA Dilaudid stopped 11/15 as it could contribute to confusion - provide analgesia, preferably non narcotic as needed  Acute on chronic blood loss anemia, UC  with post op bleed - Hg trending down over the past 72 hours: 9.7 --> 9.2 --> 8.7 --> 8.8 --> 8.5 --> 8.6 --> 8.8 - started on prophylactic Lovenox 11/14, changed to Heparin SQ 11/15 - pt is at high risk for DVT  - no further rectal bleeding per pt  Atrial fib - rate controlled - not AC candidate due to acute issues, per cardiology - appreciate assistance of cardiologist  - will plan to convert to PO Metoprolol once pt able to take PO, possibly in AM Leukocytosis - secondary to the above, active problem and worse 11/16: 15.7 --> 16.9 --> 18.5  - CT abd per surgery, results below  - continue Zosyn day #9, abd cultures pending  - CBC in AM DM type II - pt at home on Amaryl, on hold  - continue only SSI for now Protein-calorie malnutrition, severe  - secondary to progressive nature of UC and acute illness  - NGT out 11/14, pt stable and reports feeling better  Chronic venous insufficiency ulcerations, left medial malleolus, left heel. - Measurement: medial malleolar ulceration has healed. Left heel ulcer measures 0.5cm x 0.8cm, x 0.4cm - Wound NFA:OZHYQbed:clean, pink, moist, mild drainage serous  - recommend regranex applied following cleanse with NS, and pat dry. This is topped with a soft silicone foam dressing - appreciate wound care team assistance  Ostomy  - RUQ ileostomy, 1 and 5/8 inches round, red, slightly budded - appreciate wound care team help  Acute functional quadriplegia - very weak, will need SNF upon discharge   DVT prophylaxis  Heparin SQ, started 11/15  Code Status:  Full Family Communication: No family at bedside  Disposition Plan: Remains inpatient, SNF when ready   IV Access:    Peripheral IV Procedures and diagnostic studies:    Ct Abdomen Pelvis W Contrast 09/16/2014 4.2 x 4.4 x 4.8 cm focal collection of debris and gas in the right central mesentery, adjacent to the superior mesenteric vein. This particular location has a different  appearance than any of the other fluid seen in the abdomen or pelvis and given the history of leukocytosis, raises concern for evolving abscess, although it does not have a well-defined or enhancing rim at this time. Status post subtotal colectomy with right abdominal end ileostomy. No evidence for bowel obstruction. There is a small amount of ascites adjacent to the liver and in the right paracolic gutter and subtle peritoneal enhancement is seen along some of the fluid collections although they do not appear to be well organized at this time. A very small amount of intraperitoneal free air is associated with free air in the rectus sheath and anterior abdominal wall, not unexpected 6 days out from surgery.  Ct Abdomen Pelvis W Contrast 09/04/2014 Left-sided colitis, likely representing active ulcerative colitis. Infectious could look similar. Ischemia felt unlikely, given distribution and clinical history.Atherosclerosis, coronary arteries   Dg Chest Port 1 View 09/12/2014 PICC line tip in the right atrium.   Dg Chest Port 1 View 09/06/2014 Right PICC line placed, tip at the SVC level. No acute cardiopulmonary abnormality.  Medical Consultants:    Surgery  Cardiology -- signed off  GI -- signed off  Other Consultants:    PT - recommend SNF upon discharge  OT - recommend SNF upon discharge  Wound care team  Anti-Infectives:    Zosyn 11/09 -->  Debbora PrestoMAGICK-Makenli Derstine, MD  [facility] Pager [phone]  If 7PM-7AM, please contact night-coverage [email or web address] Password [email or web address] 09/24/2014, 5:46 PM   LOS: 22 days   HPI/Subjective: No events overnight.   Objective: Filed Vitals:   09/24/14 1000 09/24/14 1048 09/24/14 1400 09/24/14 1434  BP: 113/68 117/68 123/72 121/61  Pulse: 103 94 92 88  Temp: 98 F (36.7 C) 97.4 F (36.3 C) 98.1 F (36.7 C) 98.1 F (36.7 C)  TempSrc: Oral Oral Oral Oral  Resp: 16 16 16 18   Height:      Weight:      SpO2: 98% 96% 98% 97%     Intake/Output Summary (Last 24 hours) at 09/24/14 1746 Last data filed at 09/24/14 1443  Gross per 24 hour  Intake   2530 ml  Output   1825 ml  Net    705 ml    Exam:   General:  Pt is alert, follows commands appropriately, not in acute distress  Cardiovascular: Regular rate and rhythm, no rubs, no gallops  Respiratory: Clear to auscultation bilaterally, no wheezing, no crackles, no rhonchi  Abdomen: Soft, non tender, non distended, open wound  Data Reviewed: Basic Metabolic Panel:  Recent Labs Lab 09/19/14 0520 09/20/14 0443 09/22/14 0340 09/23/14 0509 09/24/14 0445  NA 133* 134* 137 137 138  K 4.2 4.3 4.3 4.2 4.3  CL 93* 94* 98 100 101  CO2 30 30 28 26 23   GLUCOSE 138* 127* 92 130* 145*  BUN 16 18 17 16 19   CREATININE 0.66 0.69 0.70 0.79 0.73  CALCIUM 8.4 8.5 8.7 9.0 8.8  MG 2.2 2.1 2.1 2.1  --   PHOS 4.8* 3.9 4.3 4.0  --    Liver Function Tests:  Recent Labs Lab 09/19/14  0520 09/23/14 0509  AST 22 21  ALT 16 20  ALKPHOS 108 114  BILITOT 0.3 0.3  PROT 5.9* 6.4  ALBUMIN 1.4* 1.8*   CBC:  Recent Labs Lab 09/21/14 0405 09/22/14 0340 09/22/14 1435 09/23/14 0509 09/24/14 0445  WBC 12.6* 14.4* 15.7* 16.9* 18.5*  NEUTROABS  --   --   --  12.4*  --   HGB 8.5* 8.6* 8.5* 8.6* 8.8*  HCT 25.9* 26.4* 26.6* 26.1* 27.4*  MCV 93.8 92.6 94.0 93.5 93.2  PLT 632* 808* 819* 839* 932*   CBG:  Recent Labs Lab 09/23/14 0509 09/23/14 1426 09/23/14 2153 09/24/14 0532 09/24/14 1452  GLUCAP 130* 152* 170* 154* 164*    Recent Results (from the past 240 hour(s))  Wound culture     Status: None   Collection Time: 09/18/14 10:14 AM  Result Value Ref Range Status   Specimen Description ABDOMEN  Final   Special Requests Immunocompromised  Final   Gram Stain   Final    ABUNDANT WBC PRESENT,BOTH PMN AND MONONUCLEAR NO SQUAMOUS EPITHELIAL CELLS SEEN FEW GRAM POSITIVE COCCI IN PAIRS Performed at [facility]    Culture   Final    MULTIPLE  ORGANISMS PRESENT, NONE PREDOMINANT Note: NO STAPHYLOCOCCUS AUREUS ISOLATED NO GROUP A STREP (S.PYOGENES) ISOLATED Performed at [facility]    Report Status 09/20/2014 FINAL  Final     Scheduled Meds: . acetaminophen  650 mg Oral TID  . becaplermin   Topical Q7 days  . citalopram  20 mg Oral Daily  . feeding supplement (PRO-STAT SUGAR FREE 64)  30 mL Oral BID WC  . heparin subcutaneous  5,000 Units Subcutaneous 3 times per day  . insulin aspart  0-15 Units Subcutaneous 3 times per day  . lip balm  1 application Topical BID  . mesalamine  1,000 mg Rectal QHS  . metoprolol  2.5 mg Intravenous 4 times per day  . pantoprazole (PROTONIX) IV  40 mg Intravenous Q24H  . piperacillin-tazobactam (ZOSYN)  IV  3.375 g Intravenous 3 times per day  . sodium chloride  10-40 mL Intracatheter Q12H  . sodium hypochlorite   Topical TID   Continuous Infusions: . sodium chloride 10 mL/hr at 09/21/14 1954  . Marland KitchenTPN (CLINIMIX-E) Adult Stopped (09/24/14 1736)   And  . fat emulsion Stopped (09/24/14 1736)  . Marland KitchenTPN (CLINIMIX-E) Adult 100 mL/hr at 09/24/14 1737   And  . fat emulsion 250 mL (09/24/14 1738)

## 2014-09-24 NOTE — Progress Notes (Signed)
Physical Therapy Treatment Patient Details Name: Ronald Mayo MRN: [id number] DOB: 1941-09-25 Today's Date: 09/24/2014    History of Present Illness 73 y.o. male with h/o ulcerative colitis, DM, CAD, a fib, chronic diarrhea, chronic LLE wound admitted with bloody stools, abdominal pain and significant weight loss. Dx of colon stricture. 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 PT Re-eval-09/11/14    PT Comments    Patient is alert, able to participate, not confused. Continue PT .  Follow Up Recommendations  SNF     Equipment Recommendations  None recommended by PT    Recommendations for Other Services       Precautions / Restrictions Precautions Precautions: Fall Precaution Comments: chronic wound L heel, pt wears compression stocking. , L abdominal drain. , colostomy, HOH, check sats    Mobility  Bed Mobility   Bed Mobility: Sidelying to Sit Rolling: Supervision Sidelying to sit: Min assist;HOB elevated Supine to sit: Mod assist     General bed mobility comments: increased time , multimodal cues  for abdominal protection  Transfers Overall transfer level: Needs assistance Equipment used: Rolling walker (2 wheeled) Transfers: Sit to/from Stand Sit to Stand: Min assist         General transfer comment: cues for hand palcement, extra [name]  Ambulation/Gait Ambulation/Gait assistance: Min assist;+2 safety/equipment Ambulation Distance (Feet): 100 Feet Assistive device: Rolling walker (2 wheeled) Gait Pattern/deviations: Step-to pattern;Steppage Gait velocity: decr   General Gait Details: Pt fatigues easily; required 1 standing rest breaks; on RA, sats 100% when returned to room,, HR 106   Stairs            Wheelchair Mobility    Modified Rankin (Stroke Patients Only)       Balance                                    Cognition Arousal/Alertness: Awake/alert Behavior During Therapy: WFL for  tasks assessed/performed Overall Cognitive Status: Within Functional Limits for tasks assessed                 General Comments: patient had AMS past several days requiring sitter, today patient is calm, not disoriented, able to participate.    Exercises      General Comments        Pertinent Vitals/Pain      Home Living                      Prior Function            PT Goals (current goals can now be found in the care plan section) Progress towards PT goals: Progressing toward goals    Frequency  Min 3X/week    PT Plan Current plan remains appropriate    Co-evaluation             End of Session Equipment Utilized During Treatment: Gait belt Activity Tolerance: Patient tolerated treatment well Patient left: in chair;with call bell/phone within reach     Time: [id number] PT Time Calculation (min) (ACUTE ONLY): 21 min  Charges:  $Gait Training: 8-22 mins                    G Codes:      Rada HayHill, Keymari Sato Elizabeth 09/24/2014, 5:10 PM Blanchard KelchKaren Lennyx [place]dell PT [phone]

## 2014-09-24 NOTE — Progress Notes (Signed)
NUTRITION FOLLOW UP  Intervention:   -TPN per pharmacy; currently at goal rate, monitor CBG's (elevated) -RD to order Calorie Count -Provide Prostat liquid protein PO 30 ml BID with meals, each supplement provides 100 kcal, 15 grams protein. -Provide Magic cup TID with meals, each supplement provides 290 kcal and 9 grams of protein -Monitor diet education needs as diet advancement tolerated -RD to continue to follow  Nutrition Dx:   Inadequate oral intake related to nausea/abd pain as evidenced by PO intake < 75% for 2 months, 30 lb weight loss in 3 months; ongoing  Goal:   Pt to meet >/= 90% of their estimated nutrition needs; being met  Monitor:   TPN tolerance, PO and supplemental intake, diet order, labs, weights, GI profile  Assessment:   11/17 -RD consulted to help manage PO intake to increase wound healing. Diet advanced to soft diet. -Pt's mental status improved. -Pt continues to receive TPN at goal rate. -CBGs are elevated, 152-170s -Pt reports feeling very hungry this AM. Cream soup was brought to pt but pt is taking PO intake slowly to ensure tolerance. -PO intake: 25% clear liquids (2 popsicles) -Pt is willing to try Magic cups with meals and prostat added to beverages to provide extra protein to aid in wound healing. -If pt consumes and tolerates 50% or more of meals, TPN can be decreased to prevent decreasing appetite and to better control CBGs. -RD to order calorie count  11/16 Pt receiving clear liquids. PO intake poor. Pt is refusing liquids at this time d/t altered mental status. Pt did have some clear liquids on 11/15. Per pharmacy, hesitant to decrease TPN d/t altered mental status.  NG tube removed 11/14 CBGs within goal range <150 CT scheduled for today  11/13:  Patient remains NPO. Patient asleep at time of visit with NG tube clamped. He continues to receive TPN at goal rate: Clinimix 5/15 at goal rate of 100 ml/hr with 20% lipids at 10 mlhr, providing  2184 kcal (100% est kcal needs), and 120 gram protein (100% est protein needs). His weight has trended up some with a 5 lbs weight gain in the past 2 weeks. Per MD note, may be able to remove NG tube later today.  Labs: glucose ranging 116 to 141 mg/dL, low hemoglobin, low sodium  11/06: -Clinimix 5/15 at goal rate of 100 ml/hr with 20% lipids at 10 mlhr, providing 2184 kcal (100% est kcal needs), and 120 gram protein (100% est protein needs) -Discussed pt with pharm on 11/05 re insulin modification and blood glucose control. RD in agreement with PharmD that pt would likely benefit from regular insulin being added in TPN with reduction in lantus dose -CBG currently at goal, < 150 mg/dL -Surgery recommended to continue NPO status; pt with some abd pain -WOC eval on 11/05 for venous stasis ulcers on heel and left lower leg, and provided ostomy care  Height: Ht Readings from Last 1 Encounters:  09/10/14 6' (1.829 m)    Weight Status:   Wt Readings from Last 1 Encounters:  09/17/14 229 lb 8 oz (104.1 kg)  09/10/14 224 lb 09/02/14 216 lb  Re-estimated needs:  Kcal: 2200-2400  Protein: 115-130 gram  Fluid: >/= 2200 ml daily    Skin:+1 LLE edema; wound on left heel; closed incision on abdomen  Diet Order: TPN (CLINIMIX-E) Adult DIET SOFT    Intake/Output Summary (Last 24 hours) at 09/24/14 1107 Last data filed at 09/24/14 0534  Gross per 24 hour  Intake   1490 ml  Output   1885 ml  Net   -395 ml    Last BM: 11/15 via ileostomy  Labs:   Recent Labs Lab 09/20/14 0443 09/22/14 0340 09/23/14 0509 09/24/14 0445  NA 134* 137 137 138  K 4.3 4.3 4.2 4.3  CL 94* 98 100 101  CO2 '30 28 26 23  ' BUN '18 17 16 19  ' CREATININE 0.69 0.70 0.79 0.73  CALCIUM 8.5 8.7 9.0 8.8  MG 2.1 2.1 2.1  --   PHOS 3.9 4.3 4.0  --   GLUCOSE 127* 92 130* 145*    CBG (last 3)   Recent Labs  09/23/14 1426 09/23/14 2153 09/24/14 0532  GLUCAP 152* 170* 154*    Scheduled Meds: .  acetaminophen  650 mg Oral TID  . becaplermin   Topical Q7 days  . citalopram  20 mg Oral Daily  . feeding supplement (PRO-STAT SUGAR FREE 64)  30 mL Oral BID WC  . heparin subcutaneous  5,000 Units Subcutaneous 3 times per day  . insulin aspart  0-15 Units Subcutaneous 3 times per day  . lip balm  1 application Topical BID  . mesalamine  1,000 mg Rectal QHS  . metoprolol  2.5 mg Intravenous 4 times per day  . pantoprazole (PROTONIX) IV  40 mg Intravenous Q24H  . piperacillin-tazobactam (ZOSYN)  IV  3.375 g Intravenous 3 times per day  . sodium chloride  10-40 mL Intracatheter Q12H  . sodium hypochlorite   Topical TID    Continuous Infusions: . sodium chloride 10 mL/hr at 09/21/14 1954  . Marland KitchenTPN (CLINIMIX-E) Adult 100 mL/hr at 09/23/14 1706   And  . fat emulsion 250 mL (09/23/14 1706)    Clayton Bibles, MS, RD, LDN Pager: [phone] After Hours Pager: [phone]

## 2014-09-25 DIAGNOSIS — K651 Peritoneal abscess: Secondary | ICD-10-CM

## 2014-09-25 LAB — GLUCOSE, CAPILLARY
GLUCOSE-CAPILLARY: 161 mg/dL — AB (ref 70–99)
Glucose-Capillary: 146 mg/dL — ABNORMAL HIGH (ref 70–99)
Glucose-Capillary: 146 mg/dL — ABNORMAL HIGH (ref 70–99)

## 2014-09-25 LAB — CBC
HEMATOCRIT: 27.2 % — AB (ref 39.0–52.0)
Hemoglobin: 8.7 g/dL — ABNORMAL LOW (ref 13.0–17.0)
MCH: 30.3 pg (ref 26.0–34.0)
MCHC: 32 g/dL (ref 30.0–36.0)
MCV: 94.8 fL (ref 78.0–100.0)
Platelets: 840 10*3/uL — ABNORMAL HIGH (ref 150–400)
RBC: 2.87 MIL/uL — ABNORMAL LOW (ref 4.22–5.81)
RDW: 15 % (ref 11.5–15.5)
WBC: 18.4 10*3/uL — ABNORMAL HIGH (ref 4.0–10.5)

## 2014-09-25 MED ORDER — INSULIN REGULAR HUMAN 100 UNIT/ML IJ SOLN
INTRAVENOUS | Status: AC
  Administered 2014-09-25: 17:00:00 via INTRAVENOUS
  Filled 2014-09-25: qty 2000

## 2014-09-25 MED ORDER — ADULT MULTIVITAMIN W/MINERALS CH
1.0000 | ORAL_TABLET | Freq: Every day | ORAL | Status: DC
  Administered 2014-09-25 – 2014-09-26 (×2): 1 via ORAL
  Filled 2014-09-25 (×2): qty 1

## 2014-09-25 MED ORDER — ALTEPLASE 2 MG IJ SOLR
2.0000 mg | Freq: Once | INTRAMUSCULAR | Status: AC
  Administered 2014-09-25: 2 mg
  Filled 2014-09-25: qty 2

## 2014-09-25 MED ORDER — PANTOPRAZOLE SODIUM 40 MG PO TBEC
40.0000 mg | DELAYED_RELEASE_TABLET | Freq: Every day | ORAL | Status: DC
  Administered 2014-09-26 – 2014-10-01 (×6): 40 mg via ORAL
  Filled 2014-09-25 (×6): qty 1

## 2014-09-25 MED ORDER — METOPROLOL TARTRATE 25 MG PO TABS
25.0000 mg | ORAL_TABLET | Freq: Two times a day (BID) | ORAL | Status: DC
  Administered 2014-09-25 – 2014-10-01 (×11): 25 mg via ORAL
  Filled 2014-09-25 (×13): qty 1

## 2014-09-25 MED ORDER — FAT EMULSION 20 % IV EMUL
250.0000 mL | INTRAVENOUS | Status: AC
  Administered 2014-09-25: 250 mL via INTRAVENOUS
  Filled 2014-09-25: qty 250

## 2014-09-25 NOTE — Progress Notes (Signed)
CSW assisting with d/c planning. PN reviewed. Decreased confusion noted by staff. Pt was able to work with PT on 11/17. TPN still required. SNF placement remains appropriate. Pt has [employer] insurance which requires prior authorization ( 48 hr advance notice ).  Pt/family's 1st SNF choice is [facility] ( not able to provide TPN ).  Second SNF choice is [facility] ( able to offer TPN with 24 hr advance notice). CSW will continue to follow to assist with d/c planning needs.  Cori RazorJamie Kaiah Hosea LCSW [phone]

## 2014-09-25 NOTE — Progress Notes (Signed)
15 Days Post-Op  Subjective: He looks better, his mentation is better, he knew my name today.  He is eating and says it hurt.  He has some old blood from rectum, but nothing new that I could see.   Objective: Vital signs in last 24 hours: Temp:  [97.7 F (36.5 C)-98.1 F (36.7 C)] 97.7 F (36.5 C) (11/18 0600) Pulse Rate:  [74-92] 74 (11/18 0600) Resp:  [16-18] 18 (11/18 0600) BP: (110-123)/(52-72) 115/52 mmHg (11/18 0600) SpO2:  [97 %-98 %] 98 % (11/18 0600) Last BM Date: 09/25/14 680 PO yesterday 800 from the ileostomy Afebrile, VSS WBC has not improved. Anemia stable Intake/Output from previous day: 11/17 0701 - 11/18 0700 In: 2170 [P.O.:680; I.V.:120; IV Piggyback:50; TPN:1320] Out: 2400 [Urine:1600; Stool:800] Intake/Output this shift: Total I/O In: 2140 [P.O.:120; I.V.:160; IV Piggyback:100; TPN:1760] Out: 500 [Urine:500]  General appearance: alert, cooperative and no distress Resp: clear to auscultation bilaterally GI: soft,sore,  drain site right gulteal, is brown-yellow feculent appearing.  Lab Results:   Recent Labs  09/24/14 0445 09/25/14 0624  WBC 18.5* 18.4*  HGB 8.8* 8.7*  HCT 27.4* 27.2*  PLT 932* 840*    BMET  Recent Labs  09/23/14 0509 09/24/14 0445  NA 137 138  K 4.2 4.3  CL 100 101  CO2 26 23  GLUCOSE 130* 145*  BUN 16 19  CREATININE 0.79 0.73  CALCIUM 9.0 8.8   PT/INR No results for input(s): LABPROT, INR in the last 72 hours.   Recent Labs Lab 09/19/14 0520 09/23/14 0509  AST 22 21  ALT 16 20  ALKPHOS 108 114  BILITOT 0.3 0.3  PROT 5.9* 6.4  ALBUMIN 1.4* 1.8*     Lipase     Component Value Date/Time   LIPASE 22.0 03/03/2007 0941     Studies/Results: Ct Abdomen Pelvis W Contrast  09/23/2014   CLINICAL DATA:  Subsequent encounter for status post total colectomy and ileostomy on January 24, 202015, with increased white blood count and anemia  EXAM: CT ABDOMEN AND PELVIS WITH CONTRAST  TECHNIQUE: Multidetector CT imaging of  the abdomen and pelvis was performed using the standard protocol following bolus administration of intravenous contrast.  CONTRAST:  50mL OMNIPAQUE IOHEXOL 300 MG/ML SOLN, 100mL OMNIPAQUE IOHEXOL 300 MG/ML SOLN  COMPARISON:  09/16/2014  FINDINGS: No acute musculoskeletal findings. Visualized portions of the lung bases clear.  Liver is normal. Gallbladder mildly distended but otherwise normal. Spleen and pancreas normal.  Adrenal glands normal. Numerous renal cysts stable. Calcification of the aorta stable.  Status post total colectomy. Right ileostomy. Extensive postsurgical wound in the midline. Deep to the caudal edge of the wound, there is a 22 x 22 mm rounded collection of gas and fluid consistent with a small abscess in the deep subcutaneous soft tissues/ anterior abdominal wall. There is a collection of fluid and gas with mild surrounding inflammatory change measuring 42 x 44 mm in the right abdomen mesentery, not significantly different from prior study.  Single small focus of gas in the nondependent portion of the bladder may be due to recent instrumentation. No other evidence to suggest that it is related to cystitis. Distal colon remnant noted. Percutaneous drainage catheter through the pelvis stable. Persistent but decreased overall peritoneal inflammatory change. Decreased pneumoperitoneum.  IMPRESSION: 1. Stable abscess in the right abdominal mesentery 2. New small abscess measuring about 2.2 cm located at the junction of the deep subcutaneous soft tissues and underlying fascia along the caudal aspect of postsurgical wound anterior abdominal wall.  3. There is persistent but improved peritoneal inflammatory change.   Electronically Signed   By: Esperanza Heiraymond  Rubner M.D.   On: 09/23/2014 16:13    Medications: . acetaminophen  650 mg Oral TID  . becaplermin   Topical Q7 days  . citalopram  20 mg Oral Daily  . feeding supplement (PRO-STAT SUGAR FREE 64)  30 mL Oral BID WC  . heparin subcutaneous  5,000  Units Subcutaneous 3 times per day  . insulin aspart  0-15 Units Subcutaneous 3 times per day  . lip balm  1 application Topical BID  . mesalamine  1,000 mg Rectal QHS  . metoprolol  2.5 mg Intravenous 4 times per day  . multivitamin with minerals  1 tablet Oral Daily  . pantoprazole (PROTONIX) IV  40 mg Intravenous Q24H  . piperacillin-tazobactam (ZOSYN)  IV  3.375 g Intravenous 3 times per day  . sodium chloride  10-40 mL Intracatheter Q12H  . 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 Leone PayorGessner - 09/04/2014 [note that this stricture was documented in 2011 on a colonoscopy attempt. S/p Total abdominal colectomy with end ileostomy, Dr. Harden MoMatt Wakefield, 09/10/14. Concern for stump leak, based on drainage from the JP/CT findings: 4.2 x 4.4 x 4.8 cm focal collection of debris and gas in the right central mesentery 2. Anemia - Hgb - 10/6 - 09/07/2014 transfused 09/14/14 3. Malnutrition - Prealbumin- 12.7 On TNA 4. DM 5. CAD 6. Chronic History of A. Fib rate in the 80's. 7. Very hard of hearing 8. DVT prophylaxis - on no chemoprophylaxis due to UC 9. Rectal bleeding on heparin last 11/10-11/15 (Heparin is off again for rectal bleeding)  Plan:   Continue current Rx.  I don't know if the pain is from his surgery or the remainder of his distal rectum.  He is on Mesalamine suppositories, does he need PO meds?  Will discuss with Medicine and Dr. Daphine DeutscherMartin.   LOS: 23 days    Lovella Hardie 09/25/2014

## 2014-09-25 NOTE — Progress Notes (Signed)
Patient ID: Ronald QuarryWilliam M Mayo, male   DOB: December 03, 1940, 73 y.o.   MRN: [id number]  [facility] PROGRESS NOTE  Ronald QuarryWilliam M Mayo [id number] DOB: December 03, 1940 DOA: 09/02/2014 PCP: Aniceto BossSETTLE,PAUL C, MD   Brief narrative: 73 year old male with ulcerative colitis on sulfasalazine, DM type II (on Amaryl), presented 10/26 with main concern of several weeks duration or persistent diarrhea and more recently blood in stool. This has been associated with poor oral intake, malaise, weight loss, subjective fevers, chills, intermittent abd cramping, non radiation, with no specific alleviating or aggravating factors. Ct abd and pelvis c/w active UC. 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 Dwain SarnaWakefield 11/03. Now with concern for post op stump leak, based on drainage from the JP/CT findings: 4.2 x 4.4 x 4.8 cm focal collection of debris and gas in the right central mesentery. Due to complex chronic medical conditions imposed on new acute complications noted below, [facility] Discussed with surgery team.   Assessment and Plan:    Impression/Recommendations Active Problems: Ulcerative colitis - with sigmoid stricture - on colonoscopy by Dr. Leone PayorGessner - 09/04/2014  - pt is now s/p total abdominal colectomy with end ileostomy by Dr. Dwain SarnaWakefield 11/03 - concern of post op stump leak, based on drainage from the JP/CT findings: 4.2 x 4.4 x 4.8 cm focal collection of debris and gas in the right central mesentery - continue Zosyn day #10 for mesenteric abscess  - repeat CT abd 11/16 with new small abscess, no surgical intervention needed  - management per surgical team  - will need to see with GI if mesalamine can be changed to PO but will leave suppository for now  - appreciate surgery assistance  - advance diet  Acute encephalopathy  - worse AM 11/16, pt more agitated but AM 11/17 much clearer ans till goon mental status this AM - PCA Dilaudid stopped 11/15  as it could contribute to confusion - provide analgesia, preferably non narcotic as needed  Acute on chronic blood loss anemia, UC with post op bleed - Hg trending down over the past 72 hours: 9.7 --> 9.2 --> 8.7 --> 8.8 --> 8.5 --> 8.6 --> 8.7 - started on prophylactic Lovenox 11/14, changed to Heparin SQ 11/15 - pt is at high risk for DVT  - no further rectal bleeding per pt  Atrial fib - rate controlled - not AC candidate due to acute issues, per cardiology - appreciate assistance of cardiologist  - will plan to convert to PO Metoprolol once pt able to take PO, possibly in AM Leukocytosis - secondary to the above, active problem and worse 11/16: 15.7 --> 16.9 --> 18.5 --> 18.4 - CT abd per surgery, results below  - continue Zosyn day #10, abd cultures pending  - CBC in AM DM type II - pt at home on Amaryl, on hold  - continue only SSI for now Protein-calorie malnutrition, severe  - secondary to progressive nature of UC and acute illness  - NGT out 11/14, pt stable and reports feeling better  Chronic venous insufficiency ulcerations, left medial malleolus, left heel. - Measurement: medial malleolar ulceration has healed. Left heel ulcer measures 0.5cm x 0.8cm, x 0.4cm - Wound VHQ:IONGEbed:clean, pink, moist, mild drainage serous  - recommend regranex applied following cleanse with NS, and pat dry. This is topped with a soft silicone foam dressing - appreciate wound care team assistance  Ostomy  - RUQ ileostomy, 1 and 5/8 inches round, red, slightly budded - appreciate  wound care team help  Acute functional quadriplegia - very weak, will need SNF upon discharge   DVT prophylaxis  Heparin SQ, started 11/15  Code Status: Full Family Communication: No family at bedside  Disposition Plan: Remains inpatient, SNF when ready   IV Access:    Peripheral IV Procedures and diagnostic studies:    Ct Abdomen Pelvis W Contrast 09/16/2014 4.2 x 4.4 x 4.8 cm  focal collection of debris and gas in the right central mesentery, adjacent to the superior mesenteric vein. This particular location has a different appearance than any of the other fluid seen in the abdomen or pelvis and given the history of leukocytosis, raises concern for evolving abscess, although it does not have a well-defined or enhancing rim at this time. Status post subtotal colectomy with right abdominal end ileostomy. No evidence for bowel obstruction. There is a small amount of ascites adjacent to the liver and in the right paracolic gutter and subtle peritoneal enhancement is seen along some of the fluid collections although they do not appear to be well organized at this time. A very small amount of intraperitoneal free air is associated with free air in the rectus sheath and anterior abdominal wall, not unexpected 6 days out from surgery.  Ct Abdomen Pelvis W Contrast 09/04/2014 Left-sided colitis, likely representing active ulcerative colitis. Infectious could look similar. Ischemia felt unlikely, given distribution and clinical history.Atherosclerosis, coronary arteries   Dg Chest Port 1 View 09/12/2014 PICC line tip in the right atrium.   Dg Chest Port 1 View 09/06/2014 Right PICC line placed, tip at the SVC level. No acute cardiopulmonary abnormality.  Medical Consultants:    Surgery  Cardiology -- signed off  GI -- signed off  Other Consultants:    PT - recommend SNF upon discharge  OT - recommend SNF upon discharge  Wound care team  Anti-Infectives:    Zosyn 11/09 -->   Debbora PrestoMAGICK-Roberto Romanoski, MD  [facility] Pager [phone]  If 7PM-7AM, please contact night-coverage [email or web address] Password [email or web address] 09/25/2014, 1:48 PM   LOS: 23 days   HPI/Subjective: No events overnight.   Objective: Filed Vitals:   09/24/14 1400 09/24/14 1434 09/24/14 2200 09/25/14 0600  BP: 123/72 121/61 110/62 115/52  Pulse: 92 88 84 74  Temp: 98.1 F (36.7 C)  98.1 F (36.7 C) 98 F (36.7 C) 97.7 F (36.5 C)  TempSrc: Oral Oral Oral Oral  Resp: 16 18 18 18   Height:      Weight:      SpO2: 98% 97% 98% 98%    Intake/Output Summary (Last 24 hours) at 09/25/14 1348 Last data filed at 09/25/14 1232  Gross per 24 hour  Intake   3630 ml  Output   2800 ml  Net    830 ml    Exam:   General:  Pt is alert, follows commands appropriately, not in acute distress  Cardiovascular: Regular rate and rhythm, no rubs, no gallops  Respiratory: Clear to auscultation bilaterally, no wheezing, no crackles, no rhonchi  Abdomen: Soft, non tender, non distended   Data Reviewed: Basic Metabolic Panel:  Recent Labs Lab 09/19/14 0520 09/20/14 0443 09/22/14 0340 09/23/14 0509 09/24/14 0445  NA 133* 134* 137 137 138  K 4.2 4.3 4.3 4.2 4.3  CL 93* 94* 98 100 101  CO2 30 30 28 26 23   GLUCOSE 138* 127* 92 130* 145*  BUN 16 18 17 16 19   CREATININE 0.66 0.69 0.70 0.79 0.73  CALCIUM 8.4 8.5 8.7 9.0 8.8  MG 2.2 2.1 2.1 2.1  --   PHOS 4.8* 3.9 4.3 4.0  --    Liver Function Tests:  Recent Labs Lab 09/19/14 0520 09/23/14 0509  AST 22 21  ALT 16 20  ALKPHOS 108 114  BILITOT 0.3 0.3  PROT 5.9* 6.4  ALBUMIN 1.4* 1.8*   No results for input(s): LIPASE, AMYLASE in the last 168 hours. No results for input(s): AMMONIA in the last 168 hours. CBC:  Recent Labs Lab 09/22/14 0340 09/22/14 1435 09/23/14 0509 09/24/14 0445 09/25/14 0624  WBC 14.4* 15.7* 16.9* 18.5* 18.4*  NEUTROABS  --   --  12.4*  --   --   HGB 8.6* 8.5* 8.6* 8.8* 8.7*  HCT 26.4* 26.6* 26.1* 27.4* 27.2*  MCV 92.6 94.0 93.5 93.2 94.8  PLT 808* 819* 839* 932* 840*   CBG:  Recent Labs Lab 09/23/14 2153 09/24/14 0532 09/24/14 1452 09/24/14 2235 09/25/14 0606  GLUCAP 170* 154* 164* 153* 146*    Recent Results (from the past 240 hour(s))  Wound culture     Status: None   Collection Time: 09/18/14 10:14 AM  Result Value Ref Range Status   Specimen Description ABDOMEN   Final   Special Requests Immunocompromised  Final   Gram Stain   Final    ABUNDANT WBC PRESENT,BOTH PMN AND MONONUCLEAR NO SQUAMOUS EPITHELIAL CELLS SEEN FEW GRAM POSITIVE COCCI IN PAIRS Performed at [facility]    Culture   Final    MULTIPLE ORGANISMS PRESENT, NONE PREDOMINANT Note: NO STAPHYLOCOCCUS AUREUS ISOLATED NO GROUP A STREP (S.PYOGENES) ISOLATED Performed at [facility]    Report Status 09/20/2014 FINAL  Final     Scheduled Meds: . acetaminophen  650 mg Oral TID  . becaplermin   Topical Q7 days  . citalopram  20 mg Oral Daily  . feeding supplement (PRO-STAT SUGAR FREE 64)  30 mL Oral BID WC  . heparin subcutaneous  5,000 Units Subcutaneous 3 times per day  . insulin aspart  0-15 Units Subcutaneous 3 times per day  . lip balm  1 application Topical BID  . mesalamine  1,000 mg Rectal QHS  . metoprolol  2.5 mg Intravenous 4 times per day  . multivitamin with minerals  1 tablet Oral Daily  . pantoprazole (PROTONIX) IV  40 mg Intravenous Q24H  . piperacillin-tazobactam (ZOSYN)  IV  3.375 g Intravenous 3 times per day  . sodium chloride  10-40 mL Intracatheter Q12H  . sodium hypochlorite   Topical TID   Continuous Infusions: . sodium chloride 10 mL/hr at 09/21/14 1954  . Marland KitchenTPN (CLINIMIX-E) Adult 100 mL/hr at 09/24/14 1737   And  . fat emulsion 250 mL (09/24/14 1738)  . Marland KitchenTPN (CLINIMIX-E) Adult     And  . fat emulsion

## 2014-09-25 NOTE — Progress Notes (Signed)
Occupational Therapy Treatment Patient Details Name: Ronald Mayo MRN: [id number] DOB: 04-14-1941 Today's Date: 09/25/2014    History of present illness 73 y.o. male with h/o ulcerative colitis, DM, CAD, a fib, chronic diarrhea, chronic LLE wound admitted with bloody stools, abdominal pain and significant weight loss. Dx of colon stricture. 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    OT comments  Pt is making excellent progress in OT.  Able to stand at sink for grooming and use AE for LB adls today  Follow Up Recommendations  SNF    Equipment Recommendations  Other (comment) (likely none, s/p colectomy/ileostomy)    Recommendations for Other Services      Precautions / Restrictions Precautions Precautions: Fall Precaution Comments: chronic wound L heel, pt wears compression stocking. , L abdominal drain, HOH, check sats Restrictions Weight Bearing Restrictions: No       Mobility Bed Mobility Overal bed mobility: Needs Assistance Bed Mobility: Supine to Sit     Supine to sit: Supervision Sit to supine: Supervision   General bed mobility comments: HOB raised.  Pt able to lift both legs up into bed  Transfers Overall transfer level: Needs assistance Equipment used: Rolling walker (2 wheeled) Transfers: Sit to/from Stand Sit to Stand: Supervision         General transfer comment: VCs for hand placement    Balance                                   ADL Overall ADL's : Needs assistance/impaired     Grooming: Oral care;Standing;Min guard       Lower Body Bathing: Sit to/from stand;With adaptive equipment;Minimal assistance       Lower Body Dressing: Minimal assistance;Sit to/from stand;With adaptive equipment                 General ADL Comments: educated on reacher and sock aide, and pt practiced with these for adls, sitting EOB to standing.  Ambulated around bed to sink to brush teeth, standing at sink.  Safety cues given with  walker through tight space      Vision                     Perception     Praxis      Cognition   Behavior During Therapy: [facility] for tasks assessed/performed Overall Cognitive Status: Within Functional Limits for tasks assessed                       Extremity/Trunk Assessment               Exercises     Shoulder Instructions       General Comments      Pertinent Vitals/ Pain       Pain Assessment: No/denies pain  Home Living                                          Prior Functioning/Environment              Frequency Min 2X/week     Progress Toward Goals  OT Goals(current goals can now be found in the care plan section)  Progress towards OT goals: Progressing toward goals     Plan      Co-evaluation  End of Session     Activity Tolerance Patient tolerated treatment well   Patient Left in bed;with call bell/phone within reach;with bed alarm set   Nurse Communication          Time: [phone] OT Time Calculation (min): 24 min  Charges: OT General Charges $OT Visit: 1 Procedure OT Treatments $Self Care/Home Management : 23-37 mins  Ronald Mayo 09/25/2014, 3:45 PM  Ronald Mayo, OTR/L [phone] 09/25/2014

## 2014-09-25 NOTE — Progress Notes (Signed)
Physical Therapy Treatment Patient Details Name: Ronald Mayo Fiveash MRN: [id number] DOB: [birth year] Today's Date: 09/25/2014    History of Present Illness 73 y.o. male with h/o ulcerative colitis, DM, CAD, a fib, chronic diarrhea, chronic LLE wound admitted with bloody stools, abdominal pain and significant weight loss. Dx of colon stricture. 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 PT Re-eval-09/11/14    PT Comments    Pt was able to complete gait training this tx of 150 feet with one standing rest break.  Pt on RA, O2 sats dropped to 85%; 2/4 on dyspnea scale.  Pt required A with hygiene this tx.    Follow Up Recommendations  SNF     Equipment Recommendations  None recommended by PT    Recommendations for Other Services OT consult     Precautions / Restrictions Precautions Precautions: Fall Precaution Comments: chronic wound L heel, pt wears compression stocking. , L abdominal drain, HOH, check sats Restrictions Weight Bearing Restrictions: No    Mobility  Bed Mobility Overal bed mobility: Needs Assistance Bed Mobility: Supine to Sit     Supine to sit: Supervision     General bed mobility comments: increased time  Transfers Overall transfer level: Needs assistance Equipment used: Rolling walker (2 wheeled) Transfers: Sit to/from Stand Sit to Stand: Supervision         General transfer comment: VCs for hand placement  Ambulation/Gait Ambulation/Gait assistance: Min guard Ambulation Distance (Feet): 150 Feet (one standing rest break) Assistive device: Rolling walker (2 wheeled) Gait Pattern/deviations: Step-through pattern Gait velocity: decr   General Gait Details: pt fatigued after 75 feet and required standing rest break; O2 sats dropped to 85% on RA; pt 2/4 on dyspnea scale; no other complaints   Stairs            Wheelchair Mobility    Modified Rankin (Stroke Patients Only)       Balance                                     Cognition Arousal/Alertness: Awake/alert Behavior During Therapy: WFL for tasks assessed/performed Overall Cognitive Status: Within Functional Limits for tasks assessed                      Exercises      General Comments        Pertinent Vitals/Pain Pain Assessment: No/denies pain    Home Living                      Prior Function            PT Goals (current goals can now be found in the care plan section) Progress towards PT goals: Progressing toward goals    Frequency  Min 3X/week    PT Plan Current plan remains appropriate    Co-evaluation             End of Session Equipment Utilized During Treatment: Gait belt Activity Tolerance: Patient tolerated treatment well Patient left: in bed;with call bell/phone within reach;with bed alarm set     Time: [id number] PT Time Calculation (min) (ACUTE ONLY): 25 min  Charges:                       G Codes:      Miller,Derrick, SPTA 09/25/2014, 3:23 PM   Reviewed above  Felecia ShellingLori Haygen Zebrowski  PTA [facility]  Acute  Rehab Pager      [phone]

## 2014-09-25 NOTE — Progress Notes (Addendum)
PARENTERAL NUTRITION CONSULT NOTE - Follow Up  Pharmacy Consult for TPN Indication: Severe ulcerative colitis and malnutrition  No Known Allergies  Patient Measurements: Height: 6' (182.9 cm) Weight: 229 lb 8 oz (104.1 kg) IBW/kg (Calculated) : 77.6 Usual Weight: ~ 246lbs (30 lb wt loss over 3 months)  Vital Signs: Temp: 97.7 F (36.5 C) (11/18 0600) Temp Source: Oral (11/18 0600) BP: 115/52 mmHg (11/18 0600) Pulse Rate: 74 (11/18 0600) Intake/Output from previous day: 11/17 0701 - 11/18 0700 In: 2170 [P.O.:680; I.V.:120; IV Piggyback:50; TPN:1320] Out: 2400 [Urine:1600; Stool:800] Intake/Output from this shift: Total I/O In: 120 [P.O.:120] Out: -  Labs:  Recent Labs  09/23/14 0509 09/24/14 0445 09/25/14 0624  WBC 16.9* 18.5* 18.4*  HGB 8.6* 8.8* 8.7*  HCT 26.1* 27.4* 27.2*  PLT 839* 932* 840*    Recent Labs  09/23/14 0509 09/24/14 0445  NA 137 138  K 4.2 4.3  CL 100 101  CO2 26 23  GLUCOSE 130* 145*  BUN 16 19  CREATININE 0.79 0.73  CALCIUM 9.0 8.8  MG 2.1  --   PHOS 4.0  --   PROT 6.4  --   ALBUMIN 1.8*  --   AST 21  --   ALT 20  --   ALKPHOS 114  --   BILITOT 0.3  --   PREALBUMIN 15.5*  --   TRIG 101  --    Estimated Creatinine Clearance: 104.1 mL/min (by C-G formula based on Cr of 0.73).    Recent Labs  09/24/14 1452 09/24/14 2235 09/25/14 0606  GLUCAP 164* 153* 146*   Insulin Requirements:  6 units/24 hr: SSI moderate q8h  Current Nutrition: diet advanced to soft Tolerating diet, taking Pro-Stat, Magic Cup last night (not seen on tray this am)  IVF: NS at 7010ml/hr   Central access:  PICC placed 10/30 TPN start date: 10/30  ASSESSMENT HPI: Ronald Mayo with known h/o ulcerative colitis.  Colonoscopy reveals severe UC and proctitis with friable strictures.  Biopsies consistent with UC and not malignancy. General surgery consulted and plan is for proctocolectomy and J-pouch.  Pharmacy asked to start TPN to improve nutritional status for  surgery. History of diabetes on glimepiride prior to admission, glimepiride held, using Novolog correction scale  Significant events:  10/30: TPN started at 40 ml/hr 11/1: advanced TPN to 60 m/hr, removed regular insulin from TPN and added Lantus BID 11/3: total colectomy with ileostomy 11/5: TPN at goal of 13000ml/hr, lantus dose decreased and regular insulin added to TPN 11/11: Lantus discontinued 11/12: Electrolytes removed from TNA due to elevated Phos 11/13: Electrolytes wnl so electrolytes added back to TNA. NG clamping trials. 11/14: NG dc'd. 11/16: CL diet from 11/15, good intake dinner 11/15, ~ no intake 11/16 breakfast-states not hungry. Concern with AMS changes  11/17: mentation cleared, to soft diet, po supplements ordered 11/18: tolerating diet, supplements  11/15:  Glucose - CBGs 164-> 146 acceptable,  goal < 150  Electrolytes - wnl. CorrCa 10.8.   Renal - SCr WNL  LFTs - WNL(11/12)  TGs - 96 (11/2), 144 (11/9) 101 (11/16)  Prealbumin - 12.7 (10/30), 10.3 (11/2), 5 (11/9) 15.5 (11/16)  WBC remains elevated 18.4K  NUTRITIONAL GOALS RD recs: 2200-2400 kcals, 115-130gm protein Clinimix 5/15 at a goal rate of 11300ml/hr + 20% fat emulsion at 3010ml/hr to provide: 120g/day protein, 2184Kcal/day. 11/17:Add Prostat bid - would deliver 15gm protein, 100 kCal each 11/17:Add Magic cup tid - would deliver 9gm protein, 290 kCal each  PLAN  Reduce Clinimix E 5/15 to 1860ml/hr - delivers 1.4L.  Continue with regular insulin in TPN - reduce to 18 units/liter, delivers 25 units/24 hr  20% fat emulsion at 1810ml/hr.  Multivitamin po  [facility]  Continue moderate SSI q8h.  TPN lab panels on Mondays & Thursdays.  Follow up daily  Otho BellowsGreen, Audria Takeshita L PharmD Pager [phone] 09/25/2014, 10:34 AM.

## 2014-09-26 LAB — CBC
HEMATOCRIT: 27.9 % — AB (ref 39.0–52.0)
Hemoglobin: 8.9 g/dL — ABNORMAL LOW (ref 13.0–17.0)
MCH: 29.9 pg (ref 26.0–34.0)
MCHC: 31.9 g/dL (ref 30.0–36.0)
MCV: 93.6 fL (ref 78.0–100.0)
Platelets: 933 10*3/uL (ref 150–400)
RBC: 2.98 MIL/uL — ABNORMAL LOW (ref 4.22–5.81)
RDW: 15 % (ref 11.5–15.5)
WBC: 18.9 10*3/uL — ABNORMAL HIGH (ref 4.0–10.5)

## 2014-09-26 LAB — COMPREHENSIVE METABOLIC PANEL
ALT: 26 U/L (ref 0–53)
AST: 17 U/L (ref 0–37)
Albumin: 1.9 g/dL — ABNORMAL LOW (ref 3.5–5.2)
Alkaline Phosphatase: 95 U/L (ref 39–117)
Anion gap: 10 (ref 5–15)
BUN: 19 mg/dL (ref 6–23)
CO2: 25 meq/L (ref 19–32)
CREATININE: 0.8 mg/dL (ref 0.50–1.35)
Calcium: 8.6 mg/dL (ref 8.4–10.5)
Chloride: 98 mEq/L (ref 96–112)
GFR calc Af Amer: >90 mL/min (ref >90)
GFR, EST NON AFRICAN AMERICAN: 87 mL/min — AB (ref >90)
GLUCOSE: 147 mg/dL — AB (ref 70–99)
Potassium: 4.7 mEq/L (ref 3.7–5.3)
Sodium: 133 mEq/L — ABNORMAL LOW (ref 137–147)
Total Bilirubin: 0.3 mg/dL (ref 0.3–1.2)
Total Protein: 6.5 g/dL (ref 6.0–8.3)

## 2014-09-26 LAB — PHOSPHORUS: Phosphorus: 3.8 mg/dL (ref 2.3–4.6)

## 2014-09-26 LAB — MAGNESIUM: MAGNESIUM: 2 mg/dL (ref 1.5–2.5)

## 2014-09-26 LAB — GLUCOSE, CAPILLARY
GLUCOSE-CAPILLARY: 185 mg/dL — AB (ref 70–99)
GLUCOSE-CAPILLARY: 174 mg/dL — AB (ref 70–99)
Glucose-Capillary: 145 mg/dL — ABNORMAL HIGH (ref 70–99)

## 2014-09-26 MED ORDER — FAT EMULSION 20 % IV EMUL
250.0000 mL | INTRAVENOUS | Status: DC
  Administered 2014-09-26: 250 mL via INTRAVENOUS
  Filled 2014-09-26: qty 250

## 2014-09-26 MED ORDER — SACCHAROMYCES BOULARDII 250 MG PO CAPS
250.0000 mg | ORAL_CAPSULE | Freq: Two times a day (BID) | ORAL | Status: DC
  Administered 2014-09-26 – 2014-10-01 (×11): 250 mg via ORAL
  Filled 2014-09-26 (×12): qty 1

## 2014-09-26 MED ORDER — TAB-A-VITE/IRON PO TABS
1.0000 | ORAL_TABLET | Freq: Every day | ORAL | Status: DC
  Administered 2014-09-26 – 2014-09-27 (×2): 1 via ORAL
  Filled 2014-09-26 (×2): qty 1

## 2014-09-26 MED ORDER — BOOST / RESOURCE BREEZE PO LIQD
1.0000 | Freq: Two times a day (BID) | ORAL | Status: DC
  Administered 2014-09-26 – 2014-10-01 (×11): 1 via ORAL

## 2014-09-26 MED ORDER — INSULIN REGULAR HUMAN 100 UNIT/ML IJ SOLN
INTRAVENOUS | Status: DC
  Administered 2014-09-26: 18:00:00 via INTRAVENOUS
  Filled 2014-09-26: qty 2000

## 2014-09-26 MED ORDER — DAKINS (1/4 STRENGTH) 0.125 % EX SOLN
Freq: Three times a day (TID) | CUTANEOUS | Status: DC
Start: 2014-09-26 — End: 2014-09-27
  Administered 2014-09-26: 23:00:00 via TOPICAL
  Filled 2014-09-26: qty 473

## 2014-09-26 NOTE — Progress Notes (Signed)
CARE MANAGEMENT NOTE 09/26/2014  Patient:  Ronald Mayo,Ronald Mayo   Account Number:  [id number]  Date Initiated:  09/03/2014  Documentation initiated by:  Lanier ClamMAHABIR,Riddik Senna  Subjective/Objective Assessment:   73 Y/O Mayo ADMITTED W/ULCERATIVE COLITIS.JW:[id number]:ULCERATIVE COLITIS.     Action/Plan:   FROM HOME.   Anticipated DC Date:  09/30/2014   Anticipated DC Plan:  SKILLED NURSING FACILITY      DC Planning Services  CM consult      Choice offered to / List presented to:             Status of service:  Completed, signed off Medicare Important Message given?  YES (If response is "NO", the following Medicare IM given date fields will be blank) Date Medicare IM given:  09/09/2014 Medicare IM given by:  [facility],Ronald Mayo Date Additional Medicare IM given:   Additional Medicare IM given by:    Discharge Disposition:    Per UR Regulation:  Reviewed for med. necessity/level of care/duration of stay  If discussed at Long Length of Stay Meetings, dates discussed:   09/10/2014  09/12/2014  09/24/2014  09/26/2014    Comments:  09/26/14 Averiana Clouatre RN,BSN NCM [phone] MESENTERIC ABSCESS, S/P COLECTOMY,WEAN TPN.D/C PLAN SNF.  09/24/14 14:20 CM notes pt to go to SNF; CSW arranging.  No other CM needs were communicated.  Freddy JakschSarah Mayo, BSN, [place]CM [phone].  [id number]/Ronald Davis,RN,BSn,CCM: s/p Procedure(s): TOTAL COLECTOMY/PERMANENT ILEOSTOMY Patient Active Problem List    Diagnosis  Date Noted  .  Ulcerative colitis with intestinal obstruction    .  Stricture of sigmoid colon  09/04/2014  .  Protein-calorie malnutrition, severe  09/03/2014  .  Ulcerative colitis  09/02/2014  .  Weakness  09/02/2014  .  Diabetes  09/02/2014  .  GERD (gastroesophageal reflux disease)  02/04/2014  .  Depression  08/07/2013  .  SYNCOPE AND COLLAPSE  05/07/2010  .  SHORTNESS OF BREATH  05/07/2010  .  Coronary atherosclerosis  05/14/2009  .  Chronic atrial fibrillation  05/14/2009  .  SKIN CANCER, HX OF  05/14/2009   Post op course at high risk of complications due to chronic medical problems and steroid use. Plan: Hgb stable, start hep gtt   WBC rising.  Will get CT to eval for abscess Cont to ambulate frequently NPO until ileus resolves  [id number]/Assessment/Plan: per chart Ronald Davis,RN,BSN,CCM POD 1 tac/ileostomy 1. Continue pca 2. Pulmonary toilet 3. afib chronic, do not want to start ac for at least 48 hours after surgery, will have cards see again for recs given gi bleed is now gone 4. Npo, dc ng tube, bid dressing changes, continue tna 5. Sq heparin, protonix 6. Pt consult 7. Stay in stepdown until tomorrow [facility] CorporationWAKE[facility],Mayo 09/11/2014      [id number]/Ronald Davis,RN,BSn,CCM: Chart note: Principal Problem:   Ulcerative colitis with intestinal obstruction Active Problems:   Atrial fibrillation/  GERD (gastroesophageal reflux disease)/  Ulcerative colitis/  Diabetes/  Protein-calorie malnutrition, severe/  Stricture of sigmoid colon Currently day 1 s/p  total abdominal colectomy with end ileostomy. No chest pain or dyspnea presently, but with post-op pain.  Pt has permanent AF. Rate now is 100-105, but BP somewhat low at 98 to 105 systolic. He has received NS bolus. Currently NPO. Will write order for IV Lopressor 2.5 mg on a PRN basis if HR > 120. As BP increases can then change to every 4 hrs for rate control if BP stable and transition to oral therapy. Consider starting oral anticoagulation in several days when  stable from his abdominal surgery. 09/09/14 Bowdy Bair RN,BSN NCM [phone] FOR COLECTOMY/END ILEOSTOMY IN AM.TNA.NO ANTICIPATED D/C NEEDS.  09/03/14 [facility] AshlandKATHY Haleemah Buckalew RN, BSN, NCM [phone] MONITOR PROGRESS.NO ANTICIPATED D/C NEEDS.

## 2014-09-26 NOTE — Progress Notes (Signed)
Calorie Count Note  24 hour calorie count ordered.  Diet: Soft diet Supplements: Magic cup TID with meals, each supplement provides 290 kcal and 9 grams of protein Prostat liquid protein PO 30 ml BID with meals, each supplement provides 100 kcal, 15 grams protein.  Breakfast: 671 kcal, 20g protein Lunch: 818 kcal, 29g protein Dinner: none on 11/18 Supplements: 390 kcal, 24g protein  Total intake: 1879 kcal (85% of minimum estimated needs)  73g protein (63% of minimum estimated needs)  Nutrition Dx: Inadequate oral intake related to nausea/abd pain as evidenced by PO intake <75% for 2 months, 30 lb weight loss, improved.  Goal: Pt to meet >/= 90% of their estimated nutrition needs   Intervention:   Recommend further reducing or stopping TPN completely since PO intake has improved >50%.  Provide Resource Breeze po BID, each supplement provides 250 kcal and 9 grams of protein instead of Prostat BID  Continue to provide Magic cup TID with meals, each supplement provides 290 kcal and 9 grams of protein  Encourage PO intake  Ronald FrancoLindsey Ned Kakar, MS, RD, LDN Pager: [phone] After Hours Pager: [phone]

## 2014-09-26 NOTE — Plan of Care (Signed)
Problem: Phase I Progression Outcomes Goal: OOB as tolerated unless otherwise ordered Outcome: Completed/Met Date Met:  09/26/14 Goal: Incision/dressings dry and intact Outcome: Completed/Met Date Met:  09/26/14

## 2014-09-26 NOTE — Progress Notes (Signed)
16 Days Post-Op  Subjective: He continues to do well, calorie count is on the door.  He says he ate all of his breakfast. No real pain eating now.  He has some abdominal pain and says her PiCC line was bothering him, but seems better now.    Objective: Vital signs in last 24 hours: Temp:  [97.6 F (36.4 C)-98 F (36.7 C)] 97.6 F (36.4 C) (11/19 0600) Pulse Rate:  [65-90] 65 (11/19 0600) Resp:  [18] 18 (11/19 0600) BP: (110-121)/(59-67) 121/67 mmHg (11/19 0600) SpO2:  [95 %-100 %] 95 % (11/19 0600) Last BM Date: 09/25/14 480 PO recorded 525 measured from ileostomy 5 ml from the drain Intake/Output from previous day: 11/18 0701 - 11/19 0700 In: 3700 [P.O.:480; I.V.:360; IV Piggyback:100; TPN:2760] Out: 2105 [Urine:1575; Drains:5; Stool:525] Intake/Output this shift: Total I/O In: 360 [P.O.:360] Out: 200 [Urine:200]  General appearance: alert, cooperative and no distress Resp: clear to auscultation bilaterally and anterior GI: + BS, ileostomy is working, Open wound is making some improvement, still soupy/purulent drainage at the base.    Lab Results:   Recent Labs  09/25/14 0624 09/26/14 0542  WBC 18.4* 18.9*  HGB 8.7* 8.9*  HCT 27.2* 27.9*  PLT 840* 933*    BMET  Recent Labs  09/24/14 0445 09/26/14 0542  NA 138 133*  K 4.3 4.7  CL 101 98  CO2 23 25  GLUCOSE 145* 147*  BUN 19 19  CREATININE 0.73 0.80  CALCIUM 8.8 8.6   PT/INR No results for input(s): LABPROT, INR in the last 72 hours.   Recent Labs Lab 09/23/14 0509 09/26/14 0542  AST 21 17  ALT 20 26  ALKPHOS 114 95  BILITOT 0.3 0.3  PROT 6.4 6.5  ALBUMIN 1.8* 1.9*     Lipase     Component Value Date/Time   LIPASE 22.0 03/03/2007 0941     Studies/Results: No results found.  Medications: . acetaminophen  650 mg Oral TID  . becaplermin   Topical Q7 days  . citalopram  20 mg Oral Daily  . feeding supplement (PRO-STAT SUGAR FREE 64)  30 mL Oral BID WC  . heparin subcutaneous  5,000  Units Subcutaneous 3 times per day  . insulin aspart  0-15 Units Subcutaneous 3 times per day  . lip balm  1 application Topical BID  . mesalamine  1,000 mg Rectal QHS  . metoprolol tartrate  25 mg Oral BID  . multivitamin with minerals  1 tablet Oral Daily  . pantoprazole  40 mg Oral Daily  . piperacillin-tazobactam (ZOSYN)  IV  3.375 g Intravenous 3 times per day  . sodium chloride  10-40 mL Intracatheter Q12H  . 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 Leone PayorGessner - 09/04/2014 [note that this stricture was documented in 2011 on a colonoscopy attempt. S/p Total abdominal colectomy with end ileostomy, Dr. Harden MoMatt Wakefield, 09/10/14. Concern for stump leak, based on drainage from the JP/CT findings: 4.2 x 4.4 x 4.8 cm focal collection of debris and gas in the right central mesentery 2. Anemia - Hgb - 10/6 - 09/07/2014 transfused 09/14/14 3. Malnutrition - Prealbumin- 12.7 On TNA 4. DM 5. CAD 6. Chronic History of A. Fib rate in the 80's. 7. Very hard of hearing 8. DVT prophylaxis - on no chemoprophylaxis due to UC 9. Rectal bleeding on heparin last 11/10-11/15 (Heparin is off again for rectal bleeding)  Plan:  He looks better each  day.  If he does well with calorie count we will wean TNA.  Still has some rectal pain and abdominal pain.  I am going to make Dakins every other dressing change.   10 days on Zosyn. Adding probiotics.  WBC is still up.       LOS: 24 days    Ronald Mayo 09/26/2014

## 2014-09-26 NOTE — Progress Notes (Signed)
PARENTERAL NUTRITION CONSULT NOTE - Follow Up  Pharmacy Consult for TPN Indication: Severe ulcerative colitis and malnutrition  No Known Allergies  Patient Measurements: Height: 6' (182.9 cm) Weight: 229 lb 8 oz (104.1 kg) IBW/kg (Calculated) : 77.6 Usual Weight: ~ 246lbs (30 lb wt loss over 3 months)  Vital Signs: Temp: 97.6 F (36.4 C) (11/19 0600) Temp Source: Oral (11/19 0600) BP: 121/67 mmHg (11/19 0600) Pulse Rate: 65 (11/19 0600) Intake/Output from previous day: 11/18 0701 - 11/19 0700 In: 3700 [P.O.:480; I.V.:360; IV Piggyback:100; TPN:2760] Out: 2105 [Urine:1575; Drains:5; Stool:525] Intake/Output from this shift: Total I/O In: 360 [P.O.:360] Out: 200 [Urine:200] Labs:  Recent Labs  09/24/14 0445 09/25/14 0624 09/26/14 0542  WBC 18.5* 18.4* 18.9*  HGB 8.8* 8.7* 8.9*  HCT 27.4* 27.2* 27.9*  PLT 932* 840* 933*    Recent Labs  09/24/14 0445 09/26/14 0542  NA 138 133*  K 4.3 4.7  CL 101 98  CO2 23 25  GLUCOSE 145* 147*  BUN 19 19  CREATININE 0.73 0.80  CALCIUM 8.8 8.6  MG  --  2.0  PHOS  --  3.8  PROT  --  6.5  ALBUMIN  --  1.9*  AST  --  17  ALT  --  26  ALKPHOS  --  95  BILITOT  --  0.3   Estimated Creatinine Clearance: 104.1 mL/min (by C-G formula based on Cr of 0.8).    Recent Labs  09/25/14 1417 09/25/14 2202 09/26/14 0535  GLUCAP 146* 161* 145*   Insulin Requirements:  6 units/24 hr: SSI moderate q8h  Current Nutrition: diet advanced to soft Tolerating diet, taking Pro-Stat, Magic Cup last night (not seen on tray this am)  IVF: NS at 3910ml/hr   Central access:  PICC placed 10/30 TPN start date: 10/30  ASSESSMENT HPI: [id number] YOM with known h/o ulcerative colitis.  Colonoscopy reveals severe UC and proctitis with friable strictures.  Biopsies consistent with UC and not malignancy. General surgery consulted and plan is for proctocolectomy and J-pouch.  Pharmacy asked to start TPN to improve nutritional status for surgery. History  of diabetes on glimepiride prior to admission, glimepiride held, using Novolog correction scale  Significant events:  10/30: TPN started at 40 ml/hr 11/1: advanced TPN to 60 m/hr, removed regular insulin from TPN and added Lantus BID 11/3: total colectomy with ileostomy 11/5: TPN at goal of 14700ml/hr, lantus dose decreased and regular insulin added to TPN 11/11: Lantus discontinued 11/12: Electrolytes removed from TNA due to elevated Phos 11/13: Electrolytes wnl so electrolytes added back to TNA. NG clamping trials. 11/14: NG dc'd. 11/16: CL diet from 11/15, good intake dinner 11/15, ~ no intake 11/16 breakfast-states not hungry. Concern with AMS changes  11/17: mentation cleared, to soft diet, po supplements ordered 11/18: tolerating diet, supplements, TNA rate decreased 11/19: calorie count started - per discussion with Will, will plan on continuing TPN until calorie count finished the hopefully stop after that. Patient eating well now  11/19:  Glucose - CBGs acceptable,  goal < 150  Electrolytes - wnl   Renal - SCr WNL  LFTs - WNL(11/12)  TGs - 96 (11/2), 144 (11/9) 101 (11/16)  Prealbumin - 12.7 (10/30), 10.3 (11/2), 5 (11/9) 15.5 (11/16)  WBC remains elevated 18.9K, unchanged  NUTRITIONAL GOALS RD recs: 2200-2400 kcals, 115-130gm protein Clinimix 5/15 at a goal rate of 12200ml/hr + 20% fat emulsion at 9010ml/hr to provide: 120g/day protein, 2184Kcal/day. 11/17:Add Prostat bid - would deliver 15gm protein, 100  kCal each 11/17:Add Magic cup tid - would deliver 9gm protein, 290 kCal each  PLAN   Continue Clinimix E 5/15 at 8360ml/hr - delivers 1.4L.  Continue with regular insulin in TPN - continue 18 units/liter, delivers 25 units/24 hr  20% fat emulsion at 4210ml/hr.  Multivitamin po  [facility]  Continue moderate SSI q8h.  TPN lab panels on Mondays & Thursdays.  Follow up daily   Hessie KnowsJustin M Oneta Sigman, PharmD, BCPS Pager [phone] 09/26/2014 11:02  AM

## 2014-09-26 NOTE — Progress Notes (Signed)
CRITICAL VALUE ALERT  Critical value received:  Platelet 933  Date of notification:  09/26/2014  Time of notification:  0805  Critical value read back: yes   Nurse who received alert:  B.Noelle PennerGibbs RN  MD notified (1st page):  Izola PriceMyers MD  Time of first page:  0809  Time MD responded:  [id number]

## 2014-09-26 NOTE — Progress Notes (Signed)
Patient ID: Ronald Mayo, male   DOB: Jun 17, 1941, 73 y.o.   MRN: [id number]  [facility] PROGRESS NOTE  Ronald Mayo ION:[id number] DOB: Jun 17, 1941 DOA: 09/02/2014 PCP: Aniceto BossSETTLE,PAUL C, MD   Brief narrative: 73 year old male with ulcerative colitis on sulfasalazine, DM type II (on Amaryl), presented 10/26 with main concern of several weeks duration or persistent diarrhea and more recently blood in stool. This has been associated with poor oral intake, malaise, weight loss, subjective fevers, chills, intermittent abd cramping, non radiation, with no specific alleviating or aggravating factors. Ct abd and pelvis c/w active UC. 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 Dwain SarnaWakefield 11/03. Now with concern for post op stump leak, based on drainage from the JP/CT findings: 4.2 x 4.4 x 4.8 cm focal collection of debris and gas in the right central mesentery. Due to complex chronic medical conditions imposed on new acute complications noted below, [facility] Discussed with surgery team.   Assessment and Plan:    Impression/Recommendations Active Problems: Ulcerative colitis - with sigmoid stricture - on colonoscopy by Dr. Leone PayorGessner - 09/04/2014  - pt is now s/p total abdominal colectomy with end ileostomy by Dr. Dwain SarnaWakefield 11/03 - concern of post op stump leak, based on drainage from the JP/CT findings: 4.2 x 4.4 x 4.8 cm focal collection of debris and gas in the right central mesentery - continue Zosyn day #11 for mesenteric abscess  - repeat CT abd 11/16 with new small abscess, no surgical intervention needed  - management per surgical team  - continue Mesalamine supp for now per GI  - appreciate surgery assistance  Acute encephalopathy  - worse AM 11/16, pt more agitated but AM 11/17 much clearer ans till good mental status this AM - PCA Dilaudid stopped 11/15 as it could contribute to confusion - provide analgesia, preferably  non narcotic as needed  Acute on chronic blood loss anemia, UC with post op bleed - Hg trending down over the past 72 hours: 9.7 --> 9.2 --> 8.7 --> 8.8 --> 8.5 --> 8.6 --> 8.7 --> 8.9 - started on prophylactic Lovenox 11/14, changed to Heparin SQ 11/15 - pt is at high risk for DVT  - no further rectal bleeding per pt  Atrial fib - rate controlled - not AC candidate due to acute issues, per cardiology - appreciate assistance of cardiologist  - will plan to convert to PO Metoprolol once pt able to take PO, possibly in AM Leukocytosis - secondary to the above, active problem and worse 11/16: 15.7 --> 16.9 --> 18.5 --> 18.4 --> 18.9 - CT abd per surgery, results below  - continue Zosyn day #11, abd cultures pending  DM type II - pt at home on Amaryl, on hold  - continue only SSI for now Protein-calorie malnutrition, severe  - secondary to progressive nature of UC and acute illness  - NGT out 11/14, pt stable and reports feeling better  Chronic venous insufficiency ulcerations, left medial malleolus, left heel. - Measurement: medial malleolar ulceration has healed. Left heel ulcer measures 0.5cm x 0.8cm, x 0.4cm - Wound KGM:WNUUVbed:clean, pink, moist, mild drainage serous  - recommend regranex applied following cleanse with NS, and pat dry. This is topped with a soft silicone foam dressing - appreciate wound care team assistance  Ostomy  - RUQ ileostomy, 1 and 5/8 inches round, red, slightly budded - appreciate wound care team help  Acute functional quadriplegia - very weak, will need SNF upon discharge  DVT prophylaxis  Heparin SQ, started 11/15  Code Status: Full Family Communication: No family at bedside  Disposition Plan: Remains inpatient, SNF when ready   IV Access:    Peripheral IV Procedures and diagnostic studies:    Ct Abdomen Pelvis W Contrast 09/16/2014 4.2 x 4.4 x 4.8 cm focal collection of debris and gas in the right central  mesentery, adjacent to the superior mesenteric vein. This particular location has a different appearance than any of the other fluid seen in the abdomen or pelvis and given the history of leukocytosis, raises concern for evolving abscess, although it does not have a well-defined or enhancing rim at this time. Status post subtotal colectomy with right abdominal end ileostomy. No evidence for bowel obstruction. There is a small amount of ascites adjacent to the liver and in the right paracolic gutter and subtle peritoneal enhancement is seen along some of the fluid collections although they do not appear to be well organized at this time. A very small amount of intraperitoneal free air is associated with free air in the rectus sheath and anterior abdominal wall, not unexpected 6 days out from surgery.  Ct Abdomen Pelvis W Contrast 09/04/2014 Left-sided colitis, likely representing active ulcerative colitis. Infectious could look similar. Ischemia felt unlikely, given distribution and clinical history.Atherosclerosis, coronary arteries   Dg Chest Port 1 View 09/12/2014 PICC line tip in the right atrium.   Dg Chest Port 1 View 09/06/2014 Right PICC line placed, tip at the SVC level. No acute cardiopulmonary abnormality.  Medical Consultants:    Surgery  Cardiology -- signed off  GI -- signed off  Other Consultants:    PT - recommend SNF upon discharge  OT - recommend SNF upon discharge  Wound care team  Anti-Infectives:    Zosyn 11/09 -->  Debbora Presto, MD  [facility] Pager [phone]  If 7PM-7AM, please contact night-coverage [email or web address] Password [email or web address] 09/26/2014, 11:32 AM   LOS: 24 days   HPI/Subjective: No events overnight.   Objective: Filed Vitals:   09/25/14 0600 09/25/14 1400 09/25/14 2200 09/26/14 0600  BP: 115/52 113/65 110/59 121/67  Pulse: 74 81 90 65  Temp: 97.7 F (36.5 C) 97.8 F (36.6 C) 98 F (36.7 C) 97.6 F (36.4 C)   TempSrc: Oral Oral Oral Oral  Resp: 18 18 18 18   Height:      Weight:      SpO2: 98% 100% 99% 95%    Intake/Output Summary (Last 24 hours) at 09/26/14 1132 Last data filed at 09/26/14 1000  Gross per 24 hour  Intake   1920 ml  Output   1805 ml  Net    115 ml    Exam:   General:  Pt is alert, follows commands appropriately, not in acute distress  Cardiovascular: Regular rate and rhythm,  no rubs, no gallops  Respiratory: Clear to auscultation bilaterally, no wheezing, no crackles, no rhonchi  Abdomen: Soft, non tender, non distended, bowel sounds present, no guarding  Data Reviewed: Basic Metabolic Panel:  Recent Labs Lab 09/20/14 0443 09/22/14 0340 09/23/14 0509 09/24/14 0445 09/26/14 0542  NA 134* 137 137 138 133*  K 4.3 4.3 4.2 4.3 4.7  CL 94* 98 100 101 98  CO2 30 28 26 23 25   GLUCOSE 127* 92 130* 145* 147*  BUN 18 17 16 19 19   CREATININE 0.69 0.70 0.79 0.73 0.80  CALCIUM 8.5 8.7 9.0 8.8 8.6  MG 2.1 2.1 2.1  --  2.0  PHOS 3.9 4.3 4.0  --  3.8   Liver Function Tests:  Recent Labs Lab 09/23/14 0509 09/26/14 0542  AST 21 17  ALT 20 26  ALKPHOS 114 95  BILITOT 0.3 0.3  PROT 6.4 6.5  ALBUMIN 1.8* 1.9*   CBC:  Recent Labs Lab 09/22/14 1435 09/23/14 0509 09/24/14 0445 09/25/14 0624 09/26/14 0542  WBC 15.7* 16.9* 18.5* 18.4* 18.9*  NEUTROABS  --  12.4*  --   --   --   HGB 8.5* 8.6* 8.8* 8.7* 8.9*  HCT 26.6* 26.1* 27.4* 27.2* 27.9*  MCV 94.0 93.5 93.2 94.8 93.6  PLT 819* 839* 932* 840* 933*    CBG:  Recent Labs Lab 09/24/14 2235 09/25/14 0606 09/25/14 1417 09/25/14 2202 09/26/14 0535  GLUCAP 153* 146* 146* 161* 145*    Recent Results (from the past 240 hour(s))  Wound culture     Status: None   Collection Time: 09/18/14 10:14 AM  Result Value Ref Range Status   Specimen Description ABDOMEN  Final   Special Requests Immunocompromised  Final   Gram Stain   Final    ABUNDANT WBC PRESENT,BOTH PMN AND MONONUCLEAR NO SQUAMOUS  EPITHELIAL CELLS SEEN FEW GRAM POSITIVE COCCI IN PAIRS [facility]    Culture   Final    MULTIPLE ORGANISMS PRESENT, NONE PREDOMINANT Note: NO STAPHYLOCOCCUS AUREUS ISOLATED NO GROUP A STREP (S.PYOGENES) ISOLATED Performed at [facility]    Report Status 09/20/2014 FINAL  Final     Scheduled Meds: . acetaminophen  650 mg Oral TID  . becaplermin   Topical Q7 days  . citalopram  20 mg Oral Daily  . feeding supplement (PRO-STAT SUGAR FREE 64)  30 mL Oral BID WC  . heparin subcutaneous  5,000 Units Subcutaneous 3 times per day  . insulin aspart  0-15 Units Subcutaneous 3 times per day  . lip balm  1 application Topical BID  . mesalamine  1,000 mg Rectal QHS  . metoprolol tartrate  25 mg Oral BID  . multivitamins with iron  1 tablet Oral Q supper  . pantoprazole  40 mg Oral Daily  . piperacillin-tazobactam (ZOSYN)  IV  3.375 g Intravenous 3 times per day  . saccharomyces boulardii  250 mg Oral BID  . sodium chloride  10-40 mL Intracatheter Q12H  . sodium hypochlorite   Topical TID   Continuous Infusions: . sodium chloride 10 mL/hr at 09/21/14 1954  . Marland Kitchen.TPN (CLINIMIX-E) Adult 60 mL/hr at 09/25/14 1711   And  . fat emulsion 250 mL (09/25/14 1711)  . Marland Kitchen.TPN (CLINIMIX-E) Adult     And  . fat emulsion

## 2014-09-27 LAB — CBC
HCT: 27.6 % — ABNORMAL LOW (ref 39.0–52.0)
Hemoglobin: 8.8 g/dL — ABNORMAL LOW (ref 13.0–17.0)
MCH: 30.2 pg (ref 26.0–34.0)
MCHC: 31.9 g/dL (ref 30.0–36.0)
MCV: 94.8 fL (ref 78.0–100.0)
Platelets: 798 10*3/uL — ABNORMAL HIGH (ref 150–400)
RBC: 2.91 MIL/uL — ABNORMAL LOW (ref 4.22–5.81)
RDW: 15.1 % (ref 11.5–15.5)
WBC: 18.8 10*3/uL — ABNORMAL HIGH (ref 4.0–10.5)

## 2014-09-27 LAB — BASIC METABOLIC PANEL
Anion gap: 11 (ref 5–15)
BUN: 20 mg/dL (ref 6–23)
CALCIUM: 8.8 mg/dL (ref 8.4–10.5)
CO2: 25 mEq/L (ref 19–32)
Chloride: 98 mEq/L (ref 96–112)
Creatinine, Ser: 0.73 mg/dL (ref 0.50–1.35)
GFR calc Af Amer: >90 mL/min (ref >90)
GLUCOSE: 149 mg/dL — AB (ref 70–99)
Potassium: 4.6 mEq/L (ref 3.7–5.3)
Sodium: 134 mEq/L — ABNORMAL LOW (ref 137–147)

## 2014-09-27 LAB — GLUCOSE, CAPILLARY
Glucose-Capillary: 144 mg/dL — ABNORMAL HIGH (ref 70–99)
Glucose-Capillary: 156 mg/dL — ABNORMAL HIGH (ref 70–99)

## 2014-09-27 MED ORDER — FERROUS SULFATE 325 (65 FE) MG PO TABS
325.0000 mg | ORAL_TABLET | Freq: Every day | ORAL | Status: DC
  Administered 2014-09-28 – 2014-10-01 (×4): 325 mg via ORAL
  Filled 2014-09-27 (×4): qty 1

## 2014-09-27 MED ORDER — TAB-A-VITE/IRON PO TABS
1.0000 | ORAL_TABLET | Freq: Every day | ORAL | Status: DC
  Administered 2014-09-28 – 2014-09-30 (×3): 1 via ORAL
  Filled 2014-09-27 (×4): qty 1

## 2014-09-27 MED ORDER — TAB-A-VITE/IRON PO TABS: 1.0000 | ORAL_TABLET | Freq: Two times a day (BID) | ORAL | Status: DC

## 2014-09-27 NOTE — Progress Notes (Signed)
CSW continues to follow to assist with d/c planning. PN reviewed. Possible weaning / discontinuing TPN. D/C date undetermined. Pt remains medically unstable. [facility] and updated [facility]s provided. [facility] no [facility] through the beginning of next week. [facility] may be able to assist. Both SNF's will be contacted again on Monday and medical updates will be provided . If appropriate, CSW will request the facility, with an opening, to [facility]  [facility]. CSW has spoken to pt's daughter, [name] ( [phone] ), on 11/19 to review d/c plan. CSW will continue to follow to assist with d/c planning to SNF.  Cori RazorJamie Eliott Amparan LCSW [phone]

## 2014-09-27 NOTE — Progress Notes (Addendum)
17 Days Post-Op  Subjective: He continues to make progress.  Objective: Vital signs in last 24 hours: Temp:  [98 F (36.7 C)-98.6 F (37 C)] 98 F (36.7 C) (11/20 1020) Pulse Rate:  [75-110] 110 (11/20 1020) Resp:  [18] 18 (11/20 1020) BP: (102-146)/(56-81) 146/81 mmHg (11/20 1020) SpO2:  [94 %-99 %] 94 % (11/20 1020) Last BM Date: 09/26/14 1560 PO recorded 525 from ileostomy also recorded He is taking 85% of calorie needs and 63% of protein needs orally.   Soft diet Afebrile, VSS Labs show WBC is still up, but other labs are stable. Intake/Output from previous day: 11/19 0701 - 11/20 0700 In: 1790 [P.O.:1560; I.V.:80; IV Piggyback:150] Out: 1855 [Urine:1325; Drains:5; Stool:525] Intake/Output this shift: Total I/O In: 1689.7 [P.O.:236; I.V.:200; IV Piggyback:150; TPN:1103.7] Out: 575 [Urine:475; Stool:100]  General appearance: alert, cooperative and no distress Resp: clear to auscultation bilaterally and anterior GI: soft, open wound is showing some improvement, slowly.  + BS, ostomy working well.  Lab Results:   Recent Labs  09/26/14 0542 09/27/14 0528  WBC 18.9* 18.8*  HGB 8.9* 8.8*  HCT 27.9* 27.6*  PLT 933* 798*    BMET  Recent Labs  09/26/14 0542 09/27/14 0528  NA 133* 134*  K 4.7 4.6  CL 98 98  CO2 25 25  GLUCOSE 147* 149*  BUN 19 20  CREATININE 0.80 0.73  CALCIUM 8.6 8.8   PT/INR No results for input(s): LABPROT, INR in the last 72 hours.   Recent Labs Lab 09/23/14 0509 09/26/14 0542  AST 21 17  ALT 20 26  ALKPHOS 114 95  BILITOT 0.3 0.3  PROT 6.4 6.5  ALBUMIN 1.8* 1.9*     Lipase     Component Value Date/Time   LIPASE 22.0 03/03/2007 0941     Studies/Results: No results found.  Medications: . acetaminophen  650 mg Oral TID  . becaplermin   Topical Q7 days  . citalopram  20 mg Oral Daily  . feeding supplement (RESOURCE BREEZE)  1 Container Oral BID BM  . heparin subcutaneous  5,000 Units Subcutaneous 3 times per day   . insulin aspart  0-15 Units Subcutaneous 3 times per day  . lip balm  1 application Topical BID  . mesalamine  1,000 mg Rectal QHS  . metoprolol tartrate  25 mg Oral BID  . multivitamins with iron  1 tablet Oral Q supper  . pantoprazole  40 mg Oral Daily  . piperacillin-tazobactam (ZOSYN)  IV  3.375 g Intravenous 3 times per day  . saccharomyces boulardii  250 mg Oral BID  . 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 Leone PayorGessner - 09/04/2014 [note that this stricture was documented in 2011 on a colonoscopy attempt. S/p Total abdominal colectomy with end ileostomy, Dr. Harden MoMatt Wakefield, 09/10/14. Concern for stump leak, based on drainage from the JP/CT findings: 4.2 x 4.4 x 4.8 cm focal collection of debris and gas in the right central mesentery 2. Anemia - Hgb - 10/6 - 09/07/2014 transfused 09/14/14 3. Malnutrition - Prealbumin- 12.7 On TNA 4. DM 5. CAD 6. Chronic History of A. Fib rate in the 80's. 7. Very hard of hearing 8. DVT prophylaxis - on no chemoprophylaxis due to UC 9. Rectal bleeding on heparin last 11/10-11/15 (Heparin is off again for rectal bleeding)   Plan:  Stop TNA, regular diet, still worried about elevated WBC. He is on day 7 of Zosyn.  Last  CT on 11/16 shows some mesentery abscesses which we placed him on antibiotics for.  We will recheck Ct early next week.  He is making progress with OT and PT.  Recheck labs on Monday. He drained a large amount of purulent fluid from his rectum late today.  He has had some blood, but not purulent drainage before.  ? Abscess draining from his rectal stump?  I will recheck cbc in AM see if it is better.   LOS: 25 days    Cieara Stierwalt 09/27/2014

## 2014-09-27 NOTE — Progress Notes (Signed)
Physical Therapy Treatment Patient Details Name: Ronald QuarryWilliam M Schnabel MRN: [id number] DOB: 03/12/1941 Today's Date: 09/27/2014    History of Present Illness 73 y.o. male with h/o ulcerative colitis, DM, CAD, a fib, chronic diarrhea, chronic LLE wound admitted with bloody stools, abdominal pain and significant weight loss. Dx of colon stricture. 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 PT Re-eval-09/11/14    PT Comments    Pt fatigued quickly this session and reports not feeling well.  Ambulated around room twice with seated rest break and assessed vitals during rest breaks (see below).  Continue to recommend SNF.  Follow Up Recommendations  SNF     Equipment Recommendations  None recommended by PT    Recommendations for Other Services       Precautions / Restrictions Precautions Precautions: Fall Precaution Comments: chronic wound L heel, pt wears compression stocking, L abdominal drain, HOH, check sats    Mobility  Bed Mobility Overal bed mobility: Needs Assistance Bed Mobility: Supine to Sit;Sit to Supine     Supine to sit: Supervision Sit to supine: Supervision   General bed mobility comments: assist for lines/drain  Transfers Overall transfer level: Needs assistance Equipment used: Rolling walker (2 wheeled) Transfers: Sit to/from Stand Sit to Stand: Supervision         General transfer comment: supervision for safety as pt reports not feeling well  Ambulation/Gait Ambulation/Gait assistance: Min guard Ambulation Distance (Feet): 16 Feet (x2) Assistive device: Rolling walker (2 wheeled) Gait Pattern/deviations: Step-to pattern Gait velocity: decr   General Gait Details: pt leads with L LE, reports not feeling well so ambulated around room and then seated rest break on bed with vitals taken and then ambulated once around bed prior to return to room (pt with increased purulent looking drainage in supine prior to getting OOB so RN requested in room only today  in case of more drainage with mobility)   Stairs            Wheelchair Mobility    Modified Rankin (Stroke Patients Only)       Balance                                    Cognition Arousal/Alertness: Awake/alert Behavior During Therapy: WFL for tasks assessed/performed Overall Cognitive Status: Within Functional Limits for tasks assessed                      Exercises      General Comments        Pertinent Vitals/Pain Pain Assessment: No/denies pain  Vitals after first round ambulation: 149/73 mmHg, SpO2 97% room air, 101 bpm   Vitals taken again after second round ambulation and reported in docflowsheets by NT (very similar to above and temp 98*F)    Home Living                      Prior Function            PT Goals (current goals can now be found in the care plan section) Progress towards PT goals: Progressing toward goals    Frequency  Min 3X/week    PT Plan Current plan remains appropriate    Co-evaluation             End of Session   Activity Tolerance: Patient tolerated treatment well Patient left: in bed;with call bell/phone within reach;with bed  alarm set;with nursing/sitter in room     Time: [phone] PT Time Calculation (min) (ACUTE ONLY): 30 min  Charges:  $Gait Training: 8-22 mins $Therapeutic Activity: 8-22 mins                    G Codes:      Kahari Critzer,KATHrine E 09/27/2014, 12:34 PM Zenovia JarredKati Ahnesty Finfrock, PT, DPT 09/27/2014 Pager: [phone]

## 2014-09-27 NOTE — Progress Notes (Signed)
Patient ID: Ronald Mayo, male   DOB: February 01, 1941, 73 y.o.   MRN: [id number] [facility] PROGRESS NOTE  JRU PENSE WUJ:[id number] DOB: 1941/07/30 DOA: 09/02/2014 PCP: Aniceto Boss, MD  Brief narrative: 73 year old male with ulcerative colitis on sulfasalazine, DM type II (on Amaryl), presented 10/26 with main concern of several weeks duration or persistent diarrhea and more recently blood in stool. This has been associated with poor oral intake, malaise, weight loss, subjective fevers, chills, intermittent abd cramping, non radiation, with no specific alleviating or aggravating factors. Ct abd and pelvis c/w active UC. 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 Dwain Sarna 11/03. Now with concern for post op stump leak, based on drainage from the JP/CT findings: 4.2 x 4.4 x 4.8 cm focal collection of debris and gas in the right central mesentery. Due to complex chronic medical conditions imposed on new acute complications noted below, will change attending team to [facility]. Discussed with surgery team.   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 Leone Payor - 09/04/2014  - pt is s/p total abdominal colectomy with end ileostomy by Dr. Dwain Sarna 11/03 - concern of post op stump leak, based on drainage from the JP/CT findings: 4.2 x 4.4 x 4.8 cm focal collection of debris and gas in the right central mesentery - continue Zosyn day #12 for mesenteric abscess. Leukocytosis persistent (WBC count 18.4 - 18.9 range) - repeat CT abd 11/16 with new small abscess, no surgical intervention required  - appreciate surgery following  - continue Mesalamine supp per GI - wean TPN  Active Problems: Acute encephalopathy  - had worsening mental status 09/23/2014 but this seems to be improving  - PCA Dilaudid stopped 11/15 as it could have contributed to  confusion; pt is currently on dilaudid 1 mg every 4 hours IV PRN severe pain  Acute on chronic blood loss anemia, UC with post op bleed - Hemoglobin ranges 8.7 - 8.9; stable; no reports of bleeding  - pt is at high risk for DVT, started on prophylactic Lovenox 11/14, changed to Heparin SQ 11/15 Atrial fib - rate controlled with metoprolol 25 mg PO BID - not a candidate for anticoagulation due to multiple acute issue per cardiology; appreciate cardiology recommendations   DM type II - pt at home on Amaryl, on hold  - continue only SSI for now Protein-calorie malnutrition, severe  - secondary to progressive nature of UC and acute illness  - NGT out 11/14 - now weaning TPN  Chronic venous insufficiency ulcerations, left medial malleolus, left heel. - Measurement: medial malleolar ulceration has healed. Left heel ulcer measures 0.5cm x 0.8cm, x 0.4cm - Wound NFA:OZHYQ, pink, moist, mild drainage serous  - recommend regranex applied following cleanse with NS, and pat dry. This is topped with a soft silicone foam dressing - appreciate wound care team assessment  Ostomy  - RUQ ileostomy, 1 and 5/8 inches round, red, slightly budded - appreciate wound care team help  Acute functional quadriplegia - need SNF placement; appreciate SW assisting discharge plan   DVT prophylaxis  Heparin SQ, started 11/15  Code Status: Full Family Communication: No family at bedside  Disposition Plan: Remains inpatient, SNF once medically stable    IV Access:   PICC line   Procedures and diagnostic studies:     Ct Abdomen Pelvis W Contrast 09/16/2014 4.2 x 4.4 x 4.8  cm focal collection of debris and gas in the right central mesentery, adjacent to the superior mesenteric vein. This particular location has a different appearance than any of the other fluid seen in the abdomen or pelvis and given the history of leukocytosis, raises concern for evolving abscess, although it does not have a well-defined  or enhancing rim at this time. Status post subtotal colectomy with right abdominal end ileostomy. No evidence for bowel obstruction. There is a small amount of ascites adjacent to the liver and in the right paracolic gutter and subtle peritoneal enhancement is seen along some of the fluid collections although they do not appear to be well organized at this time. A very small amount of intraperitoneal free air is associated with free air in the rectus sheath and anterior abdominal wall, not unexpected 6 days out from surgery.  Ct Abdomen Pelvis W Contrast 09/04/2014 Left-sided colitis, likely representing active ulcerative colitis. Infectious could look similar. Ischemia felt unlikely, given distribution and clinical history.Atherosclerosis, coronary arteries   Dg Chest Port 1 View 09/12/2014 PICC line tip in the right atrium.   Dg Chest Port 1 View 09/06/2014 Right PICC line placed, tip at the SVC level. No acute cardiopulmonary abnormality.  Medical Consultants:    Surgery  Cardiology -- signed off  GI -- signed off  Other Consultants:    PT - recommend SNF upon discharge  OT - recommend SNF upon discharge  Wound care team  Anti-Infectives:   Zosyn 11/09 -->  Debbora PrestoMAGICK-Areej Tayler, MD  [facility] Pager [phone]  If 7PM-7AM, please contact night-coverage [email or web address] Password [email or web address] 09/27/2014, 10:59 AM   LOS: 25 days   HPI/Subjective: No events overnight.   Objective: Filed Vitals:   09/26/14 1400 09/26/14 2136 09/27/14 0545 09/27/14 1020  BP: 128/63 102/56 115/58 146/81  Pulse: 85 95 75 110  Temp: 98.3 F (36.8 C) 98.6 F (37 C) 98.1 F (36.7 C) 98 F (36.7 C)  TempSrc: Oral Oral Oral Oral  Resp: 18 18 18 18   Height:      Weight:      SpO2: 99% 98% 98% 94%    Intake/Output Summary (Last 24 hours) at 09/27/14 1059 Last data filed at 09/27/14 1020  Gross per 24 hour  Intake 2929.67 ml  Output   2230 ml  Net 699.67 ml    Exam:   General: Pt is not  in acute distress  Cardiovascular: Regular rate and rhythm, S1/S2 appreciated   Respiratory: bilateral air entry, no wheezing, no crackles, no rhonchi  Abdomen: Soft, non tender, bowel sounds present  Data Reviewed: Basic Metabolic Panel:  Recent Labs Lab 09/22/14 0340 09/23/14 0509 09/24/14 0445 09/26/14 0542 09/27/14 0528  NA 137 137 138 133* 134*  K 4.3 4.2 4.3 4.7 4.6  CL 98 100 101 98 98  CO2 28 26 23 25 25   GLUCOSE 92 130* 145* 147* 149*  BUN 17 16 19 19 20   CREATININE 0.70 0.79 0.73 0.80 0.73  CALCIUM 8.7 9.0 8.8 8.6 8.8  MG 2.1 2.1  --  2.0  --   PHOS 4.3 4.0  --  3.8  --    Liver Function Tests:  Recent Labs Lab 09/23/14 0509 09/26/14 0542  AST 21 17  ALT 20 26  ALKPHOS 114 95  BILITOT 0.3 0.3  PROT 6.4 6.5  ALBUMIN 1.8* 1.9*   No results for input(s): LIPASE, AMYLASE in the last 168 hours. No results for input(s): AMMONIA in the last 168 hours. CBC:  Recent Labs Lab 09/23/14 0509 09/24/14 0445 09/25/14 0624 09/26/14 0542 09/27/14 0528  WBC 16.9* 18.5* 18.4* 18.9* 18.8*  NEUTROABS 12.4*  --   --   --   --   HGB 8.6* 8.8* 8.7* 8.9* 8.8*  HCT 26.1* 27.4* 27.2* 27.9* 27.6*  MCV 93.5 93.2 94.8 93.6 94.8  PLT 839* 932* 840* 933* 798*   Cardiac Enzymes: No results for input(s): CKTOTAL, CKMB, CKMBINDEX, TROPONINI in the last 168 hours. BNP: Invalid input(s): POCBNP CBG:  Recent Labs Lab 09/25/14 2202 09/26/14 0535 09/26/14 1355 09/26/14 2124 09/27/14 0554  GLUCAP 161* 145* 185* 174* 144*    Recent Results (from the past 240 hour(s))  Wound culture     Status: None   Collection Time: 09/18/14 10:14 AM  Result Value Ref Range Status   Specimen Description ABDOMEN  Final   Special Requests Immunocompromised  Final   Gram Stain   Final    ABUNDANT WBC PRESENT,BOTH PMN AND MONONUCLEAR NO SQUAMOUS EPITHELIAL CELLS SEEN FEW GRAM POSITIVE COCCI IN PAIRS Performed at [facility]    Culture   Final    MULTIPLE ORGANISMS  PRESENT, NONE PREDOMINANT Note: NO STAPHYLOCOCCUS AUREUS ISOLATED NO GROUP A STREP (S.PYOGENES) ISOLATED Performed at [facility]    Report Status 09/20/2014 FINAL  Final     Scheduled Meds: . acetaminophen  650 mg Oral TID  . becaplermin   Topical Q7 days  . citalopram  20 mg Oral Daily  . feeding supplement   1 Container Oral BID BM  . heparin subcutaneous  5,000 Units Subcutaneous 3 times per day  . insulin aspart  0-15 Units Subcutaneous 3 times per day  . mesalamine  1,000 mg Rectal QHS  . metoprolol tartrate  25 mg Oral BID  . multivitamins with iron  1 tablet Oral Q supper  . pantoprazole  40 mg Oral Daily  . piperacillin-tazobactam   3.375 g Intravenous 3 times per day  . saccharomyces bou  250 mg Oral BID   Continuous Infusions: . sodium chloride 10 mL/hr at 09/21/14 1954  . Marland Kitchen.TPN (CLINIMIX-E) Adult 60 mL/hr at 09/26/14 1814   And  . fat emulsion 250 mL (09/26/14 1814)

## 2014-09-27 NOTE — Consult Note (Signed)
WOC ostomy follow up Stoma type/location: RUQ Stomal assessment/size: 1 and 5/8 inches Peristomal assessment: Not seen today.  Pouching system intact Treatment options for stomal/peristomal skin: Skin barrier ring Output brown stool Ostomy pouching: 1pc.convex pouching system with skin barrier ring Education provided: patient is much clearer than at the time of my last visit, but I am still unsure that he will attain independence with ostomy care. Takes a very long time to fasten tail closure of pouching system, for example and required much cueing. [place] [facility] [place] Dis[place]: Yes (today) WOC nursing team will continue to follow, and will remain available to this patient, the nursing and medical teams. Thanks, Ladona MowLaurie Clinton Wahlberg, MSN, RN, GNP, [place], CWON-AP [phone])

## 2014-09-28 LAB — CBC
HEMATOCRIT: 27.5 % — AB (ref 39.0–52.0)
Hemoglobin: 8.9 g/dL — ABNORMAL LOW (ref 13.0–17.0)
MCH: 30.3 pg (ref 26.0–34.0)
MCHC: 32.4 g/dL (ref 30.0–36.0)
MCV: 93.5 fL (ref 78.0–100.0)
PLATELETS: 820 10*3/uL — AB (ref 150–400)
RBC: 2.94 MIL/uL — ABNORMAL LOW (ref 4.22–5.81)
RDW: 15.1 % (ref 11.5–15.5)
WBC: 15.5 10*3/uL — AB (ref 4.0–10.5)

## 2014-09-28 NOTE — Plan of Care (Signed)
Problem: Acute Rehab OT Goals (only OT should resolve) Goal: Pt. Will Perform Grooming Outcome: Completed/Met Date Met:  09/28/14 Goal: Pt. Will Perform Upper Body Bathing Outcome: Completed/Met Date Met:  09/28/14 Goal: Pt. Will Perform Lower Body Bathing Outcome: Progressing Goal: OT Additional ADL Goal #1 Outcome: Completed/Met Date Met:  09/28/14

## 2014-09-28 NOTE — Progress Notes (Signed)
Patient ID: Ronald QuarryWilliam M Mayo, male   DOB: 1941/06/11, 73 y.o.   MRN: [id number] 18 Days Post-Op  Subjective: No complaints this morning. Has some occasional incisional pain that is stable. Nurse reports some light greenish drainage from his rectal stump this morning 1. He is eating well and Ileostomy functioning  Objective: Vital signs in last 24 hours: Temp:  [97.8 F (36.6 C)-98.1 F (36.7 C)] 98 F (36.7 C) (11/21 0540) Pulse Rate:  [62-83] 78 (11/21 1039) Resp:  [18] 18 (11/21 0540) BP: (100-125)/(58-69) 125/61 mmHg (11/21 1039) SpO2:  [98 %-100 %] 100 % (11/21 0540) Last BM Date: 09/27/14  Intake/Output from previous day: 11/20 0701 - 11/21 0700 In: 2329.7 [P.O.:296; I.V.:400; IV Piggyback:250; TPN:1383.7] Out: 1485 [Urine:1225; Drains:10; Stool:250] Intake/Output this shift: Total I/O In: 410 [P.O.:360; IV Piggyback:50] Out: 650 [Urine:650]  General appearance: alert, cooperative and no distress GI: normal findings: soft, non-tender and nondistended Incision/Wound: dressing changed with nurse. Some  areas of necrotic fascia at the base but increased granulation tissue overall without unusual drainage  Lab Results:   Recent Labs  09/27/14 0528 09/28/14 0615  WBC 18.8* 15.5*  HGB 8.8* 8.9*  HCT 27.6* 27.5*  PLT 798* 820*   BMET  Recent Labs  09/26/14 0542 09/27/14 0528  NA 133* 134*  K 4.7 4.6  CL 98 98  CO2 25 25  GLUCOSE 147* 149*  BUN 19 20  CREATININE 0.80 0.73  CALCIUM 8.6 8.8     Studies/Results: No results found.  Anti-infectives: Anti-infectives    Start     Dose/Rate Route Frequency Ordered Stop   09/16/14 1630  piperacillin-tazobactam (ZOSYN) IVPB 3.375 g     3.375 g12.5 mL/hr over 240 Minutes Intravenous 3 times per day 09/16/14 1620     09/10/14 2200  cefoTEtan (CEFOTAN) 2 g in dextrose 5 % 50 mL IVPB     2 g100 mL/hr over 30 Minutes Intravenous Every 12 hours 09/10/14 1644 09/11/14 1130   09/10/14 0600  cefoTEtan (CEFOTAN) 2 g in  dextrose 5 % 50 mL IVPB     2 g100 mL/hr over 30 Minutes Intravenous On call to O.R. 09/09/14 1321 09/10/14 1142      Assessment/Plan: 1.Sigmoid stricture - on colonoscopy by Dr. Leone PayorGessner - 09/04/2014 [note that this stricture was documented in 2011 on a colonoscopy attempt. S/p Total abdominal colectomy with end ileostomy, Dr. Harden MoMatt Wakefield, 09/10/14. Concern for stump leak, based on drainage from the JP/CT findings: 4.2 x 4.4 x 4.8 cm focal collection of debris and gas in the right central mesentery 2. Anemia - Hgb - 10/6 - 09/07/2014 transfused 09/14/14 3. Malnutrition - Prealbumin- 12.7 On TNA 4. DM 5. CAD 6. Chronic History of A. Fib rate in the 80's. 7. Very hard of hearing 8. DVT prophylaxis - on no chemoprophylaxis due to UC 9. Rectal bleeding on heparin last 11/10-11/15 (Heparin is off again for rectal bleeding)  Overall seems to be stable/improving. White blood count decreased. Having some rectal drainage which could be related to stump disruption     LOS: 26 days    Ronald Mayo T 09/28/2014

## 2014-09-28 NOTE — Plan of Care (Signed)
Problem: Discharge Progression Outcomes Goal: Pain controlled with appropriate interventions Outcome: Progressing Pt prefers relief provided by IV Dilaudid. Goal: Tolerating diet Outcome: Progressing Good appetite, but pt reports he fills up quickly.  Problem: Phase I Progression Outcomes Goal: Sutures/staples intact Outcome: Not Applicable Date Met:  90/24/09  Problem: Phase II Progression Outcomes Goal: Pain controlled Outcome: Completed/Met Date Met:  09/28/14 Goal: Progressing with IS, TCDB Outcome: Progressing Requires cues to slow down breaths; able to significantly increase IS number today with cues. Goal: Dressings dry/intact Outcome: Progressing Increased granulation tissue since this nurse had pt one week ago. Significantly decreased drainage. Goal: Sutures/staples intact Outcome: Not Applicable Date Met:  73/53/29 Goal: Tolerating diet Outcome: Progressing Pt reports good appetite, but says he fills up quickly.

## 2014-09-28 NOTE — Progress Notes (Signed)
Occupational Therapy Treatment Patient Details Name: Ronald Mayo MRN: [id number] DOB: 08/17/1941 Today's Date: 09/28/2014    History of present illness 73 y.o. male with h/o ulcerative colitis, DM, CAD, a fib, chronic diarrhea, chronic LLE wound admitted with bloody stools, abdominal pain and significant weight loss. Dx of colon stricture. 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 PT Re-eval-09/11/14   OT comments  Making excellent progress. Has met 3/4 goals. Increasing participation with ADL and able to increase endurance with mobility by ambulating @ 120 ft with 2 rest breaks. Vitals WNL. Family assisted in session and pleased with progress. Continue to recommend SNF for rehab to return to PLOF. Goals updated in care plan.   Follow Up Recommendations  SNF    Equipment Recommendations  Other (comment) (TBA at SNF)    Recommendations for Other Services      Precautions / Restrictions Precautions Precautions: Fall Precaution Comments: chronic wound L heel, pt wears compression stocking, L abdominal drain, HOH, check sats       Mobility Bed Mobility Overal bed mobility: Needs Assistance Bed Mobility: Supine to Sit Rolling: Supervision            Transfers Overall transfer level: Needs assistance Equipment used: Rolling walker (2 wheeled) Transfers: Sit to/from Stand;Stand Pivot Transfers Sit to Stand: Supervision Stand pivot transfers: Supervision            Balance Overall balance assessment: Needs assistance   Sitting balance-Leahy Scale: Good     Standing balance support: During functional activity;Bilateral upper extremity supported Standing balance-Leahy Scale: Fair                     ADL Overall ADL's : Needs assistance/impaired     Grooming: Set up;Wash/dry hands;Wash/dry face;Brushing hair;Standing (at sink)                 Lower Body Dressing Details (indicate cue type and reason): assistance to donn socks          Ambulated @ 120 ft with 2 rest breaks @ RW level. dyspnea 2/4. Increasing endurance for mobility.    Functional mobility during ADLs: Minimal assistance;Rolling walker;Cueing for safety;Cueing for sequencing        Vision                     Perception     Praxis      Cognition   Behavior During Therapy: WFL for tasks assessed/performed                    General Comments: decreased safety/judgement    Extremity/Trunk Assessment               Exercises Other Exercises Other Exercises: encouraged BUE/LE AROM while OOB in chair   Shoulder Instructions       General Comments      Pertinent Vitals/ Pain       Pain Assessment: No/denies pain  Home Living                                          Prior Functioning/Environment              Frequency Min 2X/week     Progress Toward Goals  OT Goals(current goals can now be found in the care plan section)  Progress towards OT goals: Goals met and updated -   see care plan  Acute Rehab OT Goals Patient Stated Goal: return to baseline OT Goal Formulation: With patient Time For Goal Achievement: 10/12/14 Potential to Achieve Goals: Good ADL Goals Pt Will Perform Grooming: with min assist;standing (met) Pt Will Perform Upper Body Bathing: with min guard assist;sitting (met) Additional ADL Goal #1: pt will perform bed mobility with min A in preparation for adls (met)  Plan Discharge plan remains appropriate    Co-evaluation                 End of Session Equipment Utilized During Treatment: Gait belt;Rolling walker   Activity Tolerance Patient tolerated treatment well   Patient Left in chair;with call bell/phone within reach;with chair alarm set;with family/visitor present   Nurse Communication Mobility status        Time: 1740-8144 OT Time Calculation (min): 24 min  Charges: OT General Charges $OT Visit: 1 Procedure OT Treatments $Self Care/Home  Management : 8-22 mins $Therapeutic Activity: 8-22 mins  Janiesha Diehl,HILLARY 09/28/2014, 1:30 PM   [facility], OTR/L  [phone] 09/28/2014

## 2014-09-28 NOTE — Progress Notes (Signed)
Patient ID: Ronald Mayo Hartmann, male   DOB: 12-Dec-1940, 73 y.o.   MRN: [id number]  [facility] PROGRESS NOTE  Ronald Mayo Brunner WUJ:[id number] DOB: 12-Dec-1940 DOA: 09/02/2014 PCP: Aniceto BossSETTLE,PAUL C, MD  Brief narrative: 73 year old male with ulcerative colitis on sulfasalazine, DM type II (on Amaryl), presented 10/26 with main concern of several weeks duration or persistent diarrhea and more recently blood in stool. This has been associated with poor oral intake, malaise, weight loss, subjective fevers, chills, intermittent abd cramping, non radiation, with no specific alleviating or aggravating factors. Ct abd and pelvis c/w active UC. 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 Dwain SarnaWakefield 11/03. Now with concern for post op stump leak, based on drainage from the JP/CT findings: 4.2 x 4.4 x 4.8 cm focal collection of debris and gas in the right central mesentery.   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 Leone PayorGessner - 09/04/2014  - pt is s/p total abdominal colectomy with end ileostomy by Dr. Dwain SarnaWakefield 11/03 - concern of post op stump leak, based on drainage from the JP/CT findings: 4.2 x 4.4 x 4.8 cm focal collection of debris and gas in the right central mesentery - continue Zosyn day #13 for mesenteric abscess. Leukocytosis persistent (WBC count 18.4 - 18.9 range) - repeat CT abd 11/16 with new small abscess, no surgical intervention required  - appreciate surgery following  - continue Mesalamine supp per GI - off TPN, tolerating current diet well and reports feeling better this AM  Active Problems: Acute encephalopathy  - had worsening mental status 09/23/2014 but this seems to be improving  - PCA Dilaudid stopped 11/15 as it could have contributed to confusion; pt is currently on dilaudid 1 mg every 4 hours IV PRN severe pain  -  mental status clear this AM 11/21 Acute on chronic blood loss anemia, UC with post op bleed - Hemoglobin ranges 8.7 - 8.9; stable; no reports of bleeding  - pt is at high risk for DVT, started on prophylactic Lovenox 11/14, changed to Heparin SQ 11/15 Atrial fib - rate controlled with metoprolol 25 mg PO BID - not a candidate for anticoagulation due to multiple acute issue per cardiology; appreciate cardiology recommendations  DM type II - pt at home on Amaryl, on hold  - continue only SSI for now Protein-calorie malnutrition, severe  - secondary to progressive nature of UC and acute illness  - NGT out 11/14 - off TPN since11/20 Chronic venous insufficiency ulcerations, left medial malleolus, left heel. - Measurement: medial malleolar ulceration has healed. Left heel ulcer measures 0.5cm x 0.8cm, x 0.4cm - Wound NFA:OZHYQbed:clean, pink, moist, mild drainage serous  - recommend regranex applied following cleanse with NS, and pat dry. This is topped with a soft silicone foam dressing - appreciate wound care team assessment  Ostomy  - RUQ ileostomy, 1 and 5/8 inches round, red, slightly budded - appreciate wound care team help  Acute functional quadriplegia - need SNF placement; appreciate SW assisting discharge plan   DVT prophylaxis  Heparin SQ, started 11/15  Code Status: Full Family Communication: No family at bedside  Disposition Plan: Remains inpatient, SNF hopefully Monday if OK with surgery team    IV Access:    PICC line  Procedures and diagnostic studies:    Ct Abdomen Pelvis W Contrast 09/16/2014 4.2 x 4.4 x 4.8 cm focal collection of  debris and gas in the right central mesentery, adjacent to the superior mesenteric vein. This particular location has a different appearance than any of the other fluid seen in the abdomen or pelvis and given the history of leukocytosis, raises concern for evolving abscess, although it does not have a well-defined or enhancing  rim at this time. Status post subtotal colectomy with right abdominal end ileostomy. No evidence for bowel obstruction. There is a small amount of ascites adjacent to the liver and in the right paracolic gutter and subtle peritoneal enhancement is seen along some of the fluid collections although they do not appear to be well organized at this time. A very small amount of intraperitoneal free air is associated with free air in the rectus sheath and anterior abdominal wall, not unexpected 6 days out from surgery.  Ct Abdomen Pelvis W Contrast 09/04/2014 Left-sided colitis, likely representing active ulcerative colitis. Infectious could look similar. Ischemia felt unlikely, given distribution and clinical history.Atherosclerosis, coronary arteries   Dg Chest Port 1 View 09/12/2014 PICC line tip in the right atrium.   Dg Chest Port 1 View 09/06/2014 Right PICC line placed, tip at the SVC level. No acute cardiopulmonary abnormality.  Medical Consultants:    Surgery  Cardiology -- signed off  GI -- signed off  Other Consultants:    PT - recommend SNF upon discharge  OT - recommend SNF upon discharge  Wound care team  Anti-Infectives:    Zosyn 11/09 -->   Debbora PrestoMAGICK-Tyrann Donaho, MD  [facility] Pager [phone]  If 7PM-7AM, please contact night-coverage [email or web address] Password [email or web address] 09/28/2014, 1:20 PM   LOS: 26 days   HPI/Subjective: No events overnight.   Objective: Filed Vitals:   09/27/14 1359 09/27/14 2120 09/28/14 0540 09/28/14 1039  BP: 118/69 100/61 100/58 125/61  Pulse: 82 83 62 78  Temp: 97.8 F (36.6 C) 98.1 F (36.7 C) 98 F (36.7 C)   TempSrc: Oral Oral Oral   Resp: 18 18 18    Height:      Weight:      SpO2: 100% 98% 100%     Intake/Output Summary (Last 24 hours) at 09/28/14 1320 Last data filed at 09/28/14 1052  Gross per 24 hour  Intake 1092.33 ml  Output   1560 ml  Net -467.67 ml    Exam:   General:  Pt is alert, follows commands  appropriately, not in acute distress  Cardiovascular: Irregular rate and rhythm, , no rubs, no gallops  Respiratory: Clear to auscultation bilaterally, no wheezing, no crackles, no rhonchi  Abdomen: Soft, non tender, non distended  Extremities: No edema, pulses DP and PT palpable bilaterally   Data Reviewed: Basic Metabolic Panel:  Recent Labs Lab 09/22/14 0340 09/23/14 0509 09/24/14 0445 09/26/14 0542 09/27/14 0528  NA 137 137 138 133* 134*  K 4.3 4.2 4.3 4.7 4.6  CL 98 100 101 98 98  CO2 28 26 23 25 25   GLUCOSE 92 130* 145* 147* 149*  BUN 17 16 19 19 20   CREATININE 0.70 0.79 0.73 0.80 0.73  CALCIUM 8.7 9.0 8.8 8.6 8.8  MG 2.1 2.1  --  2.0  --   PHOS 4.3 4.0  --  3.8  --    Liver Function Tests:  Recent Labs Lab 09/23/14 0509 09/26/14 0542  AST 21 17  ALT 20 26  ALKPHOS 114 95  BILITOT 0.3 0.3  PROT 6.4 6.5  ALBUMIN 1.8* 1.9*   CBC:  Recent Labs Lab 09/23/14 0509 09/24/14 0445 09/25/14  2130 09/26/14 0542 09/27/14 0528 09/28/14 0615  WBC 16.9* 18.5* 18.4* 18.9* 18.8* 15.5*  NEUTROABS 12.4*  --   --   --   --   --   HGB 8.6* 8.8* 8.7* 8.9* 8.8* 8.9*  HCT 26.1* 27.4* 27.2* 27.9* 27.6* 27.5*  MCV 93.5 93.2 94.8 93.6 94.8 93.5  PLT 839* 932* 840* 933* 798* 820*   CBG:  Recent Labs Lab 09/26/14 0535 09/26/14 1355 09/26/14 2124 09/27/14 0554 09/27/14 1355  GLUCAP 145* 185* 174* 144* 156*    Scheduled Meds: . acetaminophen  650 mg Oral TID  . becaplermin   Topical Q7 days  . citalopram  20 mg Oral Daily  . feeding supplement (RESOURCE BREEZE)  1 Container Oral BID BM  . ferrous sulfate  325 mg Oral QPC lunch  . heparin subcutaneous  5,000 Units Subcutaneous 3 times per day  . lip balm  1 application Topical BID  . mesalamine  1,000 mg Rectal QHS  . metoprolol tartrate  25 mg Oral BID  . multivitamins with iron  1 tablet Oral QHS  . pantoprazole  40 mg Oral Daily  . piperacillin-tazobactam (ZOSYN)  IV  3.375 g Intravenous 3 times per day   . saccharomyces boulardii  250 mg Oral BID  . sodium chloride  10-40 mL Intracatheter Q12H   Continuous Infusions: . sodium chloride 10 mL/hr at 09/21/14 1954

## 2014-09-29 NOTE — Plan of Care (Signed)
Problem: Phase II Progression Outcomes Goal: Tolerating diet Outcome: Completed/Met Date Met:  09/29/14

## 2014-09-29 NOTE — Plan of Care (Signed)
Problem: Phase II Progression Outcomes Goal: Discharge plan established Outcome: Progressing MD discussed possibility of rehab with pt.  MD/RN will follow up with SW/CM tomorrow.

## 2014-09-29 NOTE — Progress Notes (Signed)
Patient ID: Ronald Mayo, male   DOB: 1941-07-07, 73 y.o.   MRN: [id number]  [facility] PROGRESS NOTE  Ronald Mayo WUJ:[id number] DOB: 1941-07-07 DOA: 09/02/2014 PCP: Aniceto BossSETTLE,PAUL C, MD   Brief narrative: 73 year old male with ulcerative colitis on sulfasalazine, DM type II (on Amaryl), presented 10/26 with main concern of several weeks duration or persistent diarrhea and more recently blood in stool. This has been associated with poor oral intake, malaise, weight loss, subjective fevers, chills, intermittent abd cramping, non radiation, with no specific alleviating or aggravating factors. Ct abd and pelvis c/w active UC. 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 Dwain SarnaWakefield 11/03. Now with concern for post op stump leak, based on drainage from the JP/CT findings: 4.2 x 4.4 x 4.8 cm focal collection of debris and gas in the right central mesentery. Hospital course complicated by overall slow recovery, intermittent episodes of confusion, now better after off Dilaudid PCA, persistent leukocytosis.   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 Leone PayorGessner - 09/04/2014  - pt is s/p total abdominal colectomy with end ileostomy by Dr. Dwain SarnaWakefield 11/03 - concern of post op stump leak, based on drainage from the JP/CT findings: 4.2 x 4.4 x 4.8 cm focal collection of debris and gas in the right central mesentery - continue Zosyn day #14 for mesenteric abscess. Leukocytosis persistent but finally trending down: 18.9 --> 15.5 over the past 24 hours - repeat CT abd 11/16 with new small abscess, no surgical intervention required  - appreciate surgery following, pt not stable for discharge per surgery stand point and may need repeat CT abd  - continue Mesalamine supp per GI - off TPN since 11/20, tolerating current diet well and reports feeling  better this AM  Active Problems: Acute encephalopathy  - had worsening mental status 09/23/2014 but this seems to be improving  - PCA Dilaudid stopped 11/15 as it could have contributed to confusion; pt is currently on dilaudid 1 mg every 4 hours IV PRN severe pain  - mental status clear since 11/21 Acute on chronic blood loss anemia, UC with post op bleed - Hemoglobin ranges 8.7 - 8.9; stable; no reports of bleeding  - pt is at high risk for DVT, started on prophylactic Lovenox 11/14, changed to Heparin SQ 11/15 and Hg remains stable  Atrial fib - rate controlled with metoprolol 25 mg PO BID - not a candidate for anticoagulation due to multiple acute issue per cardiology; appreciate cardiology recommendations  DM type II - pt at home on Amaryl, on hold  - continue only SSI for now Protein-calorie malnutrition, severe  - secondary to progressive nature of UC and acute illness  - NGT out 11/14 - off TPN since11/20 Chronic venous insufficiency ulcerations, left medial malleolus, left heel. - Measurement: medial malleolar ulceration has healed. Left heel ulcer measures 0.5cm x 0.8cm, x 0.4cm - Wound NFA:OZHYQbed:clean, pink, moist, mild drainage serous  - recommend regranex applied following cleanse with NS, and pat dry. This is topped with a soft silicone foam dressing - appreciate wound care team assessment  Ostomy  - RUQ ileostomy, 1 and 5/8 inches round, red, slightly budded - appreciate wound care team help  Acute functional quadriplegia - need SNF placement; appreciate SW assisting discharge plan   DVT prophylaxis  Heparin SQ, started 11/15  Code Status: Full Family Communication: No family at  bedside  Disposition Plan: Remains inpatient, SNF when cleared by surgery team    IV Access:    PICC line  Procedures and diagnostic studies:    Ct Abdomen Pelvis W Contrast 09/16/2014 4.2 x 4.4 x 4.8 cm focal collection of debris and gas in the right central  mesentery, adjacent to the superior mesenteric vein. This particular location has a different appearance than any of the other fluid seen in the abdomen or pelvis and given the history of leukocytosis, raises concern for evolving abscess, although it does not have a well-defined or enhancing rim at this time. Status post subtotal colectomy with right abdominal end ileostomy. No evidence for bowel obstruction. There is a small amount of ascites adjacent to the liver and in the right paracolic gutter and subtle peritoneal enhancement is seen along some of the fluid collections although they do not appear to be well organized at this time. A very small amount of intraperitoneal free air is associated with free air in the rectus sheath and anterior abdominal wall, not unexpected 6 days out from surgery.  Ct Abdomen Pelvis W Contrast 09/04/2014 Left-sided colitis, likely representing active ulcerative colitis. Infectious could look similar. Ischemia felt unlikely, given distribution and clinical history.Atherosclerosis, coronary arteries   Dg Chest Port 1 View 09/12/2014 PICC line tip in the right atrium.   Dg Chest Port 1 View 09/06/2014 Right PICC line placed, tip at the SVC level. No acute cardiopulmonary abnormality.  Medical Consultants:    Surgery  Cardiology -- signed off  GI -- signed off  Other Consultants:    PT - recommend SNF upon discharge  OT - recommend SNF upon discharge  Wound care team  Anti-Infectives:    Zosyn 11/09 -->  Debbora Presto, MD  [facility] Pager [phone]  If 7PM-7AM, please contact night-coverage [email or web address] Password [facility] 09/29/2014, 5:59 PM   LOS: 27 days   HPI/Subjective: No events overnight.   Objective: Filed Vitals:   09/28/14 2200 09/29/14 0600 09/29/14 0908 09/29/14 1424  BP: 112/58 132/58 116/51 139/63  Pulse: 69 63 80 95  Temp: 98.3 F (36.8 C) 98.7 F (37.1 C)  98.8 F (37.1 C)  TempSrc: Oral Oral  Oral  Resp:  18 18  18   Height:      Weight:      SpO2: 98% 98%  99%    Intake/Output Summary (Last 24 hours) at 09/29/14 1759 Last data filed at 09/29/14 1753  Gross per 24 hour  Intake 1548.17 ml  Output   1865 ml  Net -316.83 ml    Exam:   General:  Pt is alert, follows commands appropriately, not in acute distress  Cardiovascular: Regular rate and rhythm, no rubs, no gallops  Respiratory: Clear to auscultation bilaterally, no wheezing, no crackles, no rhonchi  Abdomen: Soft, non tender, bowel sounds present, no guarding  Extremities: No edema, pulses DP and PT palpable bilaterally  Neuro: Grossly nonfocal  Data Reviewed: Basic Metabolic Panel:  Recent Labs Lab 09/23/14 0509 09/24/14 0445 09/26/14 0542 09/27/14 0528  NA 137 138 133* 134*  K 4.2 4.3 4.7 4.6  CL 100 101 98 98  CO2 26 23 25 25   GLUCOSE 130* 145* 147* 149*  BUN 16 19 19 20   CREATININE 0.79 0.73 0.80 0.73  CALCIUM 9.0 8.8 8.6 8.8  MG 2.1  --  2.0  --   PHOS 4.0  --  3.8  --    Liver Function Tests:  Recent Labs Lab 09/23/14 0509 09/26/14  0542  AST 21 17  ALT 20 26  ALKPHOS 114 95  BILITOT 0.3 0.3  PROT 6.4 6.5  ALBUMIN 1.8* 1.9*   CBC:  Recent Labs Lab 09/23/14 0509 09/24/14 0445 09/25/14 0624 09/26/14 0542 09/27/14 0528 09/28/14 0615  WBC 16.9* 18.5* 18.4* 18.9* 18.8* 15.5*  NEUTROABS 12.4*  --   --   --   --   --   HGB 8.6* 8.8* 8.7* 8.9* 8.8* 8.9*  HCT 26.1* 27.4* 27.2* 27.9* 27.6* 27.5*  MCV 93.5 93.2 94.8 93.6 94.8 93.5  PLT 839* 932* 840* 933* 798* 820*   CBG:  Recent Labs Lab 09/26/14 0535 09/26/14 1355 09/26/14 2124 09/27/14 0554 09/27/14 1355  GLUCAP 145* 185* 174* 144* 156*   Scheduled Meds: . acetaminophen  650 mg Oral TID  . becaplermin   Topical Q7 days  . citalopram  20 mg Oral Daily  . feeding supplement (RESOURCE BREEZE)  1 Container Oral BID BM  . ferrous sulfate  325 mg Oral QPC lunch  . heparin subcutaneous  5,000 Units Subcutaneous 3 times per day   . lip balm  1 application Topical BID  . mesalamine  1,000 mg Rectal QHS  . metoprolol tartrate  25 mg Oral BID  . multivitamins with iron  1 tablet Oral QHS  . pantoprazole  40 mg Oral Daily  . piperacillin-tazobactam (ZOSYN)  IV  3.375 g Intravenous 3 times per day  . saccharomyces boulardii  250 mg Oral BID  . sodium chloride  10-40 mL Intracatheter Q12H   Continuous Infusions: . sodium chloride 10 mL/hr (09/28/14 1328)

## 2014-09-29 NOTE — Plan of Care (Signed)
Problem: Phase II Progression Outcomes Goal: Surgical site without signs of infection Outcome: Completed/Met Date Met:  09/29/14

## 2014-09-29 NOTE — Plan of Care (Signed)
Problem: Phase II Progression Outcomes Goal: Vital signs stable Outcome: Completed/Met Date Met:  09/29/14

## 2014-09-29 NOTE — Plan of Care (Signed)
Problem: Phase III Progression Outcomes Goal: Voiding independently Outcome: Completed/Met Date Met:  09/29/14

## 2014-09-29 NOTE — Progress Notes (Signed)
Patient ID: Ronald Mayo, male   DOB: 1941-06-18, 73 y.o.   MRN: [id number] Patient ID: Ronald Mayo, male   DOB: 1941-06-18, 73 y.o.   MRN: [id number] 19 Days Post-Op  Subjective: No complaints this morning. Has some occasional incisional pain that is stable. No further drainage from his rectum since early yesterday morning He is eating well and Ileostomy functioning.  He is getting out of bed to the chair and walked part way down the hall yesterday.  Objective: Vital signs in last 24 hours: Temp:  [98.2 F (36.8 C)-98.7 F (37.1 C)] 98.7 F (37.1 C) (11/22 0600) Pulse Rate:  [63-78] 63 (11/22 0600) Resp:  [18] 18 (11/22 0600) BP: (112-132)/(58-63) 132/58 mmHg (11/22 0600) SpO2:  [98 %] 98 % (11/22 0600) Last BM Date: 09/28/14  Intake/Output from previous day: 11/21 0701 - 11/22 0700 In: 1280.5 [P.O.:840; I.V.:240.5; IV Piggyback:200] Out: 2540 [Urine:1775; Drains:15; Stool:750] Intake/Output this shift: Total I/O In: 240 [P.O.:240] Out: 175 [Urine:175]  General appearance: alert, cooperative and no distress GI: normal findings: soft, non-tender and nondistended Incision/Wound: dressing changed with nurse. Some  areas of necrotic fascia at the base but increased granulation tissue overall without unusual drainage  Lab Results:   Recent Labs  09/27/14 0528 09/28/14 0615  WBC 18.8* 15.5*  HGB 8.8* 8.9*  HCT 27.6* 27.5*  PLT 798* 820*   BMET  Recent Labs  09/27/14 0528  NA 134*  K 4.6  CL 98  CO2 25  GLUCOSE 149*  BUN 20  CREATININE 0.73  CALCIUM 8.8     Studies/Results: No results found.  Anti-infectives: Anti-infectives    Start     Dose/Rate Route Frequency Ordered Stop   09/16/14 1630  piperacillin-tazobactam (ZOSYN) IVPB 3.375 g     3.375 g12.5 mL/hr over 240 Minutes Intravenous 3 times per day 09/16/14 1620     09/10/14 2200  cefoTEtan (CEFOTAN) 2 g in dextrose 5 % 50 mL IVPB     2 g100 mL/hr over 30 Minutes Intravenous Every 12 hours 09/10/14  1644 09/11/14 1130   09/10/14 0600  cefoTEtan (CEFOTAN) 2 g in dextrose 5 % 50 mL IVPB     2 g100 mL/hr over 30 Minutes Intravenous On call to O.R. 09/09/14 1321 09/10/14 1142      Assessment/Plan: 1.Sigmoid stricture - on colonoscopy by Dr. Leone PayorGessner - 09/04/2014 [note that this stricture was documented in 2011 on a colonoscopy attempt. S/p Total abdominal colectomy with end ileostomy, Dr. Harden MoMatt Wakefield, 09/10/14. Concern for stump leak, based on drainage from the JP/CT findings: 4.2 x 4.4 x 4.8 cm focal collection of debris and gas in the right central mesentery 2. Anemia - Hgb - 10/6 - 09/07/2014 transfused 09/14/14 3. Malnutrition - Prealbumin- 12.7 On TNA 4. DM 5. CAD 6. Chronic History of A. Fib rate in the 80's. 7. Very hard of hearing 8. DVT prophylaxis - on no chemoprophylaxis due to UC 9. Rectal bleeding on heparin last 11/10-11/15 (Heparin is off again for rectal bleeding)  Overall seems to be stable/improving. Leukocytosis is improved and less rectal drainage. Possibly drain the fluid collection through his rectal stump. Check CBC tomorrow. Possibly ready for SNF this week    LOS: 27 days    Britnie Colville T 09/29/2014

## 2014-09-30 ENCOUNTER — Inpatient Hospital Stay (HOSPITAL_COMMUNITY): Payer: Medicare PPO

## 2014-09-30 LAB — COMPREHENSIVE METABOLIC PANEL
ALK PHOS: 84 U/L (ref 39–117)
ALT: 17 U/L (ref 0–53)
AST: 12 U/L (ref 0–37)
Albumin: 2.2 g/dL — ABNORMAL LOW (ref 3.5–5.2)
Anion gap: 12 (ref 5–15)
BUN: 16 mg/dL (ref 6–23)
CHLORIDE: 97 meq/L (ref 96–112)
CO2: 25 mEq/L (ref 19–32)
Calcium: 9 mg/dL (ref 8.4–10.5)
Creatinine, Ser: 0.77 mg/dL (ref 0.50–1.35)
GFR, EST NON AFRICAN AMERICAN: 89 mL/min — AB (ref >90)
GLUCOSE: 145 mg/dL — AB (ref 70–99)
POTASSIUM: 4.1 meq/L (ref 3.7–5.3)
SODIUM: 134 meq/L — AB (ref 137–147)
Total Bilirubin: 0.2 mg/dL — ABNORMAL LOW (ref 0.3–1.2)
Total Protein: 6.8 g/dL (ref 6.0–8.3)

## 2014-09-30 LAB — CBC
HCT: 28.7 % — ABNORMAL LOW (ref 39.0–52.0)
HEMOGLOBIN: 9.1 g/dL — AB (ref 13.0–17.0)
MCH: 29.5 pg (ref 26.0–34.0)
MCHC: 31.7 g/dL (ref 30.0–36.0)
MCV: 93.2 fL (ref 78.0–100.0)
PLATELETS: 747 10*3/uL — AB (ref 150–400)
RBC: 3.08 MIL/uL — AB (ref 4.22–5.81)
RDW: 15.1 % (ref 11.5–15.5)
WBC: 11.6 10*3/uL — AB (ref 4.0–10.5)

## 2014-09-30 LAB — PREALBUMIN: Prealbumin: 20 mg/dL (ref 17.0–34.0)

## 2014-09-30 MED ORDER — AMOXICILLIN-POT CLAVULANATE 875-125 MG PO TABS
1.0000 | ORAL_TABLET | Freq: Two times a day (BID) | ORAL | Status: DC
  Administered 2014-09-30 – 2014-10-01 (×2): 1 via ORAL
  Filled 2014-09-30 (×3): qty 1

## 2014-09-30 MED ORDER — ALTEPLASE 2 MG IJ SOLR
2.0000 mg | Freq: Once | INTRAMUSCULAR | Status: AC
  Administered 2014-09-30: 2 mg
  Filled 2014-09-30: qty 2

## 2014-09-30 MED ORDER — IOHEXOL 300 MG/ML  SOLN
25.0000 mL | INTRAMUSCULAR | Status: AC
  Administered 2014-09-30 (×2): 25 mL via INTRAVENOUS

## 2014-09-30 MED ORDER — IOHEXOL 300 MG/ML  SOLN
100.0000 mL | Freq: Once | INTRAMUSCULAR | Status: AC | PRN
  Administered 2014-09-30: 100 mL via INTRAVENOUS

## 2014-09-30 NOTE — Progress Notes (Addendum)
CSW assisting with d/c planning. Possible d/c 11/ 24 according to nsg. [name] - [facility] . [name] availability and [facility]. Today's PT notes sent to SNF. Will also send current progress notes once available. Documentation showing TID dressing changes is needed ( if this is stilll appropriate ). Hopefully insurance will authorize SNF based on wound care needs. Pt requires only " min guard and ambulating 150 ft. " which may not qualify [facility]. CSW will continue following to assist with d/c planning needs.  Cori RazorJamie Tung Pustejovsky LCSW [phone]  1: 20   [facility]. CSW has left a message for pt's daughter, [name], requesting permission to extend SNF search. Awaiting return call.  Cori RazorJamie Haaris Metallo LCSW [phone]

## 2014-09-30 NOTE — Progress Notes (Signed)
NUTRITION FOLLOW UP  Intervention:    Continue to provide Resource Breeze po BID, each supplement provides 250 kcal and 9 grams of protein instead of Prostat BID  Continue to provide Magic cup TID with meals, each supplement provides 290 kcal and 9 grams of protein  Encourage PO intake  Nutrition Dx:   Inadequate oral intake related to nausea/abd pain as evidenced by PO intake < 75% for 2 months, 30 lb weight loss in 3 months; ongoing but improved.  Goal:   Pt to meet >/= 90% of their estimated nutrition needs; being met  Monitor:   PO and supplemental intake, labs, weights, GI profile  Assessment:   11/3 S/p Total abdominal colectomy with end ileostomy Calorie Count completed 11/19 -intake much improved TPN stopped 11/20  PO intake: 100%, per RN pt ate very well this AM but was still full at lunch time so did not have tray at lunch. Pt is still consuming and tolerating magic cups and Resource Breeze supplements with no issues.  Per RN, possible d/c tomorrow.  Labs reviewed: Low Na Glucose 145  Height: Ht Readings from Last 1 Encounters:  09/10/14 6' (1.829 m)    Weight Status:   Wt Readings from Last 1 Encounters:  09/17/14 229 lb 8 oz (104.1 kg)  09/10/14 224 lb 09/02/14 216 lb  Re-estimated needs:  Kcal: 2200-2400  Protein: 115-130 gram  Fluid: >/= 2200 ml daily   Skin:+1 LLE edema; generalized edema, wound on left heel; closed incision on abdomen  Diet Order: Diet regular    Intake/Output Summary (Last 24 hours) at 09/30/14 1527 Last data filed at 09/30/14 1400  Gross per 24 hour  Intake 1361.67 ml  Output   2172 ml  Net -810.33 ml    Last BM: 11/15 via ileostomy  Labs:   Recent Labs Lab 09/26/14 0542 09/27/14 0528 09/30/14 0445  NA 133* 134* 134*  K 4.7 4.6 4.1  CL 98 98 97  CO2 _0 BUN _1 CREATININE 0.80 0.73 0.77  CALCIUM 8.6 8.8 9.0  MG 2.0  --   --   PHOS 3.8  --   --   GLUCOSE 147* 149* 145*    CBG (last 3)   No results for input(s): GLUCAP in the last 72 hours.  Scheduled Meds: . acetaminophen  650 mg Oral TID  . amoxicillin-clavulanate  1 tablet Oral Q12H  . becaplermin   Topical Q7 days  . citalopram  20 mg Oral Daily  . feeding supplement (RESOURCE BREEZE)  1 Container Oral BID BM  . ferrous sulfate  325 mg Oral QPC lunch  . heparin subcutaneous  5,000 Units Subcutaneous 3 times per day  . lip balm  1 application Topical BID  . mesalamine  1,000 mg Rectal QHS  . metoprolol tartrate  25 mg Oral BID  . multivitamins with iron  1 tablet Oral QHS  . pantoprazole  40 mg Oral Daily  . saccharomyces boulardii  250 mg Oral BID  . sodium chloride  10-40 mL Intracatheter Q12H    Continuous Infusions: . sodium chloride 10 mL/hr at 09/29/14 2134    Clayton Bibles, MS, RD, LDN Pager: [phone] After Hours Pager: [phone]

## 2014-09-30 NOTE — Progress Notes (Signed)
PT HAD 3 EVENTS OF MODERATE AMOUNT OF  SEROUS DISCHARGE FROM HIS  RECTUM, TINGED WITH BLOOD. Will continue to monitor.

## 2014-09-30 NOTE — Progress Notes (Signed)
20 Days Post-Op  Subjective:  Tolerating diet and ostomy working. He says he feels full very fast and has to work to eat slowly.  Worried his stomach will come out of his wound. Wound is cleaning up.  He has one very loose sutures midline, I have taken some of the necrotic stuff out.  The base of the incision is cleaning up and looks better.  Objective: Vital signs in last 24 hours: Temp:  [98.1 F (36.7 C)-98.8 F (37.1 C)] 98.1 F (36.7 C) (11/23 0540) Pulse Rate:  [77-95] 77 (11/23 0540) Resp:  [18] 18 (11/23 0540) BP: (115-139)/(53-63) 115/58 mmHg (11/23 0540) SpO2:  [98 %-99 %] 98 % (11/23 0540) Last BM Date: 09/30/14 Good PO intake, 2 ml from the drain Regular diet Afebrile, VSS WBC improving. On  Zosyn since 09/16/14. CT scan shows; (30% decreased involume) fluid and gas collection in the central right mesentery anterior to the uncinate process of the pancreas and right lateral of the superior mesenteric vein compared to 09/23/2014.  Intake/Output from previous day: 11/22 0701 - 11/23 0700 In: 1671.2 [P.O.:1320; I.V.:201.2; IV Piggyback:150] Out: 2077 [Urine:1620; Drains:2; Stool:455] Intake/Output this shift: Total I/O In: 240 [P.O.:240] Out: 325 [Urine:200; Stool:125]  General appearance: alert, cooperative and no distress Resp: BS down in the bases some, but otherwise clear.   GI: he is not distended, +BS, ileostomy is working well.  Open wound is showing slow improvement.  Deep holes of purulent material is cleaning up, not much purulent drainage now.  he has allot of white nonviable tissue, and I have removed some of it sharpely.  Sutures are intact, but one in the mid portion has 2-3 inches of loose suture.  No peritoneal drainage from site.   Lab Results:   Recent Labs  09/28/14 0615 09/30/14 0445  WBC 15.5* 11.6*  HGB 8.9* 9.1*  HCT 27.5* 28.7*  PLT 820* 747*    BMET  Recent Labs  09/30/14 0445  NA 134*  K 4.1  CL 97  CO2 25  GLUCOSE 145*  BUN  16  CREATININE 0.77  CALCIUM 9.0   PT/INR No results for input(s): LABPROT, INR in the last 72 hours.   Recent Labs Lab 09/26/14 0542 09/30/14 0445  AST 17 12  ALT 26 17  ALKPHOS 95 84  BILITOT 0.3 0.2*  PROT 6.5 6.8  ALBUMIN 1.9* 2.2*     Lipase     Component Value Date/Time   LIPASE 22.0 03/03/2007 0941     Studies/Results: No results found.  Medications: . acetaminophen  650 mg Oral TID  . becaplermin   Topical Q7 days  . citalopram  20 mg Oral Daily  . feeding supplement (RESOURCE BREEZE)  1 Container Oral BID BM  . ferrous sulfate  325 mg Oral QPC lunch  . heparin subcutaneous  5,000 Units Subcutaneous 3 times per day  . lip balm  1 application Topical BID  . mesalamine  1,000 mg Rectal QHS  . metoprolol tartrate  25 mg Oral BID  . multivitamins with iron  1 tablet Oral QHS  . pantoprazole  40 mg Oral Daily  . piperacillin-tazobactam (ZOSYN)  IV  3.375 g Intravenous 3 times per day  . saccharomyces boulardii  250 mg Oral BID  . 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 Leone PayorGessner - 09/04/2014  S/p Total abdominal colectomy with end ileostomy, Dr. Harden MoMatt Wakefield, 09/10/14. Concern for stump leak,  based on drainage from the JP/CT findings: 4.2 x 4.4 x4.8 cm focal collection of debris and gas in the right central mesentery  Left sided JP drain.  2. Anemia - Hgb - 10/6 - 09/07/2014 transfused 09/14/14 3. Malnutrition - Prealbumin- 12.7 On TNA 4. DM 5. CAD 6. Chronic History of A. Fib rate in the 80's. 7. Very hard of hearing 8. DVT prophylaxis - on no chemoprophylaxis due to UC 9. Rectal bleeding on heparin last 11/10-11/15   (Heparin is off again for rectal bleeding) 10. Rectal drainage 11.  Fluid collection beside uncinate process   Plan:  I am going to switch him to PO Augmentin for now and see how he does. I will have Dr. Ezzard StandingNewman look at  the wound and see if he wants to try and tighten up the very loose suture in the mid portion.  Discuss timing for SNF.  Recheck WBC tomorrow.   LOS: 28 days    JENNINGS,WILLARD 09/30/2014  Agree with above. I removed the suture from the wound.  It will not help in wound care. His ostomy bag is leaking into the wound. CT abdomen - 09/30/2014 - 1. Persistent but slightly improved (approximately 30% decreased in volume) fluid and gas collection in the central right mesentery anterior to the uncinate process of the pancreas and right lateral of the superior mesenteric vein compared to 09/23/2014. 2. Perhaps trace (too small for reliable measurement) locules of extraluminal air and fluid versus thickening along the undersurface of the mesentery of several loops of small bowel in the left lower quadrant. This finding is of uncertain clinical significance. 3. 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 Percutaneous JP drain remains present within the pelvis adjacent to the Hartmann's pouch. 4. Progressive atelectasis versus early infiltrate in the bilateral lower lobes.  He is planning on going home to stay with his daughter.  Ovidio Kinavid Toia Micale, MD, [facility] 2FACS [facility] Pager: [phone] Office phone:  [phone]

## 2014-09-30 NOTE — Progress Notes (Signed)
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 Leone Payor - 09/04/2014  - s/p total abdominal colectomy with end ileostomy by Dr. Dwain Sarna 11/03 - concern of post op stump leak, based on drainage from the JP/CT findings: 4.2 x 4.4 x 4.8 cm focal collection of debris and gas in the right central mesentery -wound suture removed by Dr Ezzard Standing today. - received  Zosyn for 14 days for mesenteric abscess.switched to oral Augmentin. Leukocytosis improved. Afebrile. - repeat CT abd 11/16 with new small abscess, no surgical intervention required  - appreciate surgery follow up. May be able to d/c home tomorrow with [facility] ( refused SNF) - continue Mesalamine supp per GI - off TPN since 11/20, tolerating current diet but reports abd pain after eating ( thinks he is trying to eat too fast). instructed to slow down.  Active Problems: Acute encephalopathy  - had worsening mental status 09/23/2014 . now resolved. - PCA Dilaudid stopped 11/15 as it could have contributed to confusion; pt is currently on dilaudid 1 mg every 4 hours IV PRN severe pain   Acute on chronic blood loss anemia, UC with post op bleed - Hemoglobin stable.  no reports of bleeding  -resumed DVT prophylaxis  Atrial fib - rate controlled with metoprolol 25 mg PO BID - not a candidate for anticoagulation due to multiple acute issue per cardiology; appreciate cardiology recommendations   DM type II - pt at home on Amaryl, on hold  - continue  SSI   Protein-calorie malnutrition, severe  - secondary to progressive nature of UC and acute illness  - NGT out 11/14 - off TPN since11/20  Chronic venous insufficiency ulcerations, left medial malleolus, left heel. - medial malleolar ulceration  healed. Left heel ulcer measures 0.5cm x 0.8cm, x 0.4cm - Wound WJX:BJYNW, pink, moist, mild drainage serous  - recommend regranex applied following cleanse with NS, and pat dry. This is topped with a soft silicone foam dressing - appreciate wound care team assessment   Ostomy  - RUQ ileostomy, 1 and 5/8 inches round, red, slightly budded - appreciate wound care team recommendation  Acute functional quadriplegia Recommend SNF, but wants to go home with his daughter . Will arrange [facility] and PT.   DVT prophylaxis  Heparin SQ,   Code Status: Full Family Communication: No family at bedside  Disposition Plan: home with [facility]. ? Possibly tomorrow  HPI/Subjective: Seen and examined . Reports abdominal pain shortly after eating. Says he has ben trying eat too fast.able to ambulate in the hallway with minimal assist.  Objective: Filed Vitals:   09/30/14 0540  BP: 115/58  Pulse: 77  Temp: 98.1 F (36.7 C)  Resp: 18    Intake/Output Summary (Last 24 hours) at 09/30/14 1437 Last data filed at 09/30/14 1400  Gross per 24 hour  Intake 1361.67 ml  Output   1972 ml  Net -610.33 ml   Filed Weights   09/10/14 1615 09/14/14 0400 09/17/14 0400  Weight: 101.8 kg (224 lb 6.9 oz) 100.6 kg (221 lb 12.5 oz) 104.1 kg (229 lb 8 oz)    Exam:   General:  Elderly male in NAD  HEENT: pallor+, Moist mucosa  CHEST: Clear b/l, no added sounds   CVS: NS1&S2   ABD: mild distention, colostomy in place, left JP drain  Ext: warm, no edema  CNS: alert and oreinted    Data Reviewed: Basic Metabolic Panel:  Recent Labs Lab 09/24/14 0445 09/26/14 0542 09/27/14 0528 09/30/14 0445  NA 138 133* 134* 134*  K 4.3 4.7 4.6 4.1  CL 101 98 98 97  CO2 23 25 25 25   GLUCOSE 145* 147* 149* 145*  BUN 19 19 20 16   CREATININE 0.73 0.80 0.73 0.77  CALCIUM 8.8 8.6 8.8 9.0  MG  --  2.0  --   --   PHOS  --  3.8  --   --    Liver Function Tests:  Recent Labs Lab 09/26/14 0542 09/30/14 0445   AST 17 12  ALT 26 17  ALKPHOS 95 84  BILITOT 0.3 0.2*  PROT 6.5 6.8  ALBUMIN 1.9* 2.2*   No results for input(s): LIPASE, AMYLASE in the last 168 hours. No results for input(s): AMMONIA in the last 168 hours. CBC:  Recent Labs Lab 09/25/14 0624 09/26/14 0542 09/27/14 0528 09/28/14 0615 09/30/14 0445  WBC 18.4* 18.9* 18.8* 15.5* 11.6*  HGB 8.7* 8.9* 8.8* 8.9* 9.1*  HCT 27.2* 27.9* 27.6* 27.5* 28.7*  MCV 94.8 93.6 94.8 93.5 93.2  PLT 840* 933* 798* 820* 747*   Cardiac Enzymes: No results for input(s): CKTOTAL, CKMB, CKMBINDEX, TROPONINI in the last 168 hours. BNP (last 3 results)  Recent Labs  09/12/14 0520  PROBNP 116.2   CBG:  Recent Labs Lab 09/26/14 0535 09/26/14 1355 09/26/14 2124 09/27/14 0554 09/27/14 1355  GLUCAP 145* 185* 174* 144* 156*    No results found for this or any previous visit (from the past 240 hour(s)).   Studies: Ct Abdomen Pelvis W Contrast  09/30/2014   CLINICAL DATA:  73 year old male with a history of ulcerative colitis complicated by sigmoid stricture and large bowel obstruction. Patient underwent total abdominal colectomy with end ileostomy on 09/10/2014. Recent CT imaging from 09/23/2014 demonstrates a fluid and gas collection in the right abdominal mesentery. Evaluate for stump leak, or progression of intra-abdominal abscess.  EXAM: CT ABDOMEN AND PELVIS WITH CONTRAST  TECHNIQUE: Multidetector CT imaging of the abdomen and pelvis was performed using the standard 78.2[facility] HospitJac32JanProv78.2[place] [facility]78.2[facility] Cent[facility]78.2[facility] - Santa RoJac578.2[facility] S78.2[facility] 78.2[facility]78.2[place]78.2[facility]78.2[facility]78.2[facility] 78.2[facility]78.2[facility] H78.2D78.2[facility]78.2[facility]78.2[facility] 78.2[facility] Medical CentJac85JanPresbyterian Me78.2Northwest Medical CentJac(22JanLarned StaKentuckyteDeYavapai RegioEligOp>956aTheadora Ramaterr Dan C Trigg MemoriKentuckyalDeBoston Eye SurgerEligOp>95<MEASUREMEN 5  FINDINGS: Lower Chest: Progressive atelectasis of bordering on mild consolidation in the bilateral lower lobes compared to prior. Atelectasis is favored. Stable visualized cardiac and mediastinal structures. No pericardial effusion. Unremarkable visualized distal thoracic esophagus.  Abdomen:  Unremarkable CT appearance of the stomach, duodenum, spleen, adrenal glands and pancreas. Normal hepatic contour and morphology. Gallbladder is unremarkable. No intra or extrahepatic biliary ductal dilatation.  No evidence of hydronephrosis or nephrolithiasis. The numerous low-attenuation cystic lesions bilaterally. The larger lesions are water attenuation and consistent with a renal cyst. Multiple sub cm lesions bilaterally are too small for accurate characterization but also statistically likely to represent benign cysts.  Slightly decreased size of the fluid and gas collection in the right central mesentery just anterior of the uncinate process of the pancreas and right lateral of the superior mesenteric vein. Today, this collection measures 3.6 x 3.9 x 4.1 cm compared to 4.2 x 4.4 x 4.4 cm. This represents an approximately 30% decrease in overall volume. No evidence of ileus or obstruction. Surgical changes of total colectomy with residual Hartmann's pouch in the pelvis. An end ileostomy is present in the right lower quadrant. Mild nonspecific stranding in  the mesentery of the left mid abdomen at the level of the left kidney is similar compared to prior. No new abscess or focal fluid collection identified. A percutaneous JP drain is noted adjacent to the Hartmann's pouch in the anatomic pelvis. There may be a few tiny locules of extraluminal air and trace interloop fluid versus inflammatory stranding within the mesentery of several loops of small bowel in the left lower quadrant (axial image 75 series 2). This collection is too small for reliable measurement and of dubious clinical significance.  Bones/Soft Tissues: No acute fracture or aggressive appearing lytic or blastic osseous lesion. Multilevel degenerative disc disease and lower lumbar facet arthropathy without significant interval progression.  Vascular: Atherosclerotic vascular disease without significant stenosis or aneurysmal dilatation.  IMPRESSION:  1. Persistent but slightly improved (approximately 30% decreased in volume) fluid and gas collection in the central right mesentery anterior to the uncinate process of the pancreas and right lateral of the superior mesenteric vein compared to 09/23/2014. 2. Perhaps trace (too small for reliable measurement) locules of extraluminal air and fluid versus thickening along the undersurface of the mesentery of several loops of small bowel in the left lower quadrant. This finding is of uncertain clinical significance. 3. 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 Percutaneous JP drain remains present within the pelvis adjacent to the Hartmann's pouch. 4. Progressive atelectasis versus early infiltrate in the bilateral lower lobes. 5. Additional ancillary findings as above without significant interval change.   Electronically Signed   By: Malachy MoanHeath  McCullough M.D.   On: 09/30/2014 13:05    Scheduled Meds: . acetaminophen  650 mg Oral TID  . amoxicillin-clavulanate  1 tablet Oral Q12H  . becaplermin   Topical Q7 days  . citalopram  20 mg Oral Daily  . feeding supplement (RESOURCE BREEZE)  1 Container Oral BID BM  . ferrous sulfate  325 mg Oral QPC lunch  . heparin subcutaneous  5,000 Units Subcutaneous 3 times per day  . lip balm  1 application Topical BID  . mesalamine  1,000 mg Rectal QHS  . metoprolol tartrate  25 mg Oral BID  . multivitamins with iron  1 tablet Oral QHS  . pantoprazole  40 mg Oral Daily  . saccharomyces boulardii  250 mg Oral BID  . sodium chloride  10-40 mL Intracatheter Q12H   Continuous Infusions: . sodium chloride 10 mL/hr at 09/29/14 2134      Time spent: 25 minutes    Ronald Mayo  [facility] Pager [phone]. If 7PM-7AM, please contact night-coverage at [email or web address], password [email or web address] 09/30/2014, 2:37 PM  LOS: 28 days

## 2014-09-30 NOTE — Progress Notes (Signed)
CSW assisting with d/c planning. Family is unhappy with SNF choices. [facility]. Daughter, [name] , will have pt d/c to her home when stable. She is aware that d/c could be as early as tomorrow. [name] is a Engineer, civil (consulting)nurse and is able to provide assistance with wound care. RNCM has been alerted and will assist with home care service arrangements. CSW signing off at this time.   Cori RazorJamie Trianna Lupien LCSW [phone]

## 2014-09-30 NOTE — Progress Notes (Signed)
Physical Therapy Treatment Patient Details Name: Ronald Mayo MRN: [id number] DOB: 1941-02-03 Today's Date: 09/30/2014    History of Present Illness 73 y.o. male with h/o ulcerative colitis, DM, CAD, a fib, chronic diarrhea, chronic LLE wound admitted with bloody stools, abdominal pain and significant weight loss. Dx of colon stricture. 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 PT Re-eval-09/11/14    PT Comments    Pt able to tolerate ambulating in hallway today however distance limited due to fatigue and nausea (from CT contrast per pt).  Follow Up Recommendations  SNF     Equipment Recommendations  None recommended by PT    Recommendations for Other Services       Precautions / Restrictions Precautions Precautions: Fall Precaution Comments: chronic wound L heel, pt wears compression stocking, L abdominal drain, HOH Restrictions Weight Bearing Restrictions: No    Mobility  Bed Mobility Overal bed mobility: Modified Independent                Transfers Overall transfer level: Needs assistance Equipment used: Rolling walker (2 wheeled) Transfers: Sit to/from Stand Sit to Stand: Supervision            Ambulation/Gait Ambulation/Gait assistance: Min guard Ambulation Distance (Feet): 120 Feet Assistive device: Rolling walker (2 wheeled) Gait Pattern/deviations: Step-through pattern;Decreased stride length;Decreased stance time - left Gait velocity: decr   General Gait Details: pt able to perform step through pattern today however decreased stride length, pt distance limited by fatigue and nausea (per pt due to CT contrast)   Stairs            Wheelchair Mobility    Modified Rankin (Stroke Patients Only)       Balance                                    Cognition Arousal/Alertness: Awake/alert Behavior During Therapy: WFL for tasks assessed/performed Overall Cognitive Status: Within Functional Limits for tasks  assessed                      Exercises      General Comments        Pertinent Vitals/Pain Pain Assessment: 0-10 Pain Score: 5  Pain Location: abdomen Pain Descriptors / Indicators: Sore Pain Intervention(s): Monitored during session;Premedicated before session    Home Living                      Prior Function            PT Goals (current goals can now be found in the care plan section) Progress towards PT goals: Progressing toward goals    Frequency  Min 3X/week    PT Plan Current plan remains appropriate    Co-evaluation             End of Session   Activity Tolerance: Patient limited by fatigue Patient left: in bed;with call bell/phone within reach     Time: [id number] PT Time Calculation (min) (ACUTE ONLY): 15 min  Charges:  $Gait Training: 8-22 mins                    G Codes:      Arnett Duddy,KATHrine E 09/30/2014, 10:52 AM Zenovia JarredKati Shyanna Klingel, PT, DPT 09/30/2014 Pager: [phone]

## 2014-10-01 DIAGNOSIS — E119 Type 2 diabetes mellitus without complications: Secondary | ICD-10-CM

## 2014-10-01 DIAGNOSIS — Z9049 Acquired absence of other specified parts of digestive tract: Secondary | ICD-10-CM

## 2014-10-01 LAB — CBC
HEMATOCRIT: 28.5 % — AB (ref 39.0–52.0)
Hemoglobin: 9.2 g/dL — ABNORMAL LOW (ref 13.0–17.0)
MCH: 30.3 pg (ref 26.0–34.0)
MCHC: 32.3 g/dL (ref 30.0–36.0)
MCV: 93.8 fL (ref 78.0–100.0)
PLATELETS: 695 10*3/uL — AB (ref 150–400)
RBC: 3.04 MIL/uL — ABNORMAL LOW (ref 4.22–5.81)
RDW: 15.4 % (ref 11.5–15.5)
WBC: 11.8 10*3/uL — ABNORMAL HIGH (ref 4.0–10.5)

## 2014-10-01 MED ORDER — AMOXICILLIN-POT CLAVULANATE 875-125 MG PO TABS: 1.0000 | ORAL_TABLET | Freq: Two times a day (BID) | ORAL | Status: AC

## 2014-10-01 MED ORDER — METOPROLOL TARTRATE 25 MG PO TABS: 25.0000 mg | ORAL_TABLET | Freq: Two times a day (BID) | ORAL | Status: AC

## 2014-10-01 MED ORDER — ONDANSETRON HCL 4 MG PO TABS: 4.0000 mg | ORAL_TABLET | Freq: Three times a day (TID) | ORAL | Status: DC | PRN

## 2014-10-01 MED ORDER — FERROUS SULFATE 325 (65 FE) MG PO TABS: 325.0000 mg | ORAL_TABLET | Freq: Every day | ORAL | Status: AC

## 2014-10-01 MED ORDER — ALUM & MAG HYDROXIDE-SIMETH 200-200-20 MG/5ML PO SUSP: 30.0000 mL | Freq: Four times a day (QID) | ORAL | Status: DC | PRN

## 2014-10-01 MED ORDER — SACCHAROMYCES BOULARDII 250 MG PO CAPS: 250.0000 mg | ORAL_CAPSULE | Freq: Two times a day (BID) | ORAL | Status: DC

## 2014-10-01 MED ORDER — TAB-A-VITE/IRON PO TABS: 1.0000 | ORAL_TABLET | Freq: Every day | ORAL | Status: AC

## 2014-10-01 MED ORDER — OXYCODONE HCL 10 MG PO TABS: 10.0000 mg | ORAL_TABLET | ORAL | Status: DC | PRN

## 2014-10-01 MED ORDER — BOOST / RESOURCE BREEZE PO LIQD: 1.0000 | Freq: Two times a day (BID) | ORAL | Status: DC

## 2014-10-01 NOTE — Discharge Summary (Addendum)
Physician Discharge Summary  Ronald Mayo ZOX:[id number] DOB: Nov 20, 1940 DOA: 09/02/2014  PCP: Aniceto Boss, MD  Admit date: 09/02/2014 Discharge date: 10/01/2014  Time spent: 35  minutes  Recommendations for Outpatient Follow-up:  1. Discharge home with [facility] and PT 2. Follow up with PCP on 10/17/2014 at 1pm. Completes abx on 10/11/2014 3.follow up with [facility] as outpt.     Discharge Diagnoses:  Principal Problem:   Ulcerative colitis with intestinal obstruction, s/p total abdominal colectomy with end ileostomy  Active Problems:   Stricture of sigmoid colon   Abscess of abdominal cavity   Coronary atherosclerosis   Chronic atrial fibrillation   GERD (gastroesophageal reflux disease)   Weakness   Protein-calorie malnutrition, severe   Diabetes mellitus type 2, controlled   Discharge Condition: fair  Diet recommendation: regular with supplements  Filed Weights   09/10/14 1615 09/14/14 0400 09/17/14 0400  Weight: 101.8 kg (224 lb 6.9 oz) 100.6 kg (221 lb 12.5 oz) 104.1 kg (229 lb 8 oz)    History of present illness:  73 year old male with ulcerative colitis on sulfasalazine, DM type II (on Amaryl), presented on 09/02/2014  with main concern of several weeks duration or persistent diarrhea and more recently blood in stool. This has been associated with poor oral intake, malaise, weight loss, subjective fevers, chills, intermittent abd cramping, non radiation, with no specific alleviating or aggravating factors. Ct abd and pelvis c/w active UC. 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 Dwain Sarna 11/03. Hospital course prolonged due to  concern for post op stump leak, based on drainage from the JP/CT findings: 4.2 x 4.4 x 4.8 cm focal collection of debris and gas in the right central mesentery. Hospital course further complicated by overall slow recovery, intermittent episodes of confusion and persistent leukocytosis.    Hospital Course:  Principal  Problem: Ulcerative colitis with sigmoid stricture / mesenteric abscess / leukocytosis  - UC with sigmoid stricture - on colonoscopy by Dr. Leone Payor - 09/04/2014  - s/p total abdominal colectomy with end ileostomy by Dr. Dwain Sarna 11/03 - concern of post op stump leak, based on drainage from the JP/CT findings: 4.2 x 4.4 x 4.8 cm focal collection of debris and gas in the right central mesentery -wound suture removed by Dr Ezzard Standing today. - received Zosyn for 14 days for mesenteric abscess.switched to oral Augmentin. Leukocytosis improved. Afebrile. - repeat CT abd 11/16 with new small abscess, no surgical intervention required  - appreciate surgery follow up.  - resume home mesalamine - off TPN since 11/20, tolerating regular diet now. abd pain better after instructed to eat slowly. -surgery will arrange for outpt follow up.  Active Problems: Acute encephalopathy  - had worsening mental status 09/23/2014 . now resolved. - PCA Dilaudid stopped 11/15 as it could have contributed to confusion;. Discharge on oral oxycodone prn for pain.   Acute on chronic blood loss anemia, UC with post op bleed - Hemoglobin stable. no reports of bleeding  -added iron supplements  Atrial fib - rate controlled with metoprolol 25 mg PO BID - not a candidate for anticoagulation due to multiple acute issue per cardiology; appreciate cardiology recommendations   DM type II Resume home dose amaryl. fsg controlled.   Protein-calorie malnutrition, severe  - secondary to progressive nature of UC and acute illness  - NGT out 11/14 - off TPN since11/20 -tolerating regular diet. Added supplements  Chronic venous insufficiency ulcerations, left medial malleolus, left heel. - medial malleolar ulceration healed. Left heel ulcer  measures 0.5cm x 0.8cm, x 0.4cm - Wound NWG:NFAOZ, pink, moist, mild drainage serous  - recommend regranex applied following cleanse with NS, and pat dry. This is topped with a  soft silicone foam dressing - appreciate wound care team assessment   Ostomy  - RUQ ileostomy, 1 and 5/8 inches round, red, slightly budded - appreciate wound care team recommendation. [facility] for ostomy care.  Acute functional quadriplegia Recommended SNF, but wants to go home with his daughter who is a Engineer, civil (consulting) and says she can take care of him. Pt able to ambulate in the hallway with a walker with minimal assist. Will arrange [facility] and PT.   Procedures and diagnostic studies:    Colonoscopy with biopsy 10/28  Total abdominal colectomy with end ileostomy on 09/10/2014    Ct Abdomen Pelvis W Contrast 09/16/2014 4.2 x 4.4 x 4.8 cm focal collection of debris and gas in the right central mesentery, adjacent to the superior mesenteric vein. This particular location has a different appearance than any of the other fluid seen in the abdomen or pelvis and given the history of leukocytosis, raises concern for evolving abscess, although it does not have a well-defined or enhancing rim at this time. Status post subtotal colectomy with right abdominal end ileostomy. No evidence for bowel obstruction. There is a small amount of ascites adjacent to the liver and in the right paracolic gutter and subtle peritoneal enhancement is seen along some of the fluid collections although they do not appear to be well organized at this time. A very small amount of intraperitoneal free air is associated with free air in the rectus sheath and anterior abdominal wall, not unexpected 6 days out from surgery.    Ct Abdomen Pelvis W Contrast 09/04/2014 Left-sided colitis, likely representing active ulcerative colitis. Infectious could look similar. Ischemia felt unlikely, given distribution and clinical history.Atherosclerosis, coronary arteries     Dg Chest Port 1 View 09/12/2014 PICC line tip in the right atrium.   Dg Chest Port 1 View 09/06/2014 Right PICC line placed, tip at the SVC level. No acute  cardiopulmonary abnormality.    Medical Consultants:    Surgery  Cardiology -- signed off  GI -- signed off       Code Status: Full Family Communication: d/w daughter Tresa Endo on phone Disposition Plan: home with [facility].  Discharge Exam: Filed Vitals:   10/01/14 0954  BP: 121/67  Pulse: 86  Temp: 98 F (36.7 C)  Resp:      General: Elderly male in NAD  HEENT: pallor+, Moist mucosa  CHEST: Clear b/l, no added sounds  CVS: NS1&S2, no murmurs  ABD: non distended, non tender, colostomy in place, left JP drain removed on 11/24  Ext: warm, no edema  CNS: alert and oreinted  Discharge Instructions You were cared for by a hospitalist during your hospital stay. If you have any questions about your discharge medications or the care you received while you were in the hospital after you are discharged, you can call the unit and asked to speak with the hospitalist on call if the hospitalist that took care of you is not available. Once you are discharged, your primary care physician will handle any further medical issues. Please note that NO REFILLS for any discharge medications will be authorized once you are discharged, as it is imperative that you return to your primary care physician (or establish a relationship with a primary care physician if you do not have one) for your aftercare needs so  that they can reassess your need for medications and monitor your lab values.   Current Discharge Medication List    START taking these medications   Details  alum & mag hydroxide-simeth (MAALOX/MYLANTA) 200-200-20 MG/5ML suspension Take 30 mLs by mouth every 6 (six) hours as needed for indigestion or heartburn (or bloating). Qty: 355 mL, Refills: 0    amoxicillin-clavulanate (AUGMENTIN) 875-125 MG per tablet Take 1 tablet by mouth every 12 (twelve) hours. Qty: 26 tablet, Refills: 0    feeding supplement, RESOURCE BREEZE, (RESOURCE BREEZE) LIQD Take 1 Container by mouth 2 (two)  times daily between meals. Qty: 60 Container, Refills: 0    ferrous sulfate 325 (65 FE) MG tablet Take 1 tablet (325 mg total) by mouth daily after lunch. Qty: 30 tablet, Refills: 3    metoprolol tartrate (LOPRESSOR) 25 MG tablet Take 1 tablet (25 mg total) by mouth 2 (two) times daily. Qty: 60 tablet, Refills: 0    Multiple Vitamins-Iron (MULTIVITAMINS WITH IRON) TABS tablet Take 1 tablet by mouth at bedtime. Qty: 30 tablet, Refills: 0    ondansetron (ZOFRAN) 4 MG tablet Take 1 tablet (4 mg total) by mouth every 8 (eight) hours as needed for nausea or vomiting. Qty: 30 tablet, Refills: 0    oxyCODONE 10 MG TABS Take 1 tablet (10 mg total) by mouth every 4 (four) hours as needed for moderate pain. Qty: 40 tablet, Refills: 0    saccharomyces boulardii (FLORASTOR) 250 MG capsule Take 1 capsule (250 mg total) by mouth 2 (two) times daily. Qty: 60 capsule, Refills: 0      CONTINUE these medications which have NOT CHANGED   Details  citalopram (CELEXA) 20 MG tablet Take 20 mg by mouth daily.     fenofibrate (TRICOR) 145 MG tablet Take 145 mg by mouth daily.     glimepiride (AMARYL) 4 MG tablet Take 4 mg by mouth daily before breakfast.     Multiple Vitamin (MULTI VITAMIN DAILY PO) Take 1 tablet by mouth daily.     pantoprazole (PROTONIX) 40 MG tablet Take 1 tablet (40 mg total) by mouth daily. Qty: 30 tablet, Refills: 11   Associated Diagnoses: Gastroesophageal reflux disease, esophagitis presence not specified    sulfaSALAzine (AZULFIDINE) 500 MG tablet Take 2 tablets (1,000 mg total) by mouth 3 (three) times daily. Qty: 180 tablet, Refills: 5   Associated Diagnoses: Ulcerative colitis, with rectal bleeding      STOP taking these medications     HYDROcodone-acetaminophen (NORCO) 10-325 MG per tablet        No Known Allergies Follow-up Information    Follow up with [employer], MD In 2 weeks.   Specialty:  General Surgery   Why:  We are working on an appointment  for you to see Dr. Dwain Sarna in 2 weeks with a CT scan before you come back to see him.   Contact information:   [address] [place] [address] [phone]       Follow up with [facility], MD In 1 week.   Specialty:  Family Medicine   On 10/17/2014 at 1 pm   Contact information:   [address] [place] [address] [phone]        The results of significant diagnostics from this hospitalization (including imaging, microbiology, ancillary and laboratory) are listed below for reference.    Significant Diagnostic Studies: Dg Chest 1 View  09/12/2014   CLINICAL DATA:  PICC placement.  EXAM: CHEST - 1 VIEW  9:37 p.m.  COMPARISON:  09/12/2014 at 6:57 p.m.  FINDINGS: PICC tip is now  17 mm below the carina in good position.  Heart size and pulmonary vascularity are normal. Minimal atelectasis at the left lung base. Lungs are otherwise clear.  No acute osseous abnormality. Old deformity of the proximal right humerus.  IMPRESSION: PICC in good position.  Minimal atelectasis at the left lung base.   Electronically Signed   By: Geanie Cooley M.D.   On: 09/12/2014 22:01   Ct Abdomen Pelvis W Contrast  09/30/2014   CLINICAL DATA:  73 year old male with a history of ulcerative colitis complicated by sigmoid stricture and large bowel obstruction. Patient underwent total abdominal colectomy with end ileostomy on 02/12/2014. Recent CT imaging from 09/23/2014 demonstrates a fluid and gas collection in the right abdominal mesentery. Evaluate for stump leak, or progression of intra-abdominal abscess.  EXAM: CT ABDOMEN AND PELVIS WITH CONTRAST  TECHNIQUE: Multidetector CT imaging of the abdomen and pelvis was performed using the standard protocol following bolus administration of intravenous contrast.  CONTRAST:  OMNIPAQUE IOHEXOL 300 MG/ML  SOLN  COMPARISON:  Most recent prior CT abdomen/ pelvis 09/23/2014  FINDINGS: Lower Chest: Progressive atelectasis of bordering on mild  consolidation in the bilateral lower lobes compared to prior. Atelectasis is favored. Stable visualized cardiac and mediastinal structures. No pericardial effusion. Unremarkable visualized distal thoracic esophagus.  Abdomen: Unremarkable CT appearance of the stomach, duodenum, spleen, adrenal glands and pancreas. Normal hepatic contour and morphology. Gallbladder is unremarkable. No intra or extrahepatic biliary ductal dilatation.  No evidence of hydronephrosis or nephrolithiasis. The numerous low-attenuation cystic lesions bilaterally. The larger lesions are water attenuation and consistent with a renal cyst. Multiple sub cm lesions bilaterally are too small for accurate characterization but also statistically likely to represent benign cysts.  Slightly decreased size of the fluid and gas collection in the right central mesentery just anterior of the uncinate process of the pancreas and right lateral of the superior mesenteric vein. Today, this collection measures 3.6 x 3.9 x 4.1 cm compared to 4.2 x 4.4 x 4.4 cm. This represents an approximately 30% decrease in overall volume. No evidence of ileus or obstruction. Surgical changes of total colectomy with residual Hartmann's pouch in the pelvis. An end ileostomy is present in the right lower quadrant. Mild nonspecific stranding in the mesentery of the left mid abdomen at the level of the left kidney is similar compared to prior. No new abscess or focal fluid collection identified. A percutaneous JP drain is noted adjacent to the Hartmann's pouch in the anatomic pelvis. There may be a few tiny locules of extraluminal air and trace interloop fluid versus inflammatory stranding within the mesentery of several loops of small bowel in the left lower quadrant (axial image 75 series 2). This collection is too small for reliable measurement and of dubious clinical significance.  Bones/Soft Tissues: No acute fracture or aggressive appearing lytic or blastic osseous lesion.  Multilevel degenerative disc disease and lower lumbar facet arthropathy without significant interval progression.  Vascular: Atherosclerotic vascular disease without significant stenosis or aneurysmal dilatation.  IMPRESSION: 1. Persistent but slightly improved (approximately 30% decreased in volume) fluid and gas collection in the central right mesentery anterior to the uncinate process of the pancreas and right lateral of the superior mesenteric vein compared to 09/23/2014. 2. Perhaps trace (too small for reliable measurement) locules of extraluminal air and fluid versus thickening along the undersurface of the mesentery of several loops of small bowel in  the left lower quadrant. This finding is of uncertain clinical significance. 3. 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 Percutaneous JP drain remains present within the pelvis adjacent to the Hartmann's pouch. 4. Progressive atelectasis versus early infiltrate in the bilateral lower lobes. 5. Additional ancillary findings as above without significant interval change.   Electronically Signed   By: Malachy Moan M.D.   On: 09/30/2014 13:05   Ct Abdomen Pelvis W Contrast  09/23/2014   CLINICAL DATA:  Subsequent encounter for status post total colectomy and ileostomy on 2020-01-2414, with increased white blood count and anemia  EXAM: CT ABDOMEN AND PELVIS WITH CONTRAST  TECHNIQUE: Multidetector CT imaging of the abdomen and pelvis was performed using the standard protocol following bolus administration of intravenous contrast.  CONTRAST:  50mL OMNIPAQUE IOHEXOL 300 MG/ML SOLN, OMNIPAQUE IOHEXOL 300 MG/ML SOLN  COMPARISON:  09/16/2014  FINDINGS: No acute musculoskeletal findings. Visualized portions of the lung bases clear.  Liver is normal. Gallbladder mildly distended but otherwise normal. Spleen and pancreas normal.  Adrenal glands normal. Numerous renal cysts stable. Calcification of the aorta  stable.  Status post total colectomy. Right ileostomy. Extensive postsurgical wound in the midline. Deep to the caudal edge of the wound, there is a 22 x 22 mm rounded collection of gas and fluid consistent with a small abscess in the deep subcutaneous soft tissues/ anterior abdominal wall. There is a collection of fluid and gas with mild surrounding inflammatory change measuring 42 x 44 mm in the right abdomen mesentery, not significantly different from prior study.  Single small focus of gas in the nondependent portion of the bladder may be due to recent instrumentation. No other evidence to suggest that it is related to cystitis. Distal colon remnant noted. Percutaneous drainage catheter through the pelvis stable. Persistent but decreased overall peritoneal inflammatory change. Decreased pneumoperitoneum.  IMPRESSION: 1. Stable abscess in the right abdominal mesentery 2. New small abscess measuring about 2.2 cm located at the junction of the deep subcutaneous soft tissues and underlying fascia along the caudal aspect of postsurgical wound anterior abdominal wall. 3. There is persistent but improved peritoneal inflammatory change.   Electronically Signed   By: Esperanza Heir M.D.   On: 09/23/2014 16:13   Ct Abdomen Pelvis W Contrast  09/16/2014   ADDENDUM REPORT: 09/16/2014 16:23  ADDENDUM: I discussed the results of this study with Dr. Maisie Fus at approximately 1620 hours on 09/16/2014.   Electronically Signed   By: Kennith Center M.D.   On: 09/16/2014 16:23   09/16/2014   CLINICAL DATA:  Subsequent encounter for longstanding ulcerative colitis with recent diagnosis is sigmoid stricture. Patient is status post total colectomy with end ileostomy on 2020-01-2414. Rising white cell count.  EXAM: CT ABDOMEN AND PELVIS WITH CONTRAST  TECHNIQUE: Multidetector CT imaging of the abdomen and pelvis was performed using the standard protocol following bolus administration of intravenous contrast.  CONTRAST:  OMNIPAQUE  IOHEXOL 300 MG/ML  SOLN  COMPARISON:  09/04/2014  FINDINGS: Lower chest: Compressive atelectasis is noted in both lower lobes with small bilateral pleural effusions.  Hepatobiliary: No focal abnormality within the liver parenchyma. Gallbladder is distended without evidence for stones. No intrahepatic or extrahepatic biliary dilation.  Pancreas: No focal mass lesion. No dilatation of the main duct. No intraparenchymal cyst. No peripancreatic edema.  Spleen: No splenomegaly. No focal mass lesion.  Adrenals/Urinary Tract: No adrenal nodule or mass. Bilateral renal cysts are again noted. No  hydronephrosis or evidence of hydroureter. Foley catheter decompresses the urinary bladder. Gas in the bladder is compatible with the instrumentation.  Stomach/Bowel: Stomach is moderately distended. Duodenum is normal in appearance. No evidence for small bowel dilatation. No small bowel wall thickening. Gas is visible in the ileum and there is air in the ileal loop tracking out the stoma to the right abdominal and ileostomy. Patient is status post subtotal colectomy. Fluid filled Hartmann's pouch is evident.  Vascular/Lymphatic: Atherosclerotic calcification is noted in the wall of the abdominal aorta without aneurysm. No gastrohepatic or hepatoduodenal ligament lymphadenopathy. No retroperitoneal lymphadenopathy. No evidence for pelvic sidewall lymphadenopathy.  Reproductive: Prostate gland and seminal vesicles are unremarkable.  Other: 4.2 x 4.4 x 4.8 cm focal area of fluid and debris and gas is identified in the right mesentery, just lateral to the superior mesenteric vein. While the remaining areas of intraperitoneal free fluid in the abdomen appear to be more free flowing, this collection appears loculated although it does not have a well-defined or thick rim.  A left abdominal surgical drain tracks down into the pelvis were crosses the midline just caudal to the Hartmann's pouch with the and of the drain coursing along the  right pelvic sidewall. There is small volume perihepatic ascites with a small amount of fluid in the right paracolic gutter. Diffuse areas of mesenteric edema are noted, compatible with recent surgery. There is a small amount of free fluid in the mesenteric loops of the pelvis and in some areas this fluid demonstrates subtle peritoneal enhancement, suggesting associated peritoneal irritation.  The  A small amount of intraperitoneal free air is associated with air in the Right rectus sheath and in the subcutaneous of the right anterior abdominal wall.  Musculoskeletal: Bone windows reveal no worrisome lytic or sclerotic osseous lesions.  IMPRESSION: 4.2 x 4.4 x 4.8 cm focal collection of debris and gas in the right central mesentery, adjacent to the superior mesenteric vein. This particular location has a different appearance than any of the other fluid seen in the abdomen or pelvis and given the history of leukocytosis, raises concern for evolving abscess, although it does not have a well-defined or enhancing rim at this time.  Status post subtotal colectomy with right abdominal end ileostomy. No evidence for bowel obstruction. There is a small amount of ascites adjacent to the liver and in the right paracolic gutter and subtle peritoneal enhancement is seen along some of the fluid collections although they do not appear to be well organized at this time.  A very small amount of intraperitoneal free air is associated with free air in the rectus sheath and anterior abdominal wall, not unexpected 6 days out from surgery.  Mild dependent atelectasis in the lower lobes bilaterally with small bilateral pleural effusions.  Electronically Signed: By: Eric  Mansell M.D. On: 09/16/2014 15:56   Ct Abdomen Pelvis W Contrast  09/04/2014   CLINICAL DATA:  Ulcerative colitis with intestinal obstruction K51.912 (ICD-10-CM). 73 year old male with past medical history of diabetes, coronary artery disease, history of atrial  fibrillation, history of PE, history of syncope and ulcerative colitis present with exacerbation of ulcerative colitis and found to be in a-fibeval for abd pain and colitis  EXAM: CT ABDOMEN AND PELVIS WITH CONTRAST  TECHNIQUE: Multidetector CT imaging of the abdomen and pelvis was performed using the standard protocol following bolus administration of intravenous contrast.  CONTRAST:  50mL OMNIPAQUE IOHEXO4290m 300 MG/M47m SOLN, [id number] Si<MEASUREME 9[address] Sitest IOHEXOL 300 MG/ML SOLN  COMPARISON:  Plain films  01/13/2009.  Most recent CT of 03/14/2007  FINDINGS: Lower chest: Clear lung bases. Mild cardiomegaly. Right coronary artery atherosclerosis. No pericardial or pleural effusion.  Hepatobiliary: Minimal exclusion of the hepatic dome. Mild hepatic steatosis suspected. No focal liver lesion. Focal steatosis adjacent the falciform ligament. Normal gallbladder, without biliary ductal dilatation.  Pancreas: Mild pancreatic atrophy. A tiny cystic focus in the pancreatic body on image 22 of series 2. Alternatively, this could represent interdigitation of peripancreatic fat. Appearance was likely present on the prior exam, suggesting a benign etiology.  Spleen: Normal  Adrenals/Urinary Tract: Normal adrenal glands. Bilateral renal cysts and too small to characterize lesions. No hydronephrosis. Normal ureters and urinary bladder.  Stomach/Bowel: Normal stomach, without wall thickening. Mild to moderate wall thickening involving the sigmoid and rectum. The descending colon is underdistended, without definite inflammation. There is also wall thickening involving the transverse colon. Hyperemia as evidenced by prominence of the Vasa recta. Ascending colon, terminal ileum, and appendix all normal. Normal small bowel without abdominal ascites.  Vascular/Lymphatic: Moderate aortic and branch vessel atherosclerosis. No retroperitoneal or retrocrural adenopathy. No pelvic adenopathy.  Reproductive:  Mild prostatomegaly.  Other: No significant free  fluid. Fat containing left inguinal hernia.  Musculoskeletal: No acute osseous abnormality. 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  IMPRESSION: 1. Left-sided colitis, likely representing active ulcerative colitis. Infectious could look similar. Ischemia felt unlikely, given distribution and clinical history. 2. Atherosclerosis, including within the coronary arteries. 3. Hepatic steatosis.   Electronically Signed   By: Jeronimo Greaves M.D.   On: 09/04/2014 17:06   Dg Chest Port 1 View  09/22/2014   CLINICAL DATA:  Leukocytosis. Ulcerative colitis. Coronary artery disease.  EXAM: PORTABLE CHEST - 1 VIEW  COMPARISON:  09/19/2014  FINDINGS: Removal of nasogastric tube. A right-sided PICC line terminates at the mid to low SVC.  Mild cardiomegaly. Mild right hemidiaphragm elevation. No pleural fluid. Hyperinflation with chronic interstitial thickening. No lobar consolidation. No congestive failure.  IMPRESSION: Cardiomegaly with COPD/chronic bronchitis. No acute superimposed process.   Electronically Signed   By: Jeronimo Greaves M.D.   On: 09/22/2014 14:47   Dg Chest Port 1 View  09/19/2014   CLINICAL DATA:  Adjustment of central catheter  EXAM: PORTABLE CHEST - 1 VIEW  COMPARISON:  September 12, 2014  FINDINGS: Central catheter tip is in the midportion of the superior vena cava. No pneumothorax. Nasogastric tube tip and side port are below the diaphragm. No pneumothorax. There is no appreciable edema or consolidation. Heart is upper normal in size with pulmonary vascularity within normal limits. No adenopathy. No bone lesions.  IMPRESSION: Tube and catheter positions as described without pneumothorax. No edema or consolidation.   Electronically Signed   By: Bretta Bang M.D.   On: 09/19/2014 10:08   Dg Chest Port 1 View  09/12/2014   CLINICAL DATA:  Confirm PICC line placement.  EXAM: PORTABLE CHEST - 1 VIEW  COMPARISON:  09/06/2014  FINDINGS: There is a right arm PICC  line. The catheter tip appears to be in the right atrium. The catheter could be pulled back 6 cm for positioning in the lower SVC. Low lung volumes. No focal airspace disease. Heart size is stable.  IMPRESSION: PICC line tip in the right atrium.   Electronically Signed   By: Richarda Overlie M.D.   On: 09/12/2014 19:15   Dg Chest Port 1 View  09/06/2014   CLINICAL DATA:  73 year old male status post PICC line placement. Initial encounter.  EXAM: PORTABLE CHEST - 1 VIEW  COMPARISON:  CT Abdomen and Pelvis 09/04/2014.  FINDINGS: Portable AP semi upright view at 1553 hrs. Right side PICC line placed, tip projects just below the carina. Normal cardiac size and mediastinal contours. Visualized tracheal air column is within normal limits. No pneumothorax. Somewhat low lung volumes. Mild increased interstitial markings, stable. Otherwise Allowing for portable technique, the lungs are clear.  IMPRESSION: 1. Right PICC line placed, tip at the SVC level. 2.  No acute cardiopulmonary abnormality.   Electronically Signed   By: Augusto GambleLee  Hall M.D.   On: 09/06/2014 16:21    Microbiology: No results found for this or any previous visit (from the past 240 hour(s)).   Labs: Basic Metabolic Panel:  Recent Labs Lab 09/26/14 0542 09/27/14 0528 09/30/14 0445  NA 133* 134* 134*  K 4.7 4.6 4.1  CL 98 98 97  CO2 25 25 25   GLUCOSE 147* 149* 145*  BUN 19 20 16   CREATININE 0.80 0.73 0.77  CALCIUM 8.6 8.8 9.0  MG 2.0  --   --   PHOS 3.8  --   --    Liver Function Tests:  Recent Labs Lab 09/26/14 0542 09/30/14 0445  AST 17 12  ALT 26 17  ALKPHOS 95 84  BILITOT 0.3 0.2*  PROT 6.5 6.8  ALBUMIN 1.9* 2.2*   No results for input(s): LIPASE, AMYLASE in the last 168 hours. No results for input(s): AMMONIA in the last 168 hours. CBC:  Recent Labs Lab 09/26/14 0542 09/27/14 0528 09/28/14 0615 09/30/14 0445 10/01/14 0500  WBC 18.9* 18.8* 15.5* 11.6* 11.8*  HGB 8.9* 8.8* 8.9* 9.1* 9.2*  HCT 27.9* 27.6* 27.5*  28.7* 28.5*  MCV 93.6 94.8 93.5 93.2 93.8  PLT 933* 798* 820* 747* 695*   Cardiac Enzymes: No results for input(s): CKTOTAL, CKMB, CKMBINDEX, TROPONINI in the last 168 hours. BNP: BNP (last 3 results)  Recent Labs  09/12/14 0520  PROBNP 116.2   CBG:  Recent Labs Lab 09/26/14 0535 09/26/14 1355 09/26/14 2124 09/27/14 0554 09/27/14 1355  GLUCAP 145* 185* 174* 144* 156*       Signed:  Omar Orrego  [facility] 10/01/2014, 11:23 AM

## 2014-10-01 NOTE — Discharge Instructions (Signed)
[facility]      [facility] [phone]  OPEN ABDOMINAL SURGERY: POST OP INSTRUCTIONS  Always review your discharge instruction sheet given to you by the facility where your surgery was performed.  IF YOU HAVE DISABILITY OR FAMILY LEAVE FORMS, YOU MUST BRING THEM TO THE OFFICE FOR PROCESSING.  PLEASE DO NOT GIVE THEM TO YOUR DOCTOR.  1. A prescription for pain medication may be given to you upon discharge.  Take your pain medication as prescribed, if needed.  If narcotic pain medicine is not needed, then you may take acetaminophen (Tylenol) or ibuprofen (Advil) as needed. 2. Take your usually prescribed medications unless otherwise directed. 3. If you need a refill on your pain medication, please contact your pharmacy. They will contact our office to request authorization.  Prescriptions will not be filled after 5pm or on week-ends. 4. You should follow a light diet the first few days after arrival home, such as soup and crackers, pudding, etc.unless your doctor has advised otherwise. A high-fiber, low fat diet can be resumed as tolerated.   Be sure to include lots of fluids daily. Most patients will experience some swelling and bruising on the chest and neck area.  Ice packs will help.  Swelling and bruising can take several days to resolve 5. Most patients will experience some swelling and bruising in the area of the incision. Ice pack will help. Swelling and bruising can take several days to resolve..  6. It is common to experience some constipation if taking pain medication after surgery.  Increasing fluid intake and taking a stool softener will usually help or prevent this problem from occurring.  A mild laxative (Milk of Magnesia or Miralax) should be taken according to package directions if there are no bowel movements after 48 hours. 7.  You may have steri-strips (small skin tapes) in place directly over the incision.  These strips should be left on the skin for 7-10 days.  If your  surgeon used skin glue on the incision, you may shower in 24 hours.  The glue will flake off over the next 2-3 weeks.  Any sutures or staples will be removed at the office during your follow-up visit. You may find that a light gauze bandage over your incision may keep your staples from being rubbed or pulled. You may shower and replace the bandage daily. 8. ACTIVITIES:  You may resume regular (light) daily activities beginning the next day--such as daily self-care, walking, climbing stairs--gradually increasing activities as tolerated.  You may have sexual intercourse when it is comfortable.  Refrain from any heavy lifting or straining until approved by your doctor. a. You may drive when you no longer are taking prescription pain medication, you can comfortably wear a seatbelt, and you can safely maneuver your car and apply brakes b. Return to Work: ___________________________________ 9. You should see your doctor in the office for a follow-up appointment approximately two weeks after your surgery.  Make sure that you call for this appointment within a day or two after you arrive home to insure a convenient appointment time. OTHER INSTRUCTIONS:  _____________________________________________________________ _____________________________________________________________  WHEN TO CALL YOUR DOCTOR: 1. Fever over 101.0 2. Inability to urinate 3. Nausea and/or vomiting 4. Extreme swelling or bruising 5. Continued bleeding from incision. 6. Increased pain, redness, or drainage from the incision. 7. Difficulty swallowing or breathing 8. Muscle cramping or spasms. 9. Numbness or tingling in hands or feet or around lips.  The clinic staff is available to   answer your questions during regular business hours.  Please don't hesitate to call and ask to speak to one of the nurses if you have concerns.  For further questions, please visit [email or web address]   

## 2014-10-01 NOTE — Progress Notes (Signed)
Nurse reviewed discharge instructions with pt and his daughter.  Pt's midline wet to dry dressing was changed prior to discharge so daughter will know how to change dressing.  [name] is a Engineer, civil (consulting)nurse and feels comfortable with changing pt's dressing.  [name] verbalized understanding of discharge instructions, follow up appointments and new medications.  No concerns at time of discharge.  Pt discharging to his daughter's home with home health.

## 2014-10-01 NOTE — Progress Notes (Signed)
21 Days Post-Op  Subjective: He looks really good, and his mood is really good.  Objective: Vital signs in last 24 hours: Temp:  [97.4 F (36.3 C)-98.5 F (36.9 C)] 97.4 F (36.3 C) (11/24 0600) Pulse Rate:  [71-81] 81 (11/24 0600) Resp:  [18] 18 (11/24 0600) BP: (104-123)/(55-69) 123/55 mmHg (11/24 0600) SpO2:  [96 %-98 %] 96 % (11/24 0600) Last BM Date: 09/30/14 600 Po Afebrile, VSS WBC stable and trending down. CT yesterday:  Persistent but slightly improved (approximately 30% decreased in volume) fluid and gas collection in the central right mesentery anterior to the uncinate process of the pancreas and right lateral of the superior mesenteric vein compared to 09/23/2014. 2. Perhaps trace (too small for reliable measurement) locules of extraluminal air and fluid versus thickening along the undersurface of the mesentery of several loops of small bowel in the left lower quadrant. This finding is of uncertain clinical significance. 3. 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 Percutaneous JP drain remains present within the pelvis adjacent to the Hartmann's pouch. Intake/Output from previous day: 11/23 0701 - 11/24 0700 In: 890 [P.O.:600; I.V.:240; IV Piggyback:50] Out: 1480 [Urine:1025; Drains:5; Stool:450] Intake/Output this shift:    General appearance: alert, cooperative and no distress Resp: clear to auscultation bilaterally GI: soft, sore, ileostomy is working, he is tolerating regular diet.  Open wound is cleaning up and looks much better.  Drain removed without difficulty.  Lab Results:   Recent Labs  09/30/14 0445 10/01/14 0500  WBC 11.6* 11.8*  HGB 9.1* 9.2*  HCT 28.7* 28.5*  PLT 747* 695*    BMET  Recent Labs  09/30/14 0445  NA 134*  K 4.1  CL 97  CO2 25  GLUCOSE 145*  BUN 16  CREATININE 0.77  CALCIUM 9.0   PT/INR No results for input(s): LABPROT, INR in the last 72  hours.   Recent Labs Lab 09/26/14 0542 09/30/14 0445  AST 17 12  ALT 26 17  ALKPHOS 95 84  BILITOT 0.3 0.2*  PROT 6.5 6.8  ALBUMIN 1.9* 2.2*     Lipase     Component Value Date/Time   LIPASE 22.0 03/03/2007 0941     Studies/Results: Ct Abdomen Pelvis W Contrast  09/30/2014   CLINICAL DATA:  73 year old male with a history of ulcerative colitis complicated by sigmoid stricture and large bowel obstruction. 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 Recent CT imaging from 09/23/2014 demonstrates a fluid and gas collection in the right abdominal mesentery. Evaluate for stump leak, or progression of intra-abdominal abscess.  EXAM: CT ABDOMEN AND PELVIS WITH CONTRAST  TECHNIQUE: Multidetector CT imaging of the abdomen and pelvis was performed using the standard protocol following bolus administration of intravenous contrast.  CONTRAST:  100mL OMNIPAQUE IOHEXOL 300 MG/ML  SOLN  COMPARISON:  Most recent prior CT abdomen/ pelvis 09/23/2014  FINDINGS: Lower Chest: Progressive atelectasis of bordering on mild consolidation in the bilateral lower lobes compared to prior. Atelectasis is favored. Stable visualized cardiac and mediastinal structures. No pericardial effusion. Unremarkable visualized distal thoracic esophagus.  Abdomen: Unremarkable CT appearance of the stomach, duodenum, spleen, adrenal glands and pancreas. Normal hepatic contour and morphology. Gallbladder is unremarkable. No intra or extrahepatic biliary ductal dilatation.  No evidence of hydronephrosis or nephrolithiasis. The numerous low-attenuation cystic lesions bilaterally. The larger lesions are water attenuation and consistent with a renal cyst. Multiple sub cm lesions bilaterally are too small for accurate characterization but also  statistically likely to represent benign cysts.  Slightly decreased size of the fluid and gas collection in the right central mesentery just anterior of the uncinate  process of the pancreas and right lateral of the superior mesenteric vein. Today, this collection measures 3.6 x 3.9 x 4.1 cm compared to 4.2 x 4.4 x 4.4 cm. This represents an approximately 30% decrease in overall volume. No evidence of ileus or obstruction. Surgical changes of total colectomy with residual Hartmann's pouch in the pelvis. An end ileostomy is present in the right lower quadrant. Mild nonspecific stranding in the mesentery of the left mid abdomen at the level of the left kidney is similar compared to prior. No new abscess or focal fluid collection identified. A percutaneous JP drain is noted adjacent to the Hartmann's pouch in the anatomic pelvis. There may be a few tiny locules of extraluminal air and trace interloop fluid versus inflammatory stranding within the mesentery of several loops of small bowel in the left lower quadrant (axial image 75 series 2). This collection is too small for reliable measurement and of dubious clinical significance.  Bones/Soft Tissues: No acute fracture or aggressive appearing lytic or blastic osseous lesion. Multilevel degenerative disc disease and lower lumbar facet arthropathy without significant interval progression.  Vascular: Atherosclerotic vascular disease without significant stenosis or aneurysmal dilatation.  IMPRESSION: 1. Persistent but slightly improved (approximately 30% decreased in volume) fluid and gas collection in the central right mesentery anterior to the uncinate process of the pancreas and right lateral of the superior mesenteric vein compared to 09/23/2014. 2. Perhaps trace (too small for reliable measurement) locules of extraluminal air and fluid versus thickening along the undersurface of the mesentery of several loops of small bowel in the left lower quadrant. This finding is of uncertain clinical significance. 3. 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 Percutaneous JP drain remains  present within the pelvis adjacent to the Hartmann's pouch. 4. Progressive atelectasis versus early infiltrate in the bilateral lower lobes. 5. Additional ancillary findings as above without significant interval change.   Electronically Signed   By: Malachy MoanHeath  McCullough M.D.   On: 09/30/2014 13:05    Medications: . acetaminophen  650 mg Oral TID  . amoxicillin-clavulanate  1 tablet Oral Q12H  . becaplermin   Topical Q7 days  . citalopram  20 mg Oral Daily  . feeding supplement (RESOURCE BREEZE)  1 Container Oral BID BM  . ferrous sulfate  325 mg Oral QPC lunch  . heparin subcutaneous  5,000 Units Subcutaneous 3 times per day  . lip balm  1 application Topical BID  . mesalamine  1,000 mg Rectal QHS  . metoprolol tartrate  25 mg Oral BID  . multivitamins with iron  1 tablet Oral QHS  . pantoprazole  40 mg Oral Daily  . saccharomyces boulardii  250 mg Oral BID  . sodium chloride  10-40 mL Intracatheter Q12H    Assessment/Plan .Sigmoid stricture - on colonoscopy by Dr. Leone PayorGessner - 09/04/2014  S/p Total abdominal colectomy with end ileostomy, Dr. Harden MoMatt Wakefield, 09/10/14. Concern for stump leak, based on drainage from the JP/CT findings: 4.2 x 4.4 x4.8 cm focal collection of debris and gas in the right central mesentery Left sided JP drain.  2. Anemia - Hgb - 10/6 - 09/07/2014 transfused 09/14/14 3. Malnutrition - Prealbumin- 12.7 On TNA 4. DM 5. CAD 6. Chronic History of A. Fib rate in the 80's. 7. Very hard of hearing 8. DVT  prophylaxis - on no chemoprophylaxis due to UC 9. Rectal bleeding on heparin last 11/10-11/15  (Heparin is off again for rectal bleeding) 10. Rectal drainage 11. Fluid collection beside uncinate process   Plan:  We are working on discharge plans for today or tomorrow.  He is going to his daughters home.  I will order home health, I have the office working on follow up with Dr. Dwain Sarna, he  will need a CT scan/abdomen and pelvis with contrast;  before that.   He will need follow up with Medicine and GI also. I will put info in the AVS as soon as I get it.  LOS: 29 days    JENNINGS,WILLARD 10/01/2014  Agree with above.  Ovidio Kin, MD, [facility] Pager: [phone] Office phone:  [phone]

## 2014-10-02 ENCOUNTER — Other Ambulatory Visit (INDEPENDENT_AMBULATORY_CARE_PROVIDER_SITE_OTHER): Payer: Self-pay | Admitting: General Surgery

## 2014-10-02 DIAGNOSIS — K651 Peritoneal abscess: Principal | ICD-10-CM

## 2014-10-02 DIAGNOSIS — K529 Noninfective gastroenteritis and colitis, unspecified: Secondary | ICD-10-CM

## 2014-10-02 DIAGNOSIS — T814XXA Infection following a procedure, initial encounter: Principal | ICD-10-CM

## 2014-10-02 DIAGNOSIS — IMO0001 Reserved for inherently not codable concepts without codable children: Secondary | ICD-10-CM

## 2014-10-21 ENCOUNTER — Ambulatory Visit
Admission: RE | Admit: 2014-10-21 | Discharge: 2014-10-21 | Disposition: A | Discharge status: Home / Self Care | Payer: Commercial Managed Care - HMO | Source: Ambulatory Visit | Attending: General Surgery | Admitting: General Surgery

## 2014-10-21 DIAGNOSIS — K529 Noninfective gastroenteritis and colitis, unspecified: Secondary | ICD-10-CM

## 2014-10-21 MED ORDER — IOHEXOL 300 MG/ML  SOLN
125.0000 mL | Freq: Once | INTRAMUSCULAR | Status: AC | PRN
  Administered 2014-10-21: 125 mL via INTRAVENOUS

## 2014-10-25 ENCOUNTER — Telehealth: Payer: Self-pay | Admitting: Internal Medicine

## 2014-10-25 NOTE — Telephone Encounter (Signed)
Caller name: Candise BowensJen [facility] pt[facility] Call back number: [phone]   Reason for call:  Pt had OV with Gunnar FusiPaula on 10/26 and was admitted to hospital same day.  Dr. Lessie DingsWhitefield at [facility] wants pt to be seen in 1 to 2 weeks by Dr. Leone PayorGessner only.  Tried to schedule with extender due to availability, but Candise BowensJen stated that Dr. Lessie DingsWhitefield wants him to see Dr. Leone PayorGessner in the 1 to 2 weeks for Hospital Follow up.  Advise.

## 2014-10-25 NOTE — Telephone Encounter (Signed)
Pts appt moved to [employer]ne[employer] IncLori Hvozdovic, PA-C 10/30/14@10 :15am. Pts daughter aware of appt.

## 2014-10-25 NOTE — Telephone Encounter (Signed)
Patient will see Dr. Rhea BeltonPyrtle on 10/29/14 2:30

## 2014-10-29 ENCOUNTER — Ambulatory Visit: Payer: Medicare PPO | Admitting: Internal Medicine

## 2014-10-30 ENCOUNTER — Other Ambulatory Visit (INDEPENDENT_AMBULATORY_CARE_PROVIDER_SITE_OTHER): Payer: Medicare FFS

## 2014-10-30 ENCOUNTER — Ambulatory Visit (INDEPENDENT_AMBULATORY_CARE_PROVIDER_SITE_OTHER): Payer: Medicare PPO | Admitting: Physician Assistant

## 2014-10-30 ENCOUNTER — Encounter: Payer: Self-pay | Admitting: Physician Assistant

## 2014-10-30 VITALS — BP 102/58 | HR 72 | Ht 71.0 in | Wt 219.4 lb

## 2014-10-30 DIAGNOSIS — K51919 Ulcerative colitis, unspecified with unspecified complications: Secondary | ICD-10-CM

## 2014-10-30 DIAGNOSIS — K6289 Other specified diseases of anus and rectum: Secondary | ICD-10-CM

## 2014-10-30 LAB — CBC WITH DIFFERENTIAL/PLATELET
Basophils Absolute: 0 10*3/uL (ref 0.0–0.1)
Basophils Relative: 0.4 % (ref 0.0–3.0)
EOS PCT: 3.2 % (ref 0.0–5.0)
Eosinophils Absolute: 0.3 10*3/uL (ref 0.0–0.7)
HEMATOCRIT: 36 % — AB (ref 39.0–52.0)
Hemoglobin: 11.4 g/dL — ABNORMAL LOW (ref 13.0–17.0)
LYMPHS ABS: 1.8 10*3/uL (ref 0.7–4.0)
Lymphocytes Relative: 17.1 % (ref 12.0–46.0)
MCHC: 31.7 g/dL (ref 30.0–36.0)
MCV: 90.3 fl (ref 78.0–100.0)
MONOS PCT: 8.1 % (ref 3.0–12.0)
Monocytes Absolute: 0.8 10*3/uL (ref 0.1–1.0)
NEUTROS PCT: 71.2 % (ref 43.0–77.0)
Neutro Abs: 7.4 10*3/uL (ref 1.4–7.7)
Platelets: 434 10*3/uL — ABNORMAL HIGH (ref 150.0–400.0)
RBC: 3.99 Mil/uL — ABNORMAL LOW (ref 4.22–5.81)
RDW: 16 % — ABNORMAL HIGH (ref 11.5–15.5)
WBC: 10.5 10*3/uL (ref 4.0–10.5)

## 2014-10-30 LAB — C-REACTIVE PROTEIN: CRP: 1.8 mg/dL (ref 0.5–20.0)

## 2014-10-30 MED ORDER — MESALAMINE 1000 MG RE SUPP: 1000.0000 mg | Freq: Every day | RECTAL | Status: DC

## 2014-10-30 NOTE — Progress Notes (Addendum)
Patient ID: Ronald Mayo, male   DOB: [birth year], 73 y.o.   MRN: [id number]     History of Present Illness:   Ronald Mayo is a 73 year old male with a long-standing history of ulcerative colitis. He had been seen in 2008 by Dr. Jarold MottoPatterson but then established care [place]. [place] Runningiemann [place]. He was seen in our office in October to establish care. He underwent a colonoscopy by Dr. Leone PayorGessner on 09/04/2014 and was noted to have a stricture in the sigmoid colon. He also had severe ulcerative colitis and proctitis. He subsequently had surgery by Dr. Emelia LoronMatthew Wakefield on 2020/05/714. He had a total abdominal colectomy with end ileostomy. He has a portion of rectum. His postop course was complicated with infection. He was recently seen by Dr. Dwain SarnaWakefield for his postop check and was noted to be doing fairly well. It was not felt he had any more infectious issues however he continued to have some bright red blood per rectum and was advised to follow up here. His ileostomy is functioning. He does have some intermittent nausea. He is supplementing his caloric intake with Glucerna. He does note that he has 3-4 episodes of bright red blood per rectum daily. He continues to have tenesmus. He has been on sulfasalazine since he left the hospital.   Past Medical History  Diagnosis Date  . 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 CAD (coronary artery disease)   . Personal history of other malignant neoplasm of skin   . Atrial fibrillation   . 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 Diabetes mellitus     Past Surgical History  Procedure Laterality Date  . Cardiac stents    . Leg surgeries      multiple  . Traumas, umbilical hernia repair    . Colonoscopy N/A 09/04/2014    Procedure: COLONOSCOPY;  Surgeon: Iva Booparl E Gessner, MD;  Location: [facility];  Service: Endoscopy;  Laterality: N/A;  . Colectomy N/A 09/10/2014    Procedure: TOTAL COLECTOMY;  Surgeon: Emelia LoronMatthew Wakefield, MD;  Location: [facility];  Service: General;  Laterality: N/A;  . Permanent ileostomy  09/10/2014    Procedure: PERMANENT ILEOSTOMY;  Surgeon: Emelia LoronMatthew Wakefield, MD;  Location: [facility];  Service: General;;   Family History  Problem Relation Age of Onset  . Colon cancer Paternal Grandfather    History  Substance Use Topics  . Smoking status: Former Games developermoker  . Smokeless tobacco: Never Used  . Alcohol Use: No   Current Outpatient Prescriptions  Medication Sig Dispense Refill  . alum & mag hydroxide-simeth (MAALOX/MYLANTA) 200-200-20 MG/5ML suspension Take 30 mLs by mouth every 6 (six) hours as needed for indigestion or heartburn (or bloating). 355 mL 0  . citalopram (CELEXA) 20 MG tablet Take 20 mg by mouth daily.     . feeding supplement, RESOURCE BREEZE, (RESOURCE BREEZE) LIQD Take 1 Container by mouth 2 (two) times daily between meals. 60 Container 0  . fenofibrate (TRICOR) 145 MG tablet Take 145 mg by mouth daily.     . ferrous sulfate 325 (65 FE) MG tablet Take 1 tablet (325 mg total) by mouth daily after lunch. 30 tablet 3  . glimepiride (AMARYL) 4 MG tablet Take 4 mg by mouth daily before breakfast.     . metoprolol tartrate (LOPRESSOR) 25 MG tablet Take 1 tablet (25 mg total) by mouth 2 (two) times daily. 60 tablet 0  . Multiple Vitamin (MULTI VITAMIN DAILY PO) Take 1 tablet by mouth  daily.     . Multiple Vitamins-Iron (MULTIVITAMINS WITH IRON) TABS tablet Take 1 tablet by mouth at bedtime. 30 tablet 0  . ondansetron (ZOFRAN) 4 MG tablet Take 1 tablet (4 mg total) by mouth every 8 (eight) hours as needed for nausea or vomiting. 30 tablet 0  . oxyCODONE 10 MG TABS Take 1 tablet (10 mg total) by mouth every 4 (four) hours as needed for moderate pain. 40 tablet 0  . pantoprazole (PROTONIX) 40 MG tablet Take 1 tablet (40 mg total) by mouth daily. 30 tablet 11  . promethazine (PHENERGAN) 12.5 MG tablet Take 12.5 mg by mouth every 6 (six) hours as needed for nausea or vomiting.    . saccharomyces boulardii  (FLORASTOR) 250 MG capsule Take 1 capsule (250 mg total) by mouth 2 (two) times daily. 60 capsule 0  . mesalamine (CANASA) 1000 MG suppository Place 1 suppository (1,000 mg total) rectally at bedtime. 30 suppository 2   No current facility-administered medications for this visit.   No Known Allergies    Review of Systems: Gen: Denies any fever, chills, sweats, anorexia, fatigue, weakness, malaise, weight loss, and sleep disorder CV: Denies chest pain, angina, palpitations, syncope, orthopnea, PND, peripheral edema, and claudication. Resp: Denies dyspnea at rest, dyspnea with exercise, cough, sputum, wheezing, coughing up blood, and pleurisy. GI: Denies vomiting blood, jaundice, and fecal incontinence.   Denies dysphagia or odynophagia. GU : Denies urinary burning, blood in urine, urinary frequency, urinary hesitancy, nocturnal urination, and urinary incontinence. MS: Denies joint pain, limitation of movement, and swelling, stiffness, low back pain, extremity pain. Denies muscle weakness, cramps, atrophy.  Derm: Denies rash, itching, dry skin, hives, moles, warts, or unhealing ulcers.  Psych: Denies depression, anxiety, memory loss, suicidal ideation, hallucinations, paranoia, and confusion. Heme: Denies bruising, bleeding, and enlarged lymph nodes. Neuro:  Denies any headaches, dizziness, paresthesia Endo:  Denies any problems with DM, thyroid, adrenal     Studies:   Ct Abdomen Pelvis W Contrast  10/21/2014   CLINICAL DATA:  History of ulcerative colitis with obstruction. Colectomy with ileostomy 07/31/2014. Abdominal pain. Nausea. Rectal bleeding and history of renal stones.  EXAM: CT ABDOMEN AND PELVIS WITH CONTRAST  TECHNIQUE: Multidetector CT imaging of the abdomen and pelvis was performed using the standard protocol following bolus administration of intravenous contrast.  CONTRAST:  OMNIPAQUE IOHEXOL 300 MG/ML  SOLN  COMPARISON:  09/30/2014  FINDINGS: Lower chest: Minimal  improvement in bibasilar atelectasis. Mild cardiomegaly with dense coronary artery atherosclerosis. No pericardial or pleural effusion.  Hepatobiliary: Normal liver and gallbladder, without biliary ductal dilatation.  Pancreas: Normal, without mass or pancreatic ductal dilatation.  Spleen: Normal  Adrenals/Urinary Tract: Normal adrenal glands. Bilateral renal cysts. Other lesions which are too small to characterize. Renal sinus cysts which are greater on the left. No hydronephrosis. scarring in the interpolar left kidney. decompressed urinary bladder.  Stomach/Bowel: Normal stomach, without wall thickening. Status post Hartmann's pouch creation. There is new or increased mucosal hyper enhancement. Example image 77 of series 3. This is relatively mild. Mild surrounding hyperemia is chronic and nonspecific.  Normal small bowel caliber. Status post and ileostomy. There is edema about the distal aspect of the small bowel, likely postoperative. Fluid collection centered just inferior to the pancreatic head is decreased in size and definition. 2.8 x 2.9 cm on image 38 today versus 3.9 x 3.6 cm on the prior exam. No gas identified within.  No new intraperitoneal fluid collections are identified. The equivocal gas identified about  the left pelvic small bowel loops on the prior exam is no longer identified.  Vascular/Lymphatic: Aortic and branch vessel atherosclerosis. No abdominopelvic adenopathy.  Reproductive: Mild prostatomegaly.  No significant free fluid.  Other: Fat containing left inguinal hernia is tiny. Midline laparotomy changes. Persistent fluid and gas about the laparotomy site. Example image 53 of series 3. Fluid collection at the inferior aspect of the laparotomy site measures 2.2 x 2.0 cm on image 69. This is decreased from 3.2 x 2.2 cm on image 70 of the prior.  Musculoskeletal: Bilateral hip osteoarthritis.  IMPRESSION: 1. Status post subtotal colectomy with end ileostomy and Hartmann's pouch. New or  increased mucosal hyperenhancement within the pouch, suspicious for proctitis. 2. Decreased size of fluid collection inferior to the pancreatic head. 3. Decreased fluid collection within the anterior pelvic wall, at the inferior aspect of the midline laparotomy. Cannot exclude infection.   Electronically Signed   By: Jeronimo GreavesKyle  Talbot M.D.   On: 10/21/2014 11:45   Ct Abdomen Pelvis W Contrast  09/30/2014   CLINICAL DATA:  73 year old male with a history of ulcerative colitis complicated by sigmoid stricture and large bowel obstruction. Patient underwent total abdominal colectomy with end ileostomy on 2020/10/1814. Recent CT imaging from 09/23/2014 demonstrates a fluid and gas collection in the right abdominal mesentery. Evaluate for stump leak, or progression of intra-abdominal abscess.  EXAM: CT ABDOMEN AND PELVIS WITH CONTRAST  TECHNIQUE: Multidetector CT imaging of the abdomen and pelvis was performed using the standard protocol following bolus administration of intravenous contrast.  CONTRAST:  100mL OMNIPAQUE IOHEXOL 300 MG/ML  SOLN  COMPARISON:  Most recent prior CT abdomen/ pelvis 09/23/2014  FINDINGS: Lower Chest: Progressive atelectasis of bordering on mild consolidation in the bilateral lower lobes compared to prior. Atelectasis is favored. Stable visualized cardiac and mediastinal structures. No pericardial effusion. Unremarkable visualized distal thoracic esophagus.  Abdomen: Unremarkable CT appearance of the stomach, duodenum, spleen, adrenal glands and pancreas. Normal hepatic contour and morphology. Gallbladder is unremarkable. No intra or extrahepatic biliary ductal dilatation.  No evidence of hydronephrosis or nephrolithiasis. The numerous low-attenuation cystic lesions bilaterally. The larger lesions are water attenuation and consistent with a renal cyst. Multiple sub cm lesions bilaterally are too small for accurate characterization but also statistically likely to represent benign cysts.  Slightly  decreased size of the fluid and gas collection in the right central mesentery just anterior of the uncinate process of the pancreas and right lateral of the superior mesenteric vein. Today, this collection measures 3.6 x 3.9 x 4.1 cm compared to 4.2 x 4.4 x 4.4 cm. This represents an approximately 30% decrease in overall volume. No evidence of ileus or obstruction. Surgical changes of total colectomy with residual Hartmann's pouch in the pelvis. An end ileostomy is present in the right lower quadrant. Mild nonspecific stranding in the mesentery of the left mid abdomen at the level of the left kidney is similar compared to prior. No new abscess or focal fluid collection identified. A percutaneous JP drain is noted adjacent to the Hartmann's pouch in the anatomic pelvis. There may be a few tiny locules of extraluminal air and trace interloop fluid versus inflammatory stranding within the mesentery of several loops of small bowel in the left lower quadrant (axial image 75 series 2). This collection is too small for reliable measurement and of dubious clinical significance.  Bones/Soft Tissues: No acute fracture or aggressive appearing lytic or blastic osseous lesion. Multilevel degenerative disc disease and lower lumbar facet arthropathy without significant  interval progression.  Vascular: Atherosclerotic vascular disease without significant stenosis or aneurysmal dilatation.  IMPRESSION: 1. Persistent but slightly improved (approximately 30% decreased in volume) fluid and gas collection in the central right mesentery anterior to the uncinate process of the pancreas and right lateral of the superior mesenteric vein compared to 09/23/2014. 2. Perhaps trace (too small for reliable measurement) locules of extraluminal air and fluid versus thickening along the undersurface of the mesentery of several loops of small bowel in the left lower quadrant. This finding is of uncertain clinical significance. 3. Surgical changes of  colectomy with rectal Hartmann's pouch and right lower quadrant end ileostomy. Percutaneous JP drain remains present within the pelvis adjacent to the Hartmann's pouch. 4. Progressive atelectasis versus early infiltrate in the bilateral lower lobes. 5. Additional ancillary findings as above without significant interval change.   Electronically Signed   By: Malachy Moan M.D.   On: 09/30/2014 13:05     Physical Exam: General: Pleasant, well developed male in no acute distress Head: Normocephalic and atraumatic Eyes:  sclerae anicteric, conjunctiva pink  Ears: Normal auditory acuity Lungs: Clear throughout to auscultation Heart: Regular rate and rhythm Abdomen: Soft, non distended, non-tender. iliostomy in place Rectal: finger able to be inserted, anoscope partially inserted, red, inflamed tissue, mucus, heme positive Musculoskeletal: Symmetrical with no gross deformities  Extremities: No edema  Neurological: Alert oriented x 4, grossly nonfocal Psychological:  Alert and cooperative. Normal mood and affect  Assessment and Recommendations: 73 year old male with long-standing history of ulcerative colitis status post recent abdominal total colectomy and ileostomy here for follow-up. He continues to have rectal bleeding which is likely due to some ongoing proctitis. At this time we will discontinue his sulfasalazine as it is likely passing into his ileostomy. He will try Canasa suppositories 1g  rectally daily at bedtime. A CBC will be obtained. He will follow up in 3 weeks with Dr. Leone Payor who he would like to establish care with. Patient has been reviewed with Dr. Rhea Belton who was present today.  Ervan Heber, Tollie Pizza PA-C 10/30/2014,  Addendum: Reviewed and agree with management. 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

## 2014-10-30 NOTE — Patient Instructions (Addendum)
Your physician has requested that you go to the basement for the following lab work before leaving today: CBC CRP  You have a follow up visit scheduled with Dr. Leone PayorGessner for 11-22-2014 at 9:15 am. To cancel or reschedule please call [phone].  Stop Sulfasalazine.  Canasa Suppositories was sent to your pharmacy. Please insert one suppository per rectum at bedtime

## 2014-11-04 NOTE — Progress Notes (Signed)
Agree w/ Ms. Hvozdovic's note and mangement.  

## 2014-11-05 ENCOUNTER — Encounter (HOSPITAL_COMMUNITY): Payer: Self-pay | Admitting: Internal Medicine

## 2014-11-18 NOTE — Telephone Encounter (Signed)
Pt seen

## 2014-11-19 ENCOUNTER — Ambulatory Visit (INDEPENDENT_AMBULATORY_CARE_PROVIDER_SITE_OTHER): Payer: Medicare PPO | Admitting: Internal Medicine

## 2014-11-22 ENCOUNTER — Ambulatory Visit (INDEPENDENT_AMBULATORY_CARE_PROVIDER_SITE_OTHER): Payer: Medicare FFS | Admitting: Internal Medicine

## 2014-11-22 ENCOUNTER — Encounter: Payer: Self-pay | Admitting: Internal Medicine

## 2014-11-22 VITALS — BP 106/54 | HR 64 | Ht 71.0 in | Wt 225.1 lb

## 2014-11-22 DIAGNOSIS — D500 Iron deficiency anemia secondary to blood loss (chronic): Secondary | ICD-10-CM | POA: Diagnosis not present

## 2014-11-22 DIAGNOSIS — K51911 Ulcerative colitis, unspecified with rectal bleeding: Secondary | ICD-10-CM

## 2014-11-22 NOTE — Patient Instructions (Addendum)
You have been schedule for a 6 week follow up appointment with Dr. Leone PayorGessner on 01/20/15 at 3:00 pm.   I appreciate the opportunity to care for you. Stan Headarl Gessner, M.D., [facility]

## 2014-11-22 NOTE — Progress Notes (Signed)
Subjective:    Patient ID: Ronald Mayo, male    DOB: 07/14/1941, 74 y.o.   MRN: [id number]  HPI The patient returns for follow-up with his wife and daughter. He is status post a subtotal colectomy his rectum remains for severe ulcerative colitis with stricture. He was seen in mid to late December by one of the physician assistant's, Canasa suppositories were prescribed. He is generally remembering to use this and thinks his bleeding is less. He still has intermittent bleeding from the rectum and sometimes it just leaks without warning. Overall use still weak but getting stronger he is walking is much as he can and is improved. Ileostomy is functioning. There is apparently a peristomal wound is being treated with home health and followed by Dr. Dwain SarnaWakefield as well. No Known Allergies Outpatient Prescriptions Prior to Visit  Medication Sig Dispense Refill  . alum & mag hydroxide-simeth (MAALOX/MYLANTA) 200-200-20 MG/5ML suspension Take 30 mLs by mouth every 6 (six) hours as needed for indigestion or heartburn (or bloating). 355 mL 0  . citalopram (CELEXA) 20 MG tablet Take 20 mg by mouth daily.     . fenofibrate (TRICOR) 145 MG tablet Take 145 mg by mouth daily.     . ferrous sulfate 325 (65 FE) MG tablet Take 1 tablet (325 mg total) by mouth daily after lunch. 30 tablet 3  . glimepiride (AMARYL) 4 MG tablet Take 4 mg by mouth daily before breakfast.     . mesalamine (CANASA) 1000 MG suppository Place 1 suppository (1,000 mg total) rectally at bedtime. 30 suppository 2  . metoprolol tartrate (LOPRESSOR) 25 MG tablet Take 1 tablet (25 mg total) by mouth 2 (two) times daily. 60 tablet 0  . Multiple Vitamins-Iron (MULTIVITAMINS WITH IRON) TABS tablet Take 1 tablet by mouth at bedtime. 30 tablet 0  . ondansetron (ZOFRAN) 4 MG tablet Take 1 tablet (4 mg total) by mouth every 8 (eight) hours as needed for nausea or vomiting. 30 tablet 0  . oxyCODONE 10 MG TABS Take 1 tablet (10 mg total) by mouth every  4 (four) hours as needed for moderate pain. 40 tablet 0  . pantoprazole (PROTONIX) 40 MG tablet Take 1 tablet (40 mg total) by mouth daily. 30 tablet 11  . promethazine (PHENERGAN) 12.5 MG tablet Take 12.5 mg by mouth every 6 (six) hours as needed for nausea or vomiting.    . feeding supplement, RESOURCE BREEZE, (RESOURCE BREEZE) LIQD Take 1 Container by mouth 2 (two) times daily between meals. 60 Container 0  . Multiple Vitamin (MULTI VITAMIN DAILY PO) Take 1 tablet by mouth daily.     Marland Kitchen. saccharomyces boulardii (FLORASTOR) 250 MG capsule Take 1 capsule (250 mg total) by mouth 2 (two) times daily. 60 capsule 0   No facility-administered medications prior to visit.   Past Medical History  Diagnosis Date  . 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 CAD (coronary artery disease)   . Personal history of other malignant neoplasm of skin   . Atrial fibrillation   . 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 Diabetes mellitus    Past Surgical History  Procedure Laterality Date  . Cardiac stents    . Leg surgeries      multiple  . Traumas, umbilical hernia repair    . Colectomy N/A 09/10/2014    Procedure: TOTAL COLECTOMY;  Surgeon: Emelia LoronMatthew Wakefield, MD;  Location: [facility];  Service: General;  Laterality: N/A;  . Permanent ileostomy  09/10/2014  Procedure: PERMANENT ILEOSTOMY;  Surgeon: Emelia Loron, MD;  Location: [facility];  Service: General;;  . Colonoscopy N/A 09/04/2014    Procedure: COLONOSCOPY;  Surgeon: Iva Boop, MD;  Location: [facility];  Service: Endoscopy;  Laterality: N/A;    Review of Systems As per history of present illness    Objective:   Physical Exam General:  NAD Eyes:   anicteric Lungs:  clear Heart:  S1S2 no rubs, murmurs or gallops Abdomen:  soft and nontender, BS+ there is an ileostomy in the right lower quadrant Ext:   no edema      Assessment & Plan:   1. Ulcerative colitis, with rectal bleeding   2. Chronic blood loss anemia     1. Think he is slowly improving. He certainly has not been treated long enough with the Canasa to say it has failed. He will continue using that and try to the more compliant though he is fairly compliant. I advised putting it with his nighttime medication so he remembers to insert 2. I will see him back or one of my physician assistant to see him back in about 6 weeks, they need to Friday appointment and I'll think I have one available due to scheduling issues. 3. I did not recheck his hemoglobin today he has good capillary refill and pink nails and pink palmar creases and a hemoglobin of 11 about 3 weeks ago. 4. Should he not respond to the Canasa we can consider using steroid suppositories. A completion proctectomy would not be easy in this man.  CC: Aniceto Boss, MD And Dr. Emelia Loron

## 2014-11-22 NOTE — Assessment & Plan Note (Signed)
Continue Canasa  

## 2015-01-20 ENCOUNTER — Ambulatory Visit (INDEPENDENT_AMBULATORY_CARE_PROVIDER_SITE_OTHER): Payer: Medicare FFS | Admitting: Internal Medicine

## 2015-01-20 ENCOUNTER — Encounter: Payer: Self-pay | Admitting: Internal Medicine

## 2015-01-20 VITALS — BP 126/60 | HR 76 | Ht 71.0 in | Wt 249.1 lb

## 2015-01-20 DIAGNOSIS — K51211 Ulcerative (chronic) proctitis with rectal bleeding: Secondary | ICD-10-CM

## 2015-01-20 DIAGNOSIS — K51911 Ulcerative colitis, unspecified with rectal bleeding: Secondary | ICD-10-CM | POA: Diagnosis not present

## 2015-01-20 NOTE — Assessment & Plan Note (Addendum)
He is improved though his rectal bleeding is not resolved. I spoke to Dr. Dwain SarnaWakefield and we both think that surgery in this area would be difficult and certainly habits risks. It may come to that, Dr. Dwain SarnaWakefield can consider sending the patient to the colorectal surgeon Dr. Maisie Fushomas but I think at this point we will most likely give the Canasa suppository a couple more months to work. If it doesn't, consider hydrocortisone suppositories. He has made progress. It sounds like the risks of surgery outweigh the potential benefits. I will say that he is significantly improved compared to where he was at the end of last year. We'll see how it goes.

## 2015-01-20 NOTE — Patient Instructions (Addendum)
Per Dr Leone PayorGessner please stay on the canasa suppositories nightly.   Dr. Leone PayorGessner is going to speak to Dr Dwain SarnaWakefield regarding your case.    I appreciate the opportunity to care for you. Stan Headarl Gessner, M.D., [facility]

## 2015-01-20 NOTE — Progress Notes (Signed)
   Subjective:    Patient ID: Ronald Mayo, male    DOB: 09-Apr-1941, 74 y.o.   MRN: [id number] Chief complaint: Follow-up of ulcerative colitis/proctitis HPI The patient is an elderly man here with his daughter, he has a history of chronic ulcerative colitis he developed a sigmoid stricture required a total colectomy at the end of last year. He was very ill at that time but is slowly improved. I saw him in January and he was treated with Canasa suppositories since late December, to try to control his residual proctitis and his rectal stump. There has been improvement, however V still intermittently passes blood. He thinks he uses the Canasa suppository about 75% of the time other times he forgets. He has some intermittent nausea and weakness. Overall he is significantly improved however. Ileostomy is intact and working well he says. Medications, allergies, past medical history, past surgical history, family history and social history are reviewed and updated in the EMR.  Review of Systems As per history of present illness.    Objective:   Physical Exam BP 126/60 mmHg  Pulse 76  Ht 5\' 11"  (1.803 m)  Wt 249 lb 2 oz (113.002 kg)  BMI 34.76 kg/m2 He is an elderly chronically ill white man in no acute distress. Overall he does look more robust and improved.  Rectal exam reveals normal resting anal and rectal tone, the anoderm is normal. There is no mass.  Anoscopy was performed with the patient in the left lateral decubitus position and revealed mild changes of proctitis in the distal rectum.     Assessment & Plan:   Ulcerative colitis, status post colectomy and ileostomy with rectal stump. He is improved though his rectal bleeding is not resolved. I spoke to Dr. Dwain SarnaWakefield and we both think that surgery in this area would be difficult and certainly habits risks. It may come to that, Dr. Dwain SarnaWakefield can consider sending the patient to the colorectal surgeon Dr. Maisie Fushomas but I think at this point  we will most likely give the Canasa suppository a couple more months to work. If it doesn't, consider hydrocortisone suppositories. He has made progress. It sounds like the risks of surgery outweigh the potential benefits. I will say that he is significantly improved compared to where he was at the end of last year. We'll see how it goes.   CC: SETTLE,PAUL C, MD I will also send a copy to Dr. Emelia LoronMatthew Wakefield

## 2015-02-14 ENCOUNTER — Telehealth: Payer: Self-pay | Admitting: Internal Medicine

## 2015-02-14 MED ORDER — HYDROCORTISONE ACETATE 25 MG RE SUPP: 25.0000 mg | Freq: Every day | RECTAL | Status: DC

## 2015-02-14 NOTE — Telephone Encounter (Signed)
Spoke with daughter [name] who reports that Dad is still bleeding so I sent in the new rx as Dr. Leone PayorGessner advised.

## 2015-02-14 NOTE — Telephone Encounter (Signed)
Yes - if he is still bleeding then change from Canasa to hydrocortisone suppository 25 mg qhs  # 30 w/ 2 RF

## 2015-02-14 NOTE — Telephone Encounter (Signed)
Did you speak to Dr. Dwain SarnaWakefield yet as your last note said you would Sir?

## 2015-02-19 ENCOUNTER — Telehealth: Payer: Self-pay

## 2015-02-19 NOTE — Telephone Encounter (Signed)
Anusol HC not covered on patient's insurance.  I spoke with Trinna PostAlex at [employer] , phone # [phone].  They don't cover but the following are available at a good cost per Alex:  GRX-Hicort 25mg  supp. ($9.57) Or Rectacort 25mg  supp.($12.80).  I spoke with patient's wife as he is hard of hearing and she said they could afford either one.  Please advise which rx to send in Sir, thank you.

## 2015-02-20 ENCOUNTER — Other Ambulatory Visit: Payer: Self-pay | Admitting: Internal Medicine

## 2015-02-20 DIAGNOSIS — K51911 Ulcerative colitis, unspecified with rectal bleeding: Secondary | ICD-10-CM

## 2015-02-20 MED ORDER — HYDROCORTISONE ACETATE 25 MG RE SUPP: RECTAL | Status: DC

## 2015-02-20 NOTE — Telephone Encounter (Signed)
Please let them know i changed the suppository Rx ([facility])

## 2015-02-20 NOTE — Telephone Encounter (Signed)
Informed patient's wife that Dr. Leone PayorGessner sent new script for suppository but to call back if this too expensive as well. Patient's wife verbalized understanding.

## 2015-02-20 NOTE — Assessment & Plan Note (Signed)
Re rex HC suppository to GRX-Hicort 25mg  supp which is covered

## 2015-08-06 ENCOUNTER — Telehealth: Payer: Self-pay | Admitting: Internal Medicine

## 2015-08-06 DIAGNOSIS — K625 Hemorrhage of anus and rectum: Secondary | ICD-10-CM

## 2015-08-06 MED ORDER — MESALAMINE 1000 MG RE SUPP: 1000.0000 mg | Freq: Every day | RECTAL | Status: AC

## 2015-08-06 NOTE — Telephone Encounter (Signed)
Patient's daughter notified She wants to have CBC performed at his primary care.  She will contact them and have the results faxed She is also advised to have him pick up and resume canasa nightly

## 2015-08-06 NOTE — Telephone Encounter (Signed)
Yes he should resume the canasa 1000 mg at bedtime Have him get a CBC before next week if we can

## 2015-08-06 NOTE — Telephone Encounter (Signed)
Patient's daughter called to report increase in rectal bleeding.  She was at work and not with Mr. Ronald Mayo, and she says he can't hear to discuss on the phone.  She states that he reports to her a large amount of rectal bleeding again.  He is also having some nausea.  He is scheduled to see Gay Filler, PA on 08/13/15.  Dr. Elzie Rings should he resume canasa until office visit next week?

## 2015-08-13 ENCOUNTER — Encounter: Payer: Self-pay | Admitting: Physician Assistant

## 2015-08-13 ENCOUNTER — Ambulatory Visit (INDEPENDENT_AMBULATORY_CARE_PROVIDER_SITE_OTHER): Payer: Medicare PPO | Admitting: Physician Assistant

## 2015-08-13 VITALS — BP 126/78 | HR 80 | Ht 71.0 in | Wt 255.8 lb

## 2015-08-13 DIAGNOSIS — K6289 Other specified diseases of anus and rectum: Secondary | ICD-10-CM | POA: Diagnosis not present

## 2015-08-13 DIAGNOSIS — K648 Other hemorrhoids: Secondary | ICD-10-CM

## 2015-08-13 MED ORDER — HYDROCORTISONE 2.5 % RE CREA: 1.0000 "application " | TOPICAL_CREAM | Freq: Two times a day (BID) | RECTAL | Status: AC

## 2015-08-13 NOTE — Progress Notes (Signed)
Patient ID: Ronald Mayo, male   DOB: 1941/10/04, 74 y.o.   MRN: [id number]     History of Present Illness: Ronald Mayo is a pleasant 74 year old gentleman known to Dr. Leone Payor. He has a history of chronic ulcerative colitis. He developed a sigmoid stricture and required a total colectomy in November 2015. In January 2016 he was treated with Canasa suppositories due to proctitis. He was last seen in March 2016 for rectal bleeding and advised to continue his Canasa suppositories. He called several weeks later and was experiencing some bleeding and was advised to use hydrocortisone suppositories. Unfortunately, the patient states his insurance didn't approve the suppositories and so he has continued to use Canasa suppositories since then on an as-needed basis. He has good weeks and bad weeks but over the past several weeks has had increasing rectal bleeding with rectal pressure and mucus. He does not have any bowel movements per rectum since his colectomy, but he has been passing small amounts of blood or pinkish mucous daily for the past few weeks. He has intermittent crampy abdominal pain that does not occur on a daily basis and does not interfere with his daily activities. He is accompanied by his daughter. He admits that he does not use his suppositories daily because he sometimes forgets.  Past Medical History  Diagnosis Date  . UC (ulcerative colitis confined to rectum) (HCC)   . CAD (coronary artery disease)   . Personal history of other malignant neoplasm of skin   . Atrial fibrillation (HCC)   . 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 Diabetes mellitus (HCC)   . Ulcerative colitis, status post colectomy and ileostomy with rectal stump. 09/02/2014    Past Surgical History  Procedure Laterality Date  . Cardiac stents    . Leg surgeries      multiple  . Traumas, umbilical hernia repair    . Colectomy N/A 09/10/2014    Procedure: TOTAL COLECTOMY;  Surgeon: Emelia Loron, MD;   Location: [facility];  Service: General;  Laterality: N/A;  . Permanent ileostomy  09/10/2014    Procedure: PERMANENT ILEOSTOMY;  Surgeon: Emelia Loron, MD;  Location: [facility];  Service: General;;  . Colonoscopy N/A 09/04/2014    Procedure: COLONOSCOPY;  Surgeon: Iva Boop, MD;  Location: [facility];  Service: Endoscopy;  Laterality: N/A;   Family History  Problem Relation Age of Onset  . Colon cancer Paternal Grandfather    Social History  Substance Use Topics  . Smoking status: Former Games developer  . Smokeless tobacco: Never Used  . Alcohol Use: No   Current Outpatient Prescriptions  Medication Sig Dispense Refill  . citalopram (CELEXA) 20 MG tablet Take 20 mg by mouth daily.     . fenofibrate (TRICOR) 145 MG tablet Take 145 mg by mouth daily.     . ferrous sulfate 325 (65 FE) MG tablet Take 1 tablet (325 mg total) by mouth daily after lunch. 30 tablet 3  . glimepiride (AMARYL) 4 MG tablet Take 4 mg by mouth daily before breakfast.     . mesalamine (CANASA) 1000 MG suppository Place 1 suppository (1,000 mg total) rectally at bedtime. 30 suppository 2  . metoprolol tartrate (LOPRESSOR) 25 MG tablet Take 1 tablet (25 mg total) by mouth 2 (two) times daily. 60 tablet 0  . Multiple Vitamins-Iron (MULTIVITAMINS WITH IRON) TABS tablet Take 1 tablet by mouth at bedtime. 30 tablet 0  . pantoprazole (PROTONIX) 40 MG tablet Take 1 tablet (40 mg  total) by mouth daily. 30 tablet 11  . hydrocortisone (ANUSOL-HC) 2.5 % rectal cream Place 1 application rectally 2 (two) times daily. Apply rectally twice daily x 10 days. 30 g 1   No current facility-administered medications for this visit.   No Known Allergies   Review of Systems: Gen: Denies any fever, chills, sweats, anorexia, fatigue, weakness, malaise, weight loss, and sleep disorder CV: Denies chest pain, angina, palpitations, syncope, orthopnea, PND, peripheral edema, and claudication. Resp: Denies dyspnea at rest, dyspnea with exercise,  cough, sputum, wheezing, coughing up blood, and pleurisy. GI: Denies vomiting blood, jaundice, and fecal incontinence.   Denies dysphagia or odynophagia. GU : Denies urinary burning, blood in urine, urinary frequency, urinary hesitancy, nocturnal urination, and urinary incontinence. MS: Denies joint pain, limitation of movement, and swelling, stiffness, low back pain, extremity pain. Denies muscle weakness, cramps, atrophy.  Derm: Denies rash, itching, dry skin, hives, moles, warts, or unhealing ulcers.  Psych: Denies depression, anxiety, memory loss, suicidal ideation, hallucinations, paranoia, and confusion. Heme: Denies bruising, bleeding, and enlarged lymph nodes. Neuro:  Denies any headaches, dizziness, paresthesia Endo:  Denies any problems with DM, thyroid, adrenal  LAB RESULTS: CBC from 08/11/2015 from [facility] reveals a white blood count 7.35, hemoglobin 15.7, hematocrit 46.7, MCV 96.7, platelets 279,000.    Physical Exam: BP 126/78 mmHg  Pulse 80  Ht  (1.803 m)  Wt 255 lb 12.8 oz (116.03 kg)  BMI 35.69 kg/m2 General: Pleasant, well developed , male in no acute distress, hard of hearing Head: Normocephalic and atraumatic Eyes:  sclerae anicteric, conjunctiva pink  Ears: Normal auditory acuity Lungs: Clear throughout to auscultation Heart: Regular rate and rhythm Abdomen: Soft, non distended, non-tender. Ileostomy right mid abdomen Rectal: No external hemorrhoids noted. Anoscopy was performed and the patient was noted to have small, purple internal hemorrhoids. Musculoskeletal: Symmetrical with no gross deformities  Extremities: No edema  Neurological: Alert oriented x 4, grossly nonfocal Psychological:  Alert and cooperative. Normal mood and affect  Assessment and Recommendations:  74 year old male with ulcerative colitis status post colectomy and ileostomy with rectal stump here with several weeks of recurrent rectal bleeding and passage of  mucus, suggestive of proctitis. Exam reveals some small hemorrhoids. Patient has been using Canasa suppositories with limited relief. I have spoken with the pharmacist who states the patient's insurance will not cover any form of hydrocortisone suppository, even generic. His insurance will however cover hydrocortisone cream. He will be given a trial of Anusol HC cream to apply rectally twice daily for 10 days. He has been instructed to call us in 7 days to let us know how he is doing.       Derrius Furtick, Tollie Pizza PA-C 08/13/2015,

## 2015-08-13 NOTE — Patient Instructions (Signed)
We sent a prescription to [facility] in [place], [place].  1. Anusol HC Cream.  Call us in 7-10 days to let us now how you are doing.

## 2015-08-17 NOTE — Progress Notes (Signed)
Agree w/ Ms. Hvozdovic's note and mangement.  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  Will see how he does with the HC cream. One other thought would be HC foam - to see if that is covered. Placing the cream on a suppository (hemorrhoidal) could also facilitate delivery.  We are waiting on a call back as to how he is doing.

## 2015-08-29 ENCOUNTER — Telehealth: Payer: Self-pay | Admitting: Physician Assistant

## 2015-08-29 MED ORDER — HYDROCORTISONE ACE-PRAMOXINE 1-1 % RE FOAM: 1.0000 | Freq: Three times a day (TID) | RECTAL | Status: AC

## 2015-08-29 NOTE — Telephone Encounter (Signed)
Spoke with pts daughter and she is aware. Script sent to pharmacy. 

## 2015-08-29 NOTE — Telephone Encounter (Signed)
Pts daughter states the anusol cream has not helped, her father is still having rectal bleeding. Please advise.

## 2015-08-29 NOTE — Telephone Encounter (Signed)
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 He could try Proctocort foam 2 or 3 times daily for 10 days to see if it helps. Otherwise, he should schedule a follow-up with Dr. Leone PayorGessner or a mid-level when DR Leone PayorGessner in office.

## 2016-06-04 IMAGING — CT CT ABD-PELV W/ CM
2 of 5 series · 16 of 46 positions shown, 18 images · IV contrast (OMNIPAQUE)
Comparison: Plain films 01/13/2009.  Most recent CT of 03/14/2007

CLINICAL DATA: Ulcerative colitis with intestinal obstruction
[id number].RGE ([id number]). 73-year-old male with past medical history of
diabetes, coronary artery disease, history of atrial fibrillation,
history of PE, history of syncope and ulcerative colitis present
with exacerbation of ulcerative colitis and found to be in a-fibeval
for abd pain and colitis

EXAM:
CT ABDOMEN AND PELVIS WITH CONTRAST
TECHNIQUE: Multidetector CT imaging of the abdomen and pelvis was performed
using the standard protocol following bolus administration of
intravenous contrast.
CONTRAST:  50mL OMNIPAQUE IOHEXOL 300 MG/ML SOLN, 100mL OMNIPAQUE
IOHEXOL 300 MG/ML SOLN

[Series 2: rtn a/p with · axial · 0.98mm/px · z∈[-400,+40]mm · 13 of 98 slices shown, 15 images]
[im 5/98  soft-tissue]
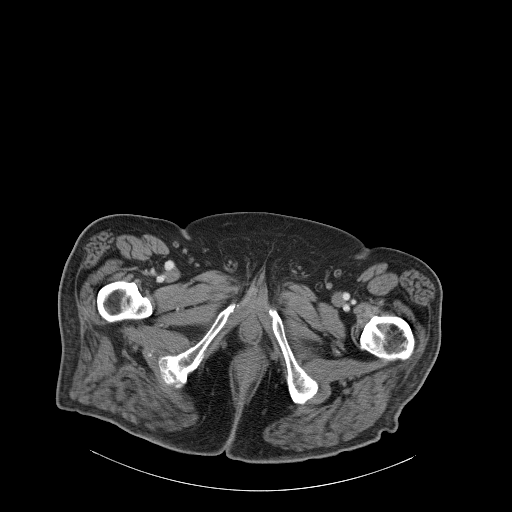
[im 5/98  bone]
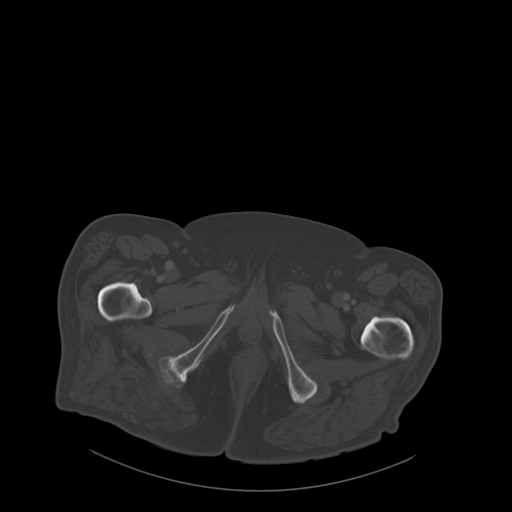
[im 15/98  soft-tissue]
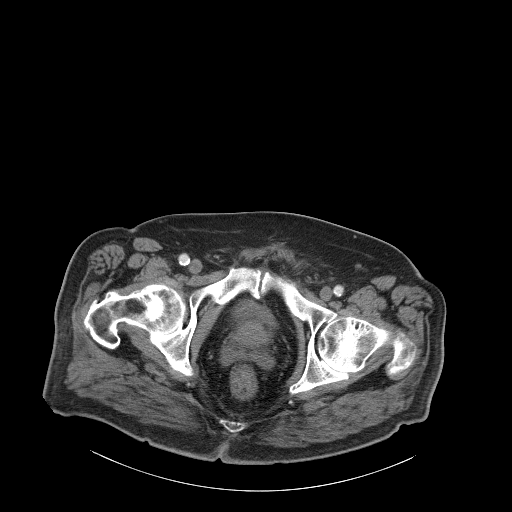
[im 20/98  soft-tissue]
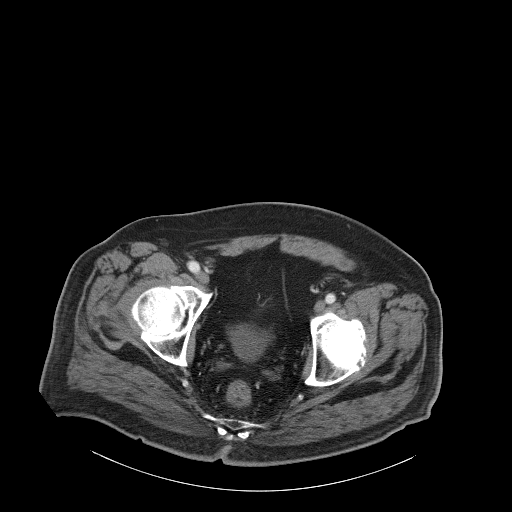
[im 30/98  soft-tissue]
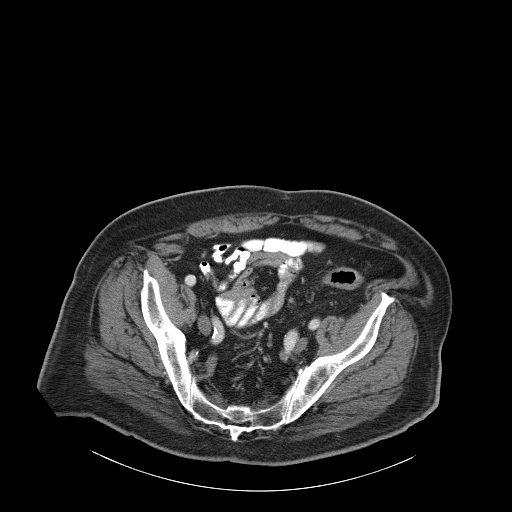
[im 34/98  soft-tissue]
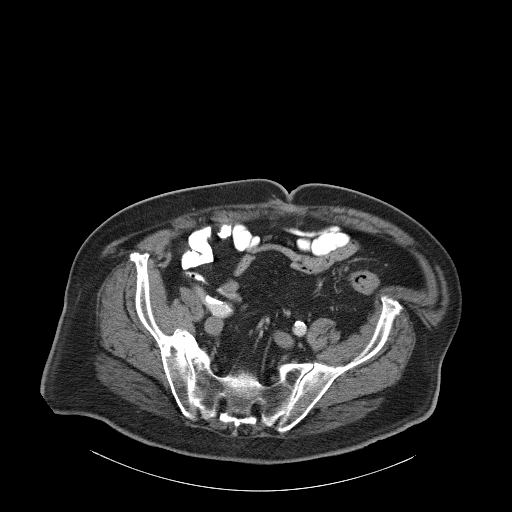
[im 44/98  soft-tissue]
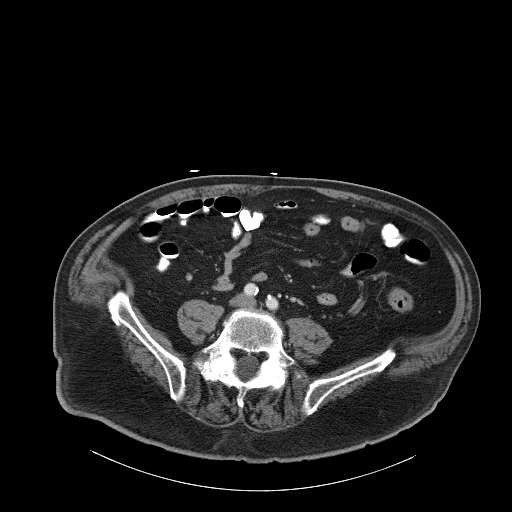
[im 49/98  soft-tissue]
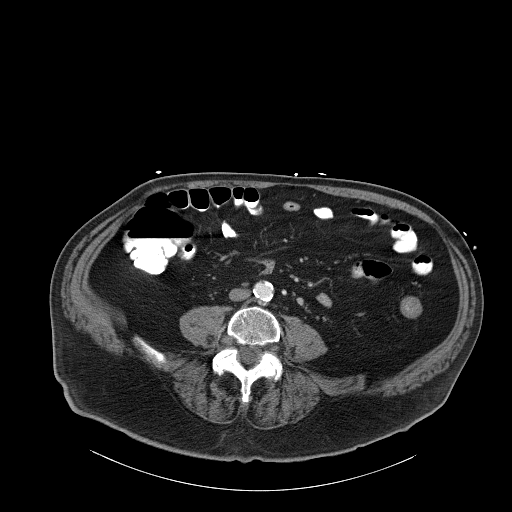
[im 54/98  soft-tissue]
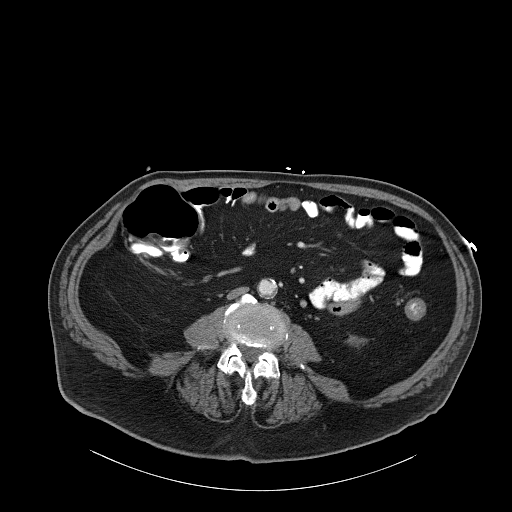
[im 64/98  soft-tissue]
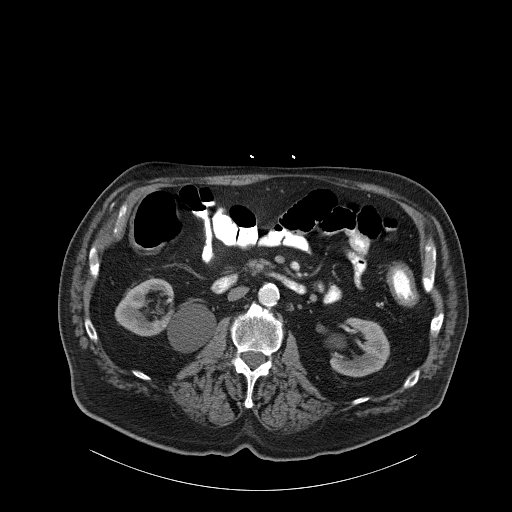
[im 64/98  bone]
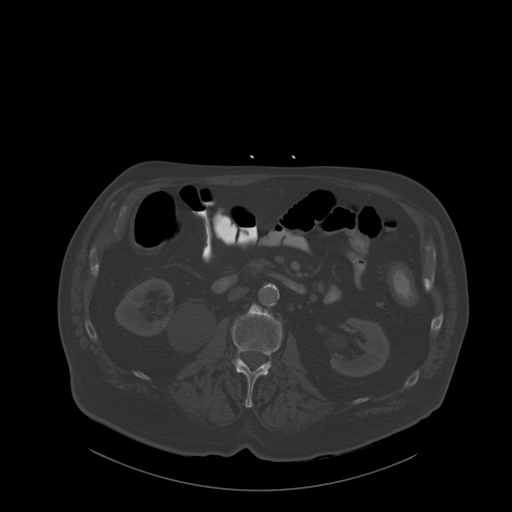
[im 68/98  soft-tissue]
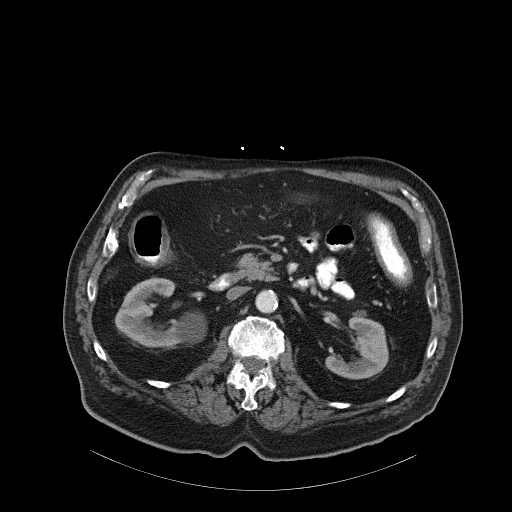
[im 78/98  soft-tissue]
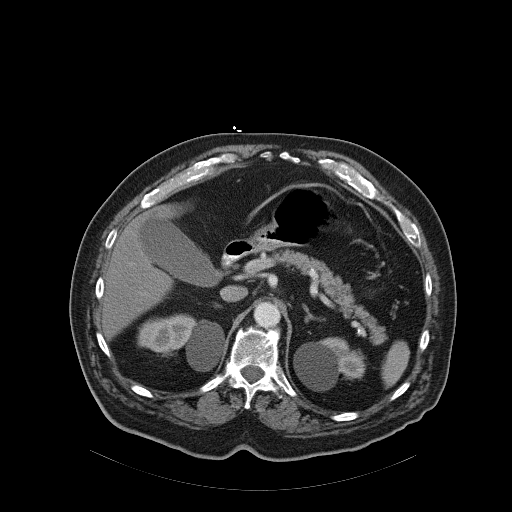
[im 83/98  soft-tissue]
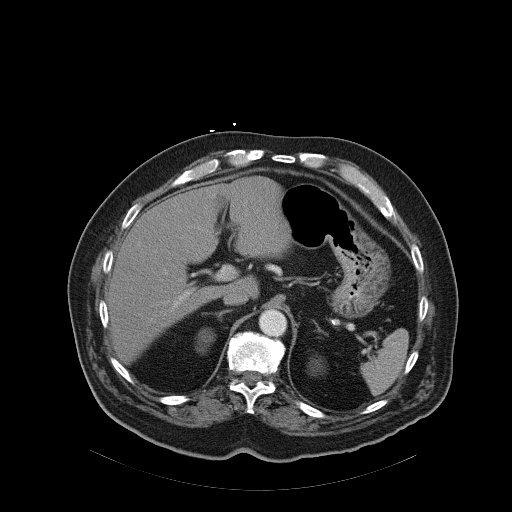
[im 93/98  soft-tissue]
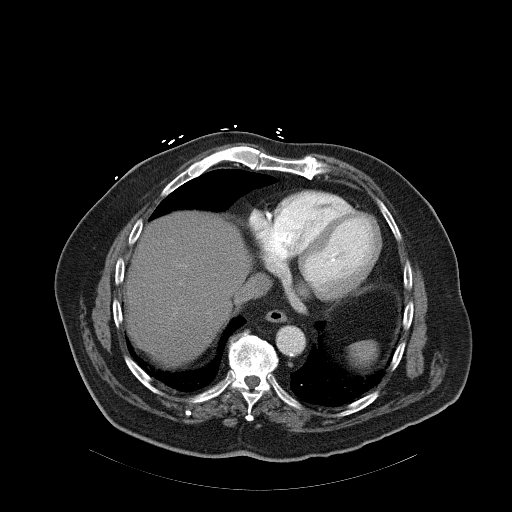

[Series 602: <mpr thick range> · coronal · 0.98mm/px · 3 of 107 slices shown]
[im 36/107  soft-tissue]
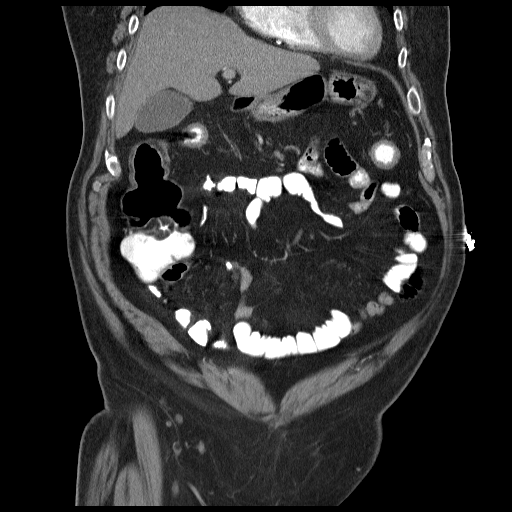
[im 48/107  soft-tissue]
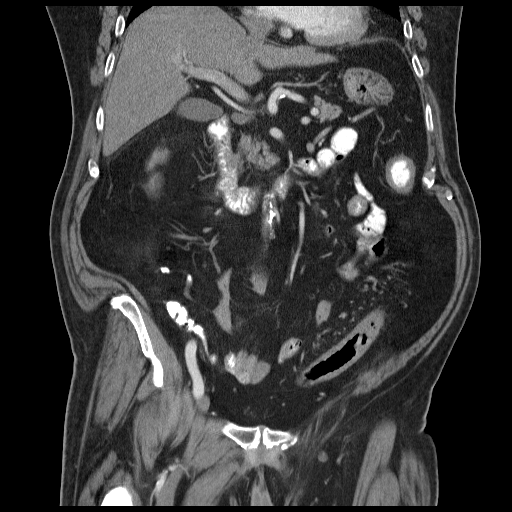
[im 59/107  soft-tissue]
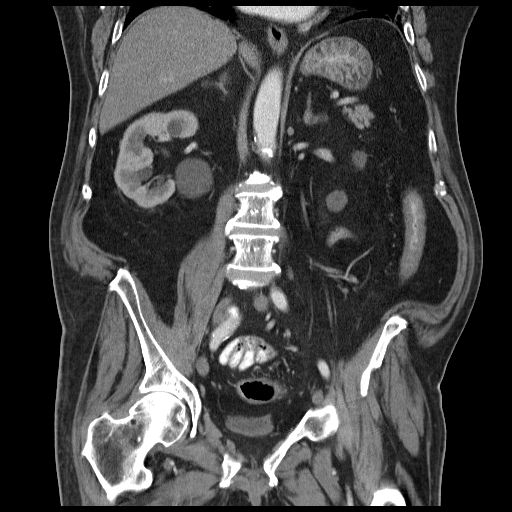

[16 of 46 positions shown; findings below may reference images not displayed]

FINDINGS: Lower chest: Clear lung bases. Mild cardiomegaly. Right coronary
artery atherosclerosis. No pericardial or pleural effusion.

Hepatobiliary: Minimal exclusion of the hepatic dome. Mild hepatic
steatosis suspected. No focal liver lesion. Focal steatosis adjacent
the falciform ligament. Normal gallbladder, without biliary ductal
dilatation.

Pancreas: Mild pancreatic atrophy. A tiny cystic focus in the
pancreatic body on image 22 of series 2. Alternatively, this could
represent interdigitation of peripancreatic fat. Appearance was
likely present on the prior exam, suggesting a benign etiology.

Spleen: Normal

Adrenals/Urinary Tract: Normal adrenal glands. Bilateral renal cysts
and too small to characterize lesions. No hydronephrosis. Normal
ureters and urinary bladder.

Stomach/Bowel: Normal stomach, without wall thickening. Mild to
moderate wall thickening involving the sigmoid and rectum. The
descending colon is underdistended, without definite inflammation.
There is also wall thickening involving the transverse colon.
Hyperemia as evidenced by prominence of the Vasa recta. Ascending
colon, terminal ileum, and appendix all normal. Normal small bowel
without abdominal ascites.

Vascular/Lymphatic: Moderate aortic and branch vessel
atherosclerosis. No retroperitoneal or retrocrural adenopathy. No
pelvic adenopathy.

Reproductive:  Mild prostatomegaly.

Other: No significant free fluid. Fat containing left inguinal
hernia.

Musculoskeletal: No acute osseous abnormality. Bilateral hip
osteoarthritis. Right sacroiliac joint degenerative partial fusion.
IMPRESSION: 1. Left-sided colitis, likely representing active ulcerative
colitis. Infectious could look similar. Ischemia felt unlikely,
given distribution and clinical history.
2. Atherosclerosis, including within the coronary arteries.
3. Hepatic steatosis.

## 2017-06-22 ENCOUNTER — Other Ambulatory Visit (INDEPENDENT_AMBULATORY_CARE_PROVIDER_SITE_OTHER): Payer: Self-pay | Admitting: Internal Medicine

## 2017-06-22 DIAGNOSIS — R188 Other ascites: Secondary | ICD-10-CM

## 2017-06-23 ENCOUNTER — Other Ambulatory Visit (HOSPITAL_COMMUNITY): Payer: Medicare FFS

## 2019-07-19 ENCOUNTER — Inpatient Hospital Stay
Admission: RE | Admit: 2019-07-19 | Discharge: 2019-08-13 | Disposition: A | Discharge status: Skilled Nursing Facility | Payer: Medicare FFS | Source: Other Acute Inpatient Hospital | Attending: Internal Medicine | Admitting: Internal Medicine

## 2019-07-19 DIAGNOSIS — U071 COVID-19: Secondary | ICD-10-CM | POA: Diagnosis present

## 2019-07-19 DIAGNOSIS — J9621 Acute and chronic respiratory failure with hypoxia: Secondary | ICD-10-CM | POA: Diagnosis present

## 2019-07-19 DIAGNOSIS — J84112 Idiopathic pulmonary fibrosis: Secondary | ICD-10-CM | POA: Diagnosis present

## 2019-07-19 DIAGNOSIS — J9690 Respiratory failure, unspecified, unspecified whether with hypoxia or hypercapnia: Secondary | ICD-10-CM

## 2019-07-19 DIAGNOSIS — I5032 Chronic diastolic (congestive) heart failure: Secondary | ICD-10-CM | POA: Diagnosis present

## 2019-07-19 DIAGNOSIS — I482 Chronic atrial fibrillation, unspecified: Secondary | ICD-10-CM | POA: Diagnosis present

## 2019-07-19 DIAGNOSIS — I2699 Other pulmonary embolism without acute cor pulmonale: Secondary | ICD-10-CM | POA: Diagnosis present

## 2019-07-19 HISTORY — DX: Other pulmonary embolism without acute cor pulmonale: I26.99

## 2019-07-19 HISTORY — DX: Chronic diastolic (congestive) heart failure: I50.32

## 2019-07-19 HISTORY — DX: Acute and chronic respiratory failure with hypoxia: J96.21

## 2019-07-19 HISTORY — DX: COVID-19: U07.1

## 2019-07-19 HISTORY — DX: Chronic atrial fibrillation, unspecified: I48.20

## 2019-07-19 HISTORY — DX: Idiopathic pulmonary fibrosis: J84.112

## 2019-07-20 ENCOUNTER — Other Ambulatory Visit (HOSPITAL_COMMUNITY): Payer: Medicare FFS

## 2019-07-20 LAB — COMPREHENSIVE METABOLIC PANEL
ALT: 37 U/L (ref 0–44)
AST: 45 U/L — ABNORMAL HIGH (ref 15–41)
Albumin: 2.8 g/dL — ABNORMAL LOW (ref 3.5–5.0)
Alkaline Phosphatase: 61 U/L (ref 38–126)
Anion gap: 9 (ref 5–15)
BUN: 37 mg/dL — ABNORMAL HIGH (ref 8–23)
CO2: 25 mmol/L (ref 22–32)
Calcium: 8.6 mg/dL — ABNORMAL LOW (ref 8.9–10.3)
Chloride: 101 mmol/L (ref 98–111)
Creatinine, Ser: 1.22 mg/dL (ref 0.61–1.24)
GFR calc Af Amer: >60 mL/min (ref >60)
GFR calc non Af Amer: 57 mL/min — ABNORMAL LOW (ref >60)
Glucose, Bld: 225 mg/dL — ABNORMAL HIGH (ref 70–99)
Potassium: 5.9 mmol/L — ABNORMAL HIGH (ref 3.5–5.1)
Sodium: 135 mmol/L (ref 135–145)
Total Bilirubin: 2.1 mg/dL — ABNORMAL HIGH (ref 0.3–1.2)
Total Protein: 5.4 g/dL — ABNORMAL LOW (ref 6.5–8.1)

## 2019-07-20 LAB — CBC WITH DIFFERENTIAL/PLATELET
Abs Immature Granulocytes: 0.08 10*3/uL — ABNORMAL HIGH (ref 0.00–0.07)
Basophils Absolute: 0 10*3/uL (ref 0.0–0.1)
Basophils Relative: 0 %
Eosinophils Absolute: 0 10*3/uL (ref 0.0–0.5)
Eosinophils Relative: 0 %
HCT: 39.4 % (ref 39.0–52.0)
Hemoglobin: 13.5 g/dL (ref 13.0–17.0)
Immature Granulocytes: 1 %
Lymphocytes Relative: 9 %
Lymphs Abs: 1 10*3/uL (ref 0.7–4.0)
MCH: 34.1 pg — ABNORMAL HIGH (ref 26.0–34.0)
MCHC: 34.3 g/dL (ref 30.0–36.0)
MCV: 99.5 fL (ref 80.0–100.0)
Monocytes Absolute: 0.9 10*3/uL (ref 0.1–1.0)
Monocytes Relative: 8 %
Neutro Abs: 9.8 10*3/uL — ABNORMAL HIGH (ref 1.7–7.7)
Neutrophils Relative %: 82 %
Platelets: 164 10*3/uL (ref 150–400)
RBC: 3.96 MIL/uL — ABNORMAL LOW (ref 4.22–5.81)
RDW: 15.3 % (ref 11.5–15.5)
WBC: 11.9 10*3/uL — ABNORMAL HIGH (ref 4.0–10.5)
nRBC: 0 % (ref 0.0–0.2)

## 2019-07-20 LAB — HEMOGLOBIN A1C
Hgb A1c MFr Bld: 8 % — ABNORMAL HIGH (ref 4.8–5.6)
Mean Plasma Glucose: 182.9 mg/dL

## 2019-07-20 LAB — PROTIME-INR
INR: 1.2 (ref 0.8–1.2)
Prothrombin Time: 15 seconds (ref 11.4–15.2)

## 2019-07-20 LAB — PHOSPHORUS: Phosphorus: 2.8 mg/dL (ref 2.5–4.6)

## 2019-07-20 LAB — MAGNESIUM: Magnesium: 1.9 mg/dL (ref 1.7–2.4)

## 2019-07-21 LAB — POTASSIUM: Potassium: 4.6 mmol/L (ref 3.5–5.1)

## 2019-07-23 DIAGNOSIS — U071 COVID-19: Secondary | ICD-10-CM

## 2019-07-23 DIAGNOSIS — I5032 Chronic diastolic (congestive) heart failure: Secondary | ICD-10-CM

## 2019-07-23 DIAGNOSIS — J9621 Acute and chronic respiratory failure with hypoxia: Secondary | ICD-10-CM | POA: Diagnosis not present

## 2019-07-23 DIAGNOSIS — I2699 Other pulmonary embolism without acute cor pulmonale: Secondary | ICD-10-CM | POA: Diagnosis not present

## 2019-07-23 DIAGNOSIS — I482 Chronic atrial fibrillation, unspecified: Secondary | ICD-10-CM | POA: Diagnosis not present

## 2019-07-23 DIAGNOSIS — J84112 Idiopathic pulmonary fibrosis: Secondary | ICD-10-CM

## 2019-07-23 LAB — CBC
HCT: 41.9 % (ref 39.0–52.0)
Hemoglobin: 14 g/dL (ref 13.0–17.0)
MCH: 34.1 pg — ABNORMAL HIGH (ref 26.0–34.0)
MCHC: 33.4 g/dL (ref 30.0–36.0)
MCV: 102.2 fL — ABNORMAL HIGH (ref 80.0–100.0)
Platelets: 182 10*3/uL (ref 150–400)
RBC: 4.1 MIL/uL — ABNORMAL LOW (ref 4.22–5.81)
RDW: 15.9 % — ABNORMAL HIGH (ref 11.5–15.5)
WBC: 13.8 10*3/uL — ABNORMAL HIGH (ref 4.0–10.5)
nRBC: 0 % (ref 0.0–0.2)

## 2019-07-23 LAB — BASIC METABOLIC PANEL
Anion gap: 10 (ref 5–15)
BUN: 37 mg/dL — ABNORMAL HIGH (ref 8–23)
CO2: 27 mmol/L (ref 22–32)
Calcium: 8.9 mg/dL (ref 8.9–10.3)
Chloride: 101 mmol/L (ref 98–111)
Creatinine, Ser: 1.12 mg/dL (ref 0.61–1.24)
GFR calc Af Amer: >60 mL/min (ref >60)
GFR calc non Af Amer: >60 mL/min (ref >60)
Glucose, Bld: 201 mg/dL — ABNORMAL HIGH (ref 70–99)
Potassium: 4.5 mmol/L (ref 3.5–5.1)
Sodium: 138 mmol/L (ref 135–145)

## 2019-07-23 LAB — MAGNESIUM: Magnesium: 1.6 mg/dL — ABNORMAL LOW (ref 1.7–2.4)

## 2019-07-23 NOTE — Consult Note (Signed)
Pulmonary Critical Care Medicine [place]  PULMONARY SERVICE  Date of Service: 07/23/2019  PULMONARY CRITICAL CARE CONSULT   YASHAR INCLAN  JQB:[id number]  DOB: May 23, 1941   DOA: 07/19/2019  Referring Physician: Merton Border, MD  HPI: Ronald Mayo is a 78 y.o. male seen for follow up of Acute on Chronic Respiratory Failure.  Patient has multiple medical problems including chronic interstitial fibrosis oxygen requiring respiratory failure iron deficiency anemia hyperlipidemia type 2 diabetes coronary artery disease chronic diastolic heart failure who presented to the hospital because of increasing shortness of breath.  Patient was found to have COVID-19 pneumonia leading into respiratory failure.  Patient received gram does appear ceftriaxone convalescent plasma azithromycin along with Decadron.  Patient has been having increased oxygen requirement since then transferred to our facility for further management and weaning.  Other complications included development of congestive heart failure requiring diuresis.  Patient also had urinary tract infection.  He was rechecked for COVID-19 and was tested as negative.  Review of Systems:  ROS performed and is unremarkable other than noted above.  Past Medical History:  Diagnosis Date  . Atrial fibrillation ([place])   . CAD (coronary artery disease)   . Diabetes mellitus ([place])   . Personal history of other malignant neoplasm of skin   . 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 UC (ulcerative colitis confined to rectum) ([place])   . Ulcerative colitis, status post colectomy and ileostomy with rectal stump. 09/02/2014    Past Surgical History:  Procedure Laterality Date  . Cardiac stents    . COLECTOMY N/A 09/10/2014   Procedure: TOTAL COLECTOMY;  Surgeon: Rolm Bookbinder, MD;  Location: [facility];  Service: General;  Laterality: N/A;  . COLONOSCOPY N/A 09/04/2014   Procedure: COLONOSCOPY;  Surgeon: Gatha Mayer, MD;  Location:  [facility];  Service: Endoscopy;  Laterality: N/A;  . leg surgeries     multiple  . PERMANENT ILEOSTOMY  09/10/2014   Procedure: PERMANENT ILEOSTOMY;  Surgeon: Rolm Bookbinder, MD;  Location: [facility];  Service: General;;  . traumas, umbilical hernia repair      Social History:    reports that he has quit smoking. He has never used smokeless tobacco. He reports that he does not drink alcohol or use drugs.  Family History: Non-Contributory to the present illness  No Known Allergies  Medications: Reviewed on Rounds  Physical Exam:  Vitals: Temperature 97.1 pulse 73 respiratory 18 blood pressure 126/79 saturations 96%  Ventilator Settings patient is on nasal cannula at this time  . General: Comfortable at this time . Eyes: Grossly normal lids, irises & conjunctiva . ENT: grossly tongue is normal . Neck: no obvious mass . Cardiovascular: S1-S2 normal no gallop or rub . Respiratory: No rhonchi no rales . Abdomen: Soft nontender . Skin: no rash seen on limited exam . Musculoskeletal: not rigid . Psychiatric:unable to assess . Neurologic: no seizure no involuntary movements         Labs on Admission:  Basic Metabolic Panel: Recent Labs  Lab 07/20/19 1750 07/21/19 2030 07/23/19 1020  NA 135  --  138  K 5.9* 4.6 4.5  CL 101  --  101  CO2 25  --  27  GLUCOSE 225*  --  201*  BUN 37*  --  37*  CREATININE 1.22  --  1.12  CALCIUM 8.6*  --  8.9  MG 1.9  --  1.6*  PHOS 2.8  --   --  No results for input(s): PHART, PCO2ART, PO2ART, HCO3, O2SAT in the last 168 hours.  Liver Function Tests: Recent Labs  Lab 07/20/19 1750  AST 45*  ALT 37  ALKPHOS 61  BILITOT 2.1*  PROT 5.4*  ALBUMIN 2.8*   No results for input(s): LIPASE, AMYLASE in the last 168 hours. No results for input(s): AMMONIA in the last 168 hours.  CBC: Recent Labs  Lab 07/20/19 1750 07/23/19 1020  WBC 11.9* 13.8*  NEUTROABS 9.8*  --   HGB 13.5 14.0  HCT 39.4 41.9  MCV 99.5 102.2*  PLT  164 182    Cardiac Enzymes: No results for input(s): CKTOTAL, CKMB, CKMBINDEX, TROPONINI in the last 168 hours.  BNP (last 3 results) No results for input(s): BNP in the last 8760 hours.  ProBNP (last 3 results) No results for input(s): PROBNP in the last 8760 hours.   Radiological Exams on Admission: Dg Chest Port 1 View  Result Date: 07/20/2019 CLINICAL DATA:  Respiratory failure. EXAM: PORTABLE CHEST 1 VIEW COMPARISON:  Radiograph September 22, 2014. FINDINGS: Stable cardiomegaly. No pneumothorax is noted. Right-sided PICC line is unchanged in position. No significant pleural effusion is noted. Minimal bibasilar subsegmental atelectasis is noted. Bony thorax is unremarkable. IMPRESSION: Minimal bibasilar subsegmental atelectasis. Electronically Signed   By: Lupita RaiderJames  Green Jr M.D.   On: 07/20/2019 08:11    Assessment/Plan Active Problems:   Acute on chronic respiratory failure with hypoxia (HCC)   Bilateral pulmonary embolism (HCC)   Idiopathic pulmonary fibrosis (HCC)   Chronic atrial fibrillation   COVID-19 virus infection   Chronic diastolic heart failure (HCC)   1. Acute on chronic respiratory failure with hypoxia at this time patient is only on nasal cannula oxygen we will continue with nasal cannula titrate as tolerated continue supportive care pulmonary toilet 2. Bilateral pulmonary embolism chronic patient has been on anticoagulation with Eliquis in the past however patient had some hematuria and that has been on hold.  The primary care team was resuming the Eliquis because of the pulmonary embolism. 3. Idiopathic pulmonary fibrosis patient is on O FEV we will continue with on supportive care. 4. Chronic atrial fibrillation rate controlled continue present management 5. COVID-19 infection patient now is in recovery phase 6. Chronic diastolic heart failure compensated at baseline we will continue to follow along.  I have personally seen and evaluated the patient, evaluated  laboratory and imaging results, formulated the assessment and plan and placed orders. The Patient requires high complexity decision making for assessment and support.  Case was discussed on Rounds with the Respiratory Therapy Staff Time Spent 70minutes  Yevonne PaxSaadat A Riah Kehoe, MD [facility] Pulmonary Critical Care Medicine Sleep Medicine

## 2019-07-24 LAB — MAGNESIUM: Magnesium: 1.9 mg/dL (ref 1.7–2.4)

## 2019-07-26 LAB — MAGNESIUM: Magnesium: 1.8 mg/dL (ref 1.7–2.4)

## 2019-07-26 LAB — PHOSPHORUS: Phosphorus: 4.1 mg/dL (ref 2.5–4.6)

## 2019-07-26 LAB — URINALYSIS, ROUTINE W REFLEX MICROSCOPIC
Bilirubin Urine: NEGATIVE
Glucose, UA: NEGATIVE mg/dL
Ketones, ur: 5 mg/dL — AB
Nitrite: NEGATIVE
Protein, ur: 100 mg/dL — AB
RBC / HPF: >50 RBC/hpf — ABNORMAL HIGH (ref 0–5)
Specific Gravity, Urine: 1.019 (ref 1.005–1.030)
pH: 6 (ref 5.0–8.0)

## 2019-07-26 LAB — CBC
HCT: 42.3 % (ref 39.0–52.0)
Hemoglobin: 14 g/dL (ref 13.0–17.0)
MCH: 33.9 pg (ref 26.0–34.0)
MCHC: 33.1 g/dL (ref 30.0–36.0)
MCV: 102.4 fL — ABNORMAL HIGH (ref 80.0–100.0)
Platelets: 175 10*3/uL (ref 150–400)
RBC: 4.13 MIL/uL — ABNORMAL LOW (ref 4.22–5.81)
RDW: 17 % — ABNORMAL HIGH (ref 11.5–15.5)
WBC: 14.9 10*3/uL — ABNORMAL HIGH (ref 4.0–10.5)
nRBC: 0 % (ref 0.0–0.2)

## 2019-07-26 LAB — BASIC METABOLIC PANEL
Anion gap: 12 (ref 5–15)
BUN: 48 mg/dL — ABNORMAL HIGH (ref 8–23)
CO2: 22 mmol/L (ref 22–32)
Calcium: 8.8 mg/dL — ABNORMAL LOW (ref 8.9–10.3)
Chloride: 100 mmol/L (ref 98–111)
Creatinine, Ser: 1.35 mg/dL — ABNORMAL HIGH (ref 0.61–1.24)
GFR calc Af Amer: 58 mL/min — ABNORMAL LOW (ref >60)
GFR calc non Af Amer: 50 mL/min — ABNORMAL LOW (ref >60)
Glucose, Bld: 140 mg/dL — ABNORMAL HIGH (ref 70–99)
Potassium: 5 mmol/L (ref 3.5–5.1)
Sodium: 134 mmol/L — ABNORMAL LOW (ref 135–145)

## 2019-07-27 ENCOUNTER — Encounter: Payer: Self-pay | Admitting: Internal Medicine

## 2019-07-27 DIAGNOSIS — I5032 Chronic diastolic (congestive) heart failure: Secondary | ICD-10-CM | POA: Diagnosis present

## 2019-07-27 DIAGNOSIS — U071 COVID-19: Secondary | ICD-10-CM | POA: Diagnosis present

## 2019-07-27 DIAGNOSIS — J9621 Acute and chronic respiratory failure with hypoxia: Secondary | ICD-10-CM | POA: Diagnosis present

## 2019-07-27 DIAGNOSIS — I2699 Other pulmonary embolism without acute cor pulmonale: Secondary | ICD-10-CM | POA: Diagnosis present

## 2019-07-27 DIAGNOSIS — I482 Chronic atrial fibrillation, unspecified: Secondary | ICD-10-CM | POA: Diagnosis present

## 2019-07-27 DIAGNOSIS — J84112 Idiopathic pulmonary fibrosis: Secondary | ICD-10-CM | POA: Diagnosis present

## 2019-07-27 LAB — RENAL FUNCTION PANEL
Albumin: 2.4 g/dL — ABNORMAL LOW (ref 3.5–5.0)
Anion gap: 10 (ref 5–15)
BUN: 41 mg/dL — ABNORMAL HIGH (ref 8–23)
CO2: 23 mmol/L (ref 22–32)
Calcium: 8.2 mg/dL — ABNORMAL LOW (ref 8.9–10.3)
Chloride: 100 mmol/L (ref 98–111)
Creatinine, Ser: 1.15 mg/dL (ref 0.61–1.24)
GFR calc Af Amer: >60 mL/min (ref >60)
GFR calc non Af Amer: >60 mL/min (ref >60)
Glucose, Bld: 228 mg/dL — ABNORMAL HIGH (ref 70–99)
Phosphorus: 2.9 mg/dL (ref 2.5–4.6)
Potassium: 4.3 mmol/L (ref 3.5–5.1)
Sodium: 133 mmol/L — ABNORMAL LOW (ref 135–145)

## 2019-07-27 LAB — MAGNESIUM: Magnesium: 1.8 mg/dL (ref 1.7–2.4)

## 2019-07-28 LAB — CBC
HCT: 38.7 % — ABNORMAL LOW (ref 39.0–52.0)
Hemoglobin: 12.6 g/dL — ABNORMAL LOW (ref 13.0–17.0)
MCH: 33.9 pg (ref 26.0–34.0)
MCHC: 32.6 g/dL (ref 30.0–36.0)
MCV: 104 fL — ABNORMAL HIGH (ref 80.0–100.0)
Platelets: 159 10*3/uL (ref 150–400)
RBC: 3.72 MIL/uL — ABNORMAL LOW (ref 4.22–5.81)
RDW: 17.2 % — ABNORMAL HIGH (ref 11.5–15.5)
WBC: 10 10*3/uL (ref 4.0–10.5)
nRBC: 0 % (ref 0.0–0.2)

## 2019-07-28 LAB — BASIC METABOLIC PANEL
Anion gap: 12 (ref 5–15)
BUN: 30 mg/dL — ABNORMAL HIGH (ref 8–23)
CO2: 22 mmol/L (ref 22–32)
Calcium: 8.3 mg/dL — ABNORMAL LOW (ref 8.9–10.3)
Chloride: 103 mmol/L (ref 98–111)
Creatinine, Ser: 1.03 mg/dL (ref 0.61–1.24)
GFR calc Af Amer: >60 mL/min (ref >60)
GFR calc non Af Amer: >60 mL/min (ref >60)
Glucose, Bld: 187 mg/dL — ABNORMAL HIGH (ref 70–99)
Potassium: 4.3 mmol/L (ref 3.5–5.1)
Sodium: 137 mmol/L (ref 135–145)

## 2019-07-28 LAB — URINE CULTURE: Culture: 50000 — AB

## 2019-07-28 LAB — MAGNESIUM: Magnesium: 1.8 mg/dL (ref 1.7–2.4)

## 2019-08-01 LAB — RENAL FUNCTION PANEL
Albumin: 2 g/dL — ABNORMAL LOW (ref 3.5–5.0)
Anion gap: 8 (ref 5–15)
BUN: 16 mg/dL (ref 8–23)
CO2: 23 mmol/L (ref 22–32)
Calcium: 8.3 mg/dL — ABNORMAL LOW (ref 8.9–10.3)
Chloride: 107 mmol/L (ref 98–111)
Creatinine, Ser: 1.02 mg/dL (ref 0.61–1.24)
GFR calc Af Amer: >60 mL/min (ref >60)
GFR calc non Af Amer: >60 mL/min (ref >60)
Glucose, Bld: 104 mg/dL — ABNORMAL HIGH (ref 70–99)
Phosphorus: 3 mg/dL (ref 2.5–4.6)
Potassium: 4.7 mmol/L (ref 3.5–5.1)
Sodium: 138 mmol/L (ref 135–145)

## 2019-08-01 LAB — MAGNESIUM: Magnesium: 1.5 mg/dL — ABNORMAL LOW (ref 1.7–2.4)

## 2019-08-01 LAB — CBC
HCT: 36.3 % — ABNORMAL LOW (ref 39.0–52.0)
Hemoglobin: 12.1 g/dL — ABNORMAL LOW (ref 13.0–17.0)
MCH: 35.1 pg — ABNORMAL HIGH (ref 26.0–34.0)
MCHC: 33.3 g/dL (ref 30.0–36.0)
MCV: 105.2 fL — ABNORMAL HIGH (ref 80.0–100.0)
Platelets: 168 10*3/uL (ref 150–400)
RBC: 3.45 MIL/uL — ABNORMAL LOW (ref 4.22–5.81)
RDW: 17.9 % — ABNORMAL HIGH (ref 11.5–15.5)
WBC: 10 10*3/uL (ref 4.0–10.5)
nRBC: 0.2 % (ref 0.0–0.2)

## 2019-08-02 LAB — MAGNESIUM: Magnesium: 1.6 mg/dL — ABNORMAL LOW (ref 1.7–2.4)

## 2019-08-03 LAB — MAGNESIUM: Magnesium: 1.7 mg/dL (ref 1.7–2.4)

## 2019-08-04 LAB — BASIC METABOLIC PANEL
Anion gap: 7 (ref 5–15)
BUN: 21 mg/dL (ref 8–23)
CO2: 27 mmol/L (ref 22–32)
Calcium: 9.2 mg/dL (ref 8.9–10.3)
Chloride: 106 mmol/L (ref 98–111)
Creatinine, Ser: 0.98 mg/dL (ref 0.61–1.24)
GFR calc Af Amer: >60 mL/min (ref >60)
GFR calc non Af Amer: >60 mL/min (ref >60)
Glucose, Bld: 208 mg/dL — ABNORMAL HIGH (ref 70–99)
Potassium: 5.1 mmol/L (ref 3.5–5.1)
Sodium: 140 mmol/L (ref 135–145)

## 2019-08-04 LAB — MAGNESIUM: Magnesium: 1.6 mg/dL — ABNORMAL LOW (ref 1.7–2.4)

## 2019-08-05 LAB — BASIC METABOLIC PANEL
Anion gap: 10 (ref 5–15)
BUN: 23 mg/dL (ref 8–23)
CO2: 26 mmol/L (ref 22–32)
Calcium: 9 mg/dL (ref 8.9–10.3)
Chloride: 101 mmol/L (ref 98–111)
Creatinine, Ser: 0.85 mg/dL (ref 0.61–1.24)
GFR calc Af Amer: >60 mL/min (ref >60)
GFR calc non Af Amer: >60 mL/min (ref >60)
Glucose, Bld: 217 mg/dL — ABNORMAL HIGH (ref 70–99)
Potassium: 5 mmol/L (ref 3.5–5.1)
Sodium: 137 mmol/L (ref 135–145)

## 2019-08-05 LAB — MAGNESIUM: Magnesium: 1.6 mg/dL — ABNORMAL LOW (ref 1.7–2.4)

## 2019-08-06 LAB — BASIC METABOLIC PANEL
Anion gap: 11 (ref 5–15)
BUN: 25 mg/dL — ABNORMAL HIGH (ref 8–23)
CO2: 27 mmol/L (ref 22–32)
Calcium: 9.2 mg/dL (ref 8.9–10.3)
Chloride: 98 mmol/L (ref 98–111)
Creatinine, Ser: 0.9 mg/dL (ref 0.61–1.24)
GFR calc Af Amer: >60 mL/min (ref >60)
GFR calc non Af Amer: >60 mL/min (ref >60)
Glucose, Bld: 169 mg/dL — ABNORMAL HIGH (ref 70–99)
Potassium: 5.1 mmol/L (ref 3.5–5.1)
Sodium: 136 mmol/L (ref 135–145)

## 2019-08-06 LAB — CBC
HCT: 36.2 % — ABNORMAL LOW (ref 39.0–52.0)
Hemoglobin: 12.2 g/dL — ABNORMAL LOW (ref 13.0–17.0)
MCH: 35.3 pg — ABNORMAL HIGH (ref 26.0–34.0)
MCHC: 33.7 g/dL (ref 30.0–36.0)
MCV: 104.6 fL — ABNORMAL HIGH (ref 80.0–100.0)
Platelets: 260 10*3/uL (ref 150–400)
RBC: 3.46 MIL/uL — ABNORMAL LOW (ref 4.22–5.81)
RDW: 18.9 % — ABNORMAL HIGH (ref 11.5–15.5)
WBC: 11.5 10*3/uL — ABNORMAL HIGH (ref 4.0–10.5)
nRBC: 0.4 % — ABNORMAL HIGH (ref 0.0–0.2)

## 2019-08-06 LAB — MAGNESIUM: Magnesium: 1.8 mg/dL (ref 1.7–2.4)

## 2019-08-07 LAB — POTASSIUM: Potassium: 4.8 mmol/L (ref 3.5–5.1)

## 2019-08-08 LAB — BASIC METABOLIC PANEL
Anion gap: 13 (ref 5–15)
BUN: 27 mg/dL — ABNORMAL HIGH (ref 8–23)
CO2: 28 mmol/L (ref 22–32)
Calcium: 9.2 mg/dL (ref 8.9–10.3)
Chloride: 96 mmol/L — ABNORMAL LOW (ref 98–111)
Creatinine, Ser: 1.07 mg/dL (ref 0.61–1.24)
GFR calc Af Amer: >60 mL/min (ref >60)
GFR calc non Af Amer: >60 mL/min (ref >60)
Glucose, Bld: 124 mg/dL — ABNORMAL HIGH (ref 70–99)
Potassium: 4.4 mmol/L (ref 3.5–5.1)
Sodium: 137 mmol/L (ref 135–145)

## 2019-08-08 LAB — CBC
HCT: 35.9 % — ABNORMAL LOW (ref 39.0–52.0)
Hemoglobin: 11.9 g/dL — ABNORMAL LOW (ref 13.0–17.0)
MCH: 35 pg — ABNORMAL HIGH (ref 26.0–34.0)
MCHC: 33.1 g/dL (ref 30.0–36.0)
MCV: 105.6 fL — ABNORMAL HIGH (ref 80.0–100.0)
Platelets: 309 10*3/uL (ref 150–400)
RBC: 3.4 MIL/uL — ABNORMAL LOW (ref 4.22–5.81)
RDW: 19.2 % — ABNORMAL HIGH (ref 11.5–15.5)
WBC: 9.3 10*3/uL (ref 4.0–10.5)
nRBC: 0.5 % — ABNORMAL HIGH (ref 0.0–0.2)

## 2019-08-08 LAB — PHOSPHORUS: Phosphorus: 4 mg/dL (ref 2.5–4.6)

## 2019-08-08 LAB — MAGNESIUM: Magnesium: 1.6 mg/dL — ABNORMAL LOW (ref 1.7–2.4)

## 2019-08-09 LAB — MAGNESIUM: Magnesium: 2.1 mg/dL (ref 1.7–2.4)

## 2019-08-10 LAB — NOVEL CORONAVIRUS, NAA (HOSP ORDER, SEND-OUT TO REF LAB; TAT 18-24 HRS): SARS-CoV-2, NAA: NOT DETECTED

## 2019-10-09 DEATH — deceased
